# Patient Record
Sex: Male | Born: 1950
Health system: Southern US, Community
[De-identification: ages and names within clinical notes are randomized; demographics above are authoritative.]

## PROBLEM LIST (undated history)

## (undated) DIAGNOSIS — E785 Hyperlipidemia, unspecified: Secondary | ICD-10-CM

## (undated) DIAGNOSIS — I213 ST elevation (STEMI) myocardial infarction of unspecified site: Secondary | ICD-10-CM

## (undated) DIAGNOSIS — R7989 Other specified abnormal findings of blood chemistry: Secondary | ICD-10-CM

## (undated) DIAGNOSIS — F4322 Adjustment disorder with anxiety: Secondary | ICD-10-CM

## (undated) DIAGNOSIS — F1020 Alcohol dependence, uncomplicated: Secondary | ICD-10-CM

## (undated) HISTORY — DX: ST elevation (STEMI) myocardial infarction of unspecified site: I21.3

## (undated) HISTORY — DX: Other specified abnormal findings of blood chemistry: R79.89

## (undated) HISTORY — DX: Adjustment disorder with anxiety: F43.22

## (undated) HISTORY — DX: Hyperlipidemia, unspecified: E78.5

## (undated) HISTORY — DX: Alcohol dependence, uncomplicated: F10.20

---

## 1998-12-04 ENCOUNTER — Ambulatory Visit (HOSPITAL_COMMUNITY): Admission: RE | Admit: 1998-12-04 | Discharge: 1998-12-04 | Payer: Self-pay | Admitting: Gastroenterology

## 2003-12-18 LAB — HM COLONOSCOPY

## 2005-02-26 ENCOUNTER — Emergency Department (HOSPITAL_COMMUNITY): Admission: EM | Admit: 2005-02-26 | Discharge: 2005-02-26 | Payer: Self-pay | Admitting: Family Medicine

## 2005-12-11 ENCOUNTER — Ambulatory Visit: Payer: Self-pay | Admitting: Internal Medicine

## 2006-04-02 ENCOUNTER — Ambulatory Visit: Payer: Self-pay | Admitting: Internal Medicine

## 2006-06-09 ENCOUNTER — Ambulatory Visit: Payer: Self-pay | Admitting: Internal Medicine

## 2006-07-17 ENCOUNTER — Ambulatory Visit: Payer: Self-pay | Admitting: Internal Medicine

## 2006-07-17 LAB — CONVERTED CEMR LAB
Chol/HDL Ratio, serum: 6.9
Cholesterol: 197 mg/dL (ref 0–200)
Triglyceride fasting, serum: 347 mg/dL (ref 0–149)

## 2007-06-16 ENCOUNTER — Ambulatory Visit: Payer: Self-pay | Admitting: Internal Medicine

## 2007-06-16 LAB — CONVERTED CEMR LAB
Blood in Urine, dipstick: NEGATIVE
Ketones, urine, test strip: NEGATIVE
Nitrite: NEGATIVE
Urobilinogen, UA: 0.2

## 2007-06-17 LAB — CONVERTED CEMR LAB
ALT: 36 units/L (ref 0–53)
AST: 29 units/L (ref 0–37)
Alkaline Phosphatase: 45 units/L (ref 39–117)
BUN: 13 mg/dL (ref 6–23)
Basophils Relative: 0.6 % (ref 0.0–1.0)
Bilirubin, Direct: 0.1 mg/dL (ref 0.0–0.3)
Calcium: 9.1 mg/dL (ref 8.4–10.5)
Chloride: 108 meq/L (ref 96–112)
Eosinophils Absolute: 0.1 10*3/uL (ref 0.0–0.6)
Eosinophils Relative: 1.5 % (ref 0.0–5.0)
GFR calc non Af Amer: 93 mL/min
Glucose, Bld: 90 mg/dL (ref 70–99)
HDL: 26.4 mg/dL — ABNORMAL LOW (ref >39.0)
MCV: 91.5 fL (ref 78.0–100.0)
Platelets: 184 10*3/uL (ref 150–400)
RBC: 4.7 M/uL (ref 4.22–5.81)
Triglycerides: 283 mg/dL (ref 0–149)
VLDL: 57 mg/dL — ABNORMAL HIGH (ref 0–40)
WBC: 3.9 10*3/uL — ABNORMAL LOW (ref 4.5–10.5)

## 2007-06-23 ENCOUNTER — Ambulatory Visit: Payer: Self-pay | Admitting: Internal Medicine

## 2007-06-23 DIAGNOSIS — E785 Hyperlipidemia, unspecified: Secondary | ICD-10-CM

## 2007-06-23 DIAGNOSIS — E786 Lipoprotein deficiency: Secondary | ICD-10-CM | POA: Insufficient documentation

## 2007-06-23 DIAGNOSIS — E781 Pure hyperglyceridemia: Secondary | ICD-10-CM

## 2007-09-03 HISTORY — PX: CATARACT EXTRACTION: SUR2

## 2007-09-17 ENCOUNTER — Ambulatory Visit: Payer: Self-pay | Admitting: Internal Medicine

## 2007-09-17 LAB — CONVERTED CEMR LAB
Cholesterol: 208 mg/dL (ref 0–200)
HDL: 26.5 mg/dL — ABNORMAL LOW (ref >39.0)
Total CHOL/HDL Ratio: 7.8
Triglycerides: 226 mg/dL (ref 0–149)
VLDL: 45 mg/dL — ABNORMAL HIGH (ref 0–40)

## 2007-09-25 ENCOUNTER — Ambulatory Visit: Payer: Self-pay | Admitting: Internal Medicine

## 2008-04-19 ENCOUNTER — Ambulatory Visit: Payer: Self-pay | Admitting: Family Medicine

## 2008-06-22 ENCOUNTER — Ambulatory Visit: Payer: Self-pay | Admitting: Internal Medicine

## 2008-06-22 LAB — CONVERTED CEMR LAB
ALT: 34 units/L (ref 0–53)
AST: 30 units/L (ref 0–37)
Basophils Absolute: 0 10*3/uL (ref 0.0–0.1)
Basophils Relative: 0.2 % (ref 0.0–3.0)
Bilirubin, Direct: 0.1 mg/dL (ref 0.0–0.3)
CO2: 32 meq/L (ref 19–32)
CRP, High Sensitivity: <1 — ABNORMAL LOW (ref 0.00–5.00)
Calcium: 9.4 mg/dL (ref 8.4–10.5)
Chloride: 104 meq/L (ref 96–112)
Creatinine, Ser: 0.8 mg/dL (ref 0.4–1.5)
Glucose, Bld: 86 mg/dL (ref 70–99)
Hemoglobin: 15 g/dL (ref 13.0–17.0)
LDL Cholesterol: 132 mg/dL — ABNORMAL HIGH (ref 0–99)
Lymphocytes Relative: 46.4 % — ABNORMAL HIGH (ref 12.0–46.0)
Monocytes Relative: 18.5 % — ABNORMAL HIGH (ref 3.0–12.0)
Neutro Abs: 1.3 10*3/uL — ABNORMAL LOW (ref 1.4–7.7)
Neutrophils Relative %: 34.2 % — ABNORMAL LOW (ref 43.0–77.0)
Nitrite: NEGATIVE
Protein, U semiquant: NEGATIVE
RBC: 4.66 M/uL (ref 4.22–5.81)
Sodium: 142 meq/L (ref 135–145)
Total Bilirubin: 0.7 mg/dL (ref 0.3–1.2)
Total CHOL/HDL Ratio: 6.4
Total Protein: 7.5 g/dL (ref 6.0–8.3)
Urobilinogen, UA: 0.2
VLDL: 31 mg/dL (ref 0–40)
WBC Urine, dipstick: NEGATIVE

## 2008-06-29 ENCOUNTER — Ambulatory Visit: Payer: Self-pay | Admitting: Internal Medicine

## 2008-06-29 DIAGNOSIS — F528 Other sexual dysfunction not due to a substance or known physiological condition: Secondary | ICD-10-CM | POA: Insufficient documentation

## 2008-08-08 ENCOUNTER — Ambulatory Visit: Payer: Self-pay | Admitting: Internal Medicine

## 2008-08-11 LAB — CONVERTED CEMR LAB
Basophils Absolute: 0 10*3/uL (ref 0.0–0.1)
Basophils Relative: 0.1 % (ref 0.0–3.0)
Eosinophils Absolute: 0 10*3/uL (ref 0.0–0.7)
Hemoglobin: 14.9 g/dL (ref 13.0–17.0)
MCHC: 35.2 g/dL (ref 30.0–36.0)
MCV: 92.9 fL (ref 78.0–100.0)
Monocytes Absolute: 0.9 10*3/uL (ref 0.1–1.0)
Neutro Abs: 1.7 10*3/uL (ref 1.4–7.7)
RBC: 4.55 M/uL (ref 4.22–5.81)
RDW: 12.5 % (ref 11.5–14.6)

## 2008-12-05 ENCOUNTER — Ambulatory Visit: Payer: Self-pay | Admitting: Internal Medicine

## 2009-01-04 ENCOUNTER — Ambulatory Visit: Payer: Self-pay | Admitting: Internal Medicine

## 2009-01-04 LAB — CONVERTED CEMR LAB
Albumin: 4 g/dL (ref 3.5–5.2)
Alkaline Phosphatase: 45 units/L (ref 39–117)
BUN: 16 mg/dL (ref 6–23)
Bilirubin Urine: NEGATIVE
CO2: 31 meq/L (ref 19–32)
Chloride: 107 meq/L (ref 96–112)
Cholesterol: 202 mg/dL — ABNORMAL HIGH (ref 0–200)
Creatinine, Ser: 0.9 mg/dL (ref 0.4–1.5)
Glucose, Bld: 84 mg/dL (ref 70–99)
HCT: 45.5 % (ref 39.0–52.0)
Hemoglobin, Urine: NEGATIVE
MCHC: 33.5 g/dL (ref 30.0–36.0)
MCV: 91.1 fL (ref 78.0–100.0)
Nitrite: NEGATIVE
Platelets: 122 10*3/uL — ABNORMAL LOW (ref 150.0–400.0)
Potassium: 4.1 meq/L (ref 3.5–5.1)
TSH: 4.19 microintl units/mL (ref 0.35–5.50)
Total Protein, Urine: NEGATIVE mg/dL
Total Protein: 7.8 g/dL (ref 6.0–8.3)
Urine Glucose: NEGATIVE mg/dL
Urobilinogen, UA: 0.2 (ref 0.0–1.0)
VLDL: 28.4 mg/dL (ref 0.0–40.0)
WBC: 4.1 10*3/uL — ABNORMAL LOW (ref 4.5–10.5)

## 2009-01-09 ENCOUNTER — Ambulatory Visit: Payer: Self-pay | Admitting: Internal Medicine

## 2009-01-12 ENCOUNTER — Encounter: Payer: Self-pay | Admitting: Internal Medicine

## 2009-01-12 LAB — HM COLONOSCOPY: HM Colonoscopy: NORMAL

## 2009-04-18 ENCOUNTER — Ambulatory Visit: Payer: Self-pay | Admitting: Internal Medicine

## 2009-04-26 ENCOUNTER — Inpatient Hospital Stay (HOSPITAL_COMMUNITY): Admission: EM | Admit: 2009-04-26 | Discharge: 2009-04-30 | Payer: Self-pay | Admitting: Emergency Medicine

## 2009-04-26 DIAGNOSIS — I213 ST elevation (STEMI) myocardial infarction of unspecified site: Secondary | ICD-10-CM

## 2009-04-26 HISTORY — DX: ST elevation (STEMI) myocardial infarction of unspecified site: I21.3

## 2009-04-26 HISTORY — PX: CORONARY ANGIOPLASTY WITH STENT PLACEMENT: SHX49

## 2009-05-02 ENCOUNTER — Inpatient Hospital Stay (HOSPITAL_COMMUNITY): Admission: EM | Admit: 2009-05-02 | Discharge: 2009-05-03 | Payer: Self-pay | Admitting: Emergency Medicine

## 2009-05-02 HISTORY — PX: CARDIAC CATHETERIZATION: SHX172

## 2009-05-03 HISTORY — PX: TRANSTHORACIC ECHOCARDIOGRAM: SHX275

## 2009-05-10 ENCOUNTER — Telehealth: Payer: Self-pay | Admitting: *Deleted

## 2009-05-10 LAB — CONVERTED CEMR LAB
HCT: 43.1 % (ref 39.0–52.0)
Hemoglobin: 14.7 g/dL (ref 13.0–17.0)
MCHC: 34 g/dL (ref 30.0–36.0)
Platelets: 126 10*3/uL — ABNORMAL LOW (ref 150.0–400.0)
RDW: 12.6 % (ref 11.5–14.6)

## 2009-05-18 ENCOUNTER — Encounter (HOSPITAL_COMMUNITY): Admission: RE | Admit: 2009-05-18 | Discharge: 2009-08-16 | Payer: Self-pay | Admitting: Cardiovascular Disease

## 2009-06-01 ENCOUNTER — Encounter: Payer: Self-pay | Admitting: Internal Medicine

## 2009-06-13 ENCOUNTER — Ambulatory Visit: Payer: Self-pay | Admitting: Internal Medicine

## 2009-06-13 DIAGNOSIS — F4322 Adjustment disorder with anxiety: Secondary | ICD-10-CM

## 2009-06-13 DIAGNOSIS — Z9861 Coronary angioplasty status: Secondary | ICD-10-CM

## 2009-06-13 DIAGNOSIS — I2510 Atherosclerotic heart disease of native coronary artery without angina pectoris: Secondary | ICD-10-CM

## 2009-06-13 HISTORY — DX: Adjustment disorder with anxiety: F43.22

## 2009-06-26 ENCOUNTER — Telehealth: Payer: Self-pay | Admitting: *Deleted

## 2009-06-27 ENCOUNTER — Telehealth: Payer: Self-pay | Admitting: *Deleted

## 2009-06-28 ENCOUNTER — Telehealth: Payer: Self-pay | Admitting: *Deleted

## 2009-07-13 ENCOUNTER — Encounter (INDEPENDENT_AMBULATORY_CARE_PROVIDER_SITE_OTHER): Payer: Self-pay | Admitting: *Deleted

## 2009-07-20 ENCOUNTER — Ambulatory Visit: Payer: Self-pay | Admitting: Internal Medicine

## 2009-08-14 ENCOUNTER — Encounter: Payer: Self-pay | Admitting: Internal Medicine

## 2009-08-15 ENCOUNTER — Ambulatory Visit: Payer: Self-pay | Admitting: Internal Medicine

## 2009-10-11 ENCOUNTER — Encounter (HOSPITAL_COMMUNITY): Admission: RE | Admit: 2009-10-11 | Discharge: 2010-01-09 | Payer: Self-pay | Admitting: Cardiovascular Disease

## 2009-10-18 ENCOUNTER — Ambulatory Visit: Payer: Self-pay | Admitting: Internal Medicine

## 2009-10-26 ENCOUNTER — Ambulatory Visit: Payer: Self-pay | Admitting: Psychology

## 2009-10-27 ENCOUNTER — Encounter: Payer: Self-pay | Admitting: Internal Medicine

## 2009-11-14 ENCOUNTER — Ambulatory Visit: Payer: Self-pay | Admitting: Psychology

## 2009-11-24 ENCOUNTER — Ambulatory Visit: Payer: Self-pay | Admitting: Internal Medicine

## 2009-11-24 LAB — CONVERTED CEMR LAB
AST: 34 units/L (ref 0–37)
Basophils Absolute: 0 10*3/uL (ref 0.0–0.1)
CO2: 30 meq/L (ref 19–32)
Chloride: 105 meq/L (ref 96–112)
HDL: 47.8 mg/dL (ref >39.00)
LDL Cholesterol: 51 mg/dL (ref 0–99)
Leukocytes, UA: NEGATIVE
Lymphocytes Relative: 38.6 % (ref 12.0–46.0)
Lymphs Abs: 1.3 10*3/uL (ref 0.7–4.0)
Monocytes Relative: 24.6 % — ABNORMAL HIGH (ref 3.0–12.0)
Neutrophils Relative %: 36.2 % — ABNORMAL LOW (ref 43.0–77.0)
Platelets: 100 10*3/uL — ABNORMAL LOW (ref 150.0–400.0)
RDW: 12.6 % (ref 11.5–14.6)
Sodium: 143 meq/L (ref 135–145)
Specific Gravity, Urine: <=1.005 (ref 1.000–1.030)
TSH: 1.85 microintl units/mL (ref 0.35–5.50)
Total Bilirubin: 1 mg/dL (ref 0.3–1.2)
Total CHOL/HDL Ratio: 2
Triglycerides: 57 mg/dL (ref 0.0–149.0)
Urine Glucose: NEGATIVE mg/dL
Urobilinogen, UA: 0.2 (ref 0.0–1.0)
VLDL: 11.4 mg/dL (ref 0.0–40.0)

## 2009-12-05 ENCOUNTER — Ambulatory Visit: Payer: Self-pay | Admitting: Psychology

## 2009-12-13 ENCOUNTER — Ambulatory Visit: Payer: Self-pay | Admitting: Internal Medicine

## 2009-12-13 DIAGNOSIS — R5381 Other malaise: Secondary | ICD-10-CM

## 2009-12-13 DIAGNOSIS — R5383 Other fatigue: Secondary | ICD-10-CM

## 2010-01-03 ENCOUNTER — Ambulatory Visit: Payer: Self-pay | Admitting: Psychology

## 2010-01-16 ENCOUNTER — Ambulatory Visit: Payer: Self-pay | Admitting: Internal Medicine

## 2010-01-16 LAB — CONVERTED CEMR LAB: Testosterone: 276.66 ng/dL — ABNORMAL LOW (ref 350.00–890.00)

## 2010-01-30 LAB — CONVERTED CEMR LAB
HCT: 45.5 % (ref 39.0–52.0)
Lymphs Abs: 1.4 10*3/uL (ref 0.7–4.0)
MCHC: 33.4 g/dL (ref 30.0–36.0)
MCV: 90.5 fL (ref 78.0–100.0)
Monocytes Relative: 25 % — ABNORMAL HIGH (ref 3–12)
Neutro Abs: 1.1 10*3/uL — ABNORMAL LOW (ref 1.7–7.7)
Neutrophils Relative %: 33 % — ABNORMAL LOW (ref 43–77)
RDW: 13.7 % (ref 11.5–15.5)

## 2010-01-31 ENCOUNTER — Telehealth: Payer: Self-pay | Admitting: *Deleted

## 2010-01-31 ENCOUNTER — Ambulatory Visit: Payer: Self-pay | Admitting: Internal Medicine

## 2010-01-31 ENCOUNTER — Encounter (HOSPITAL_COMMUNITY): Admission: RE | Admit: 2010-01-31 | Discharge: 2010-05-01 | Payer: Self-pay | Admitting: Cardiovascular Disease

## 2010-01-31 LAB — CONVERTED CEMR LAB
LH: 1.86 milliintl units/mL (ref 1.50–9.30)
Sex Hormone Binding: 27 nmol/L (ref 13–71)
Testosterone Free: 55.6 pg/mL (ref 47.0–244.0)
Testosterone: 256.4 ng/dL — ABNORMAL LOW (ref 350–890)

## 2010-02-01 ENCOUNTER — Ambulatory Visit: Payer: Self-pay | Admitting: Psychology

## 2010-02-05 ENCOUNTER — Ambulatory Visit: Payer: Self-pay | Admitting: Internal Medicine

## 2010-02-05 DIAGNOSIS — E291 Testicular hypofunction: Secondary | ICD-10-CM

## 2010-02-06 ENCOUNTER — Encounter: Payer: Self-pay | Admitting: Internal Medicine

## 2010-03-08 ENCOUNTER — Ambulatory Visit: Payer: Self-pay | Admitting: Psychology

## 2010-04-10 ENCOUNTER — Telehealth: Payer: Self-pay | Admitting: *Deleted

## 2010-04-12 ENCOUNTER — Encounter: Payer: Self-pay | Admitting: Internal Medicine

## 2010-04-13 ENCOUNTER — Ambulatory Visit: Payer: Self-pay | Admitting: Psychology

## 2010-04-13 ENCOUNTER — Ambulatory Visit: Payer: Self-pay | Admitting: Internal Medicine

## 2010-04-23 LAB — CONVERTED CEMR LAB
Basophils Absolute: 0 10*3/uL (ref 0.0–0.1)
Eosinophils Relative: 0.4 % (ref 0.0–5.0)
HCT: 43 % (ref 39.0–52.0)
Lymphs Abs: 1.6 10*3/uL (ref 0.7–4.0)
MCHC: 34 g/dL (ref 30.0–36.0)
MCV: 92 fL (ref 78.0–100.0)
Monocytes Absolute: 1 10*3/uL (ref 0.1–1.0)
Platelets: 93 10*3/uL — ABNORMAL LOW (ref 150.0–400.0)
RDW: 13.8 % (ref 11.5–14.6)
Vitamin B-12: 896 pg/mL (ref 211–911)

## 2010-04-24 ENCOUNTER — Ambulatory Visit: Payer: Self-pay | Admitting: Internal Medicine

## 2010-04-25 ENCOUNTER — Ambulatory Visit: Payer: Self-pay | Admitting: Internal Medicine

## 2010-05-03 ENCOUNTER — Encounter (HOSPITAL_COMMUNITY)
Admission: RE | Admit: 2010-05-03 | Discharge: 2010-08-01 | Payer: Self-pay | Source: Home / Self Care | Admitting: Cardiovascular Disease

## 2010-05-09 ENCOUNTER — Encounter: Payer: Self-pay | Admitting: Internal Medicine

## 2010-05-10 ENCOUNTER — Encounter: Payer: Self-pay | Admitting: Internal Medicine

## 2010-05-10 LAB — CBC WITH DIFFERENTIAL/PLATELET
BASO%: 0 % (ref 0.0–2.0)
Basophils Absolute: 0 10e3/uL (ref 0.0–0.1)
EOS%: 0.2 % (ref 0.0–7.0)
Eosinophils Absolute: 0 10e3/uL (ref 0.0–0.5)
HCT: 47.3 % (ref 38.4–49.9)
HGB: 16.1 g/dL (ref 13.0–17.1)
LYMPH%: 33.6 % (ref 14.0–49.0)
MCH: 30.6 pg (ref 27.2–33.4)
MCHC: 34 g/dL (ref 32.0–36.0)
MCV: 89.8 fL (ref 79.3–98.0)
MONO#: 0.9 10e3/uL (ref 0.1–0.9)
MONO%: 20.2 % — ABNORMAL HIGH (ref 0.0–14.0)
NEUT#: 2 10e3/uL (ref 1.5–6.5)
NEUT%: 46 % (ref 39.0–75.0)
Platelets: 95 10e3/uL — ABNORMAL LOW (ref 140–400)
RBC: 5.27 10e6/uL (ref 4.20–5.82)
RDW: 13.2 % (ref 11.0–14.6)
WBC: 4.3 10e3/uL (ref 4.0–10.3)
lymph#: 1.4 10e3/uL (ref 0.9–3.3)
nRBC: 0 % (ref 0–0)

## 2010-05-10 LAB — COMPREHENSIVE METABOLIC PANEL
CO2: 29 mEq/L (ref 19–32)
Calcium: 9.4 mg/dL (ref 8.4–10.5)
Chloride: 104 mEq/L (ref 96–112)
Creatinine, Ser: 0.93 mg/dL (ref 0.40–1.50)
Glucose, Bld: 114 mg/dL — ABNORMAL HIGH (ref 70–99)
Sodium: 141 mEq/L (ref 135–145)
Total Bilirubin: 0.6 mg/dL (ref 0.3–1.2)
Total Protein: 7.4 g/dL (ref 6.0–8.3)

## 2010-05-10 LAB — CHCC SMEAR

## 2010-05-16 ENCOUNTER — Ambulatory Visit: Payer: Self-pay | Admitting: Internal Medicine

## 2010-05-22 ENCOUNTER — Ambulatory Visit (HOSPITAL_COMMUNITY): Admission: RE | Admit: 2010-05-22 | Discharge: 2010-05-22 | Payer: Self-pay | Admitting: Oncology

## 2010-05-24 ENCOUNTER — Ambulatory Visit: Payer: Self-pay | Admitting: Psychology

## 2010-06-21 ENCOUNTER — Ambulatory Visit: Payer: Self-pay | Admitting: Psychology

## 2010-07-10 ENCOUNTER — Ambulatory Visit: Payer: Self-pay | Admitting: Internal Medicine

## 2010-07-12 ENCOUNTER — Encounter: Payer: Self-pay | Admitting: Internal Medicine

## 2010-07-12 LAB — CBC WITH DIFFERENTIAL/PLATELET
BASO%: 0 % (ref 0.0–2.0)
Basophils Absolute: 0 10*3/uL (ref 0.0–0.1)
EOS%: 0.5 % (ref 0.0–7.0)
HCT: 45.6 % (ref 38.4–49.9)
HGB: 15.4 g/dL (ref 13.0–17.1)
LYMPH%: 38.9 % (ref 14.0–49.0)
MCH: 30.4 pg (ref 27.2–33.4)
MCHC: 33.8 g/dL (ref 32.0–36.0)
MONO#: 0.9 10*3/uL (ref 0.1–0.9)
NEUT%: 36.7 % — ABNORMAL LOW (ref 39.0–75.0)
Platelets: 89 10*3/uL — ABNORMAL LOW (ref 140–400)

## 2010-08-02 ENCOUNTER — Encounter (HOSPITAL_COMMUNITY)
Admission: RE | Admit: 2010-08-02 | Discharge: 2010-10-02 | Payer: Self-pay | Source: Home / Self Care | Attending: Cardiovascular Disease | Admitting: Cardiovascular Disease

## 2010-08-15 ENCOUNTER — Encounter: Payer: Self-pay | Admitting: Internal Medicine

## 2010-08-18 DIAGNOSIS — E785 Hyperlipidemia, unspecified: Secondary | ICD-10-CM | POA: Insufficient documentation

## 2010-09-19 ENCOUNTER — Encounter: Payer: Self-pay | Admitting: Internal Medicine

## 2010-10-02 NOTE — Progress Notes (Signed)
Summary: Still having same sx. would like to move appts up if possible  Phone Note Call from Patient Call back at [phone]   Caller: Patient Summary of Call: Pt stating that he is still not feeling good. Still having the fatigue and same symptoms as back in June. Pt does have lab appointment at the end of Aug and follow up in Sept. Pt is going to have a stress test on 8.11. He is wanting to labs and rov moved up if possible. Pt is unsure if it could be stress or anxiety. But thinks that the androgel should have been working by now and he doesn't feel like it is. Initial call taken by: Romualdo Bolk, CMA Duncan Dull),  April 10, 2010 3:42 PM  Follow-up for Phone Call        ok to move up lab appt     Follow-up by: Madelin Headings MD,  April 11, 2010 11:13 AM  Additional Follow-up for Phone Call Additional follow up Details #1::        Left message on machine that Dr. Fabian Sharp said to move up appt.  Additional Follow-up by: Romualdo Bolk, CMA (AAMA),  April 11, 2010 2:02 PM

## 2010-10-02 NOTE — Assessment & Plan Note (Signed)
Summary: 5 month rov/njr   Vital Signs:  Patient profile:   60 year old male Weight:      190 pounds Pulse rate:   60 / minute BP sitting:   110 / 60  (left arm) Cuff size:   regular  Vitals Entered By: Romualdo Bolk, CMA (AAMA) (May 16, 2010 2:54 PM) CC: follow-up visit-Pt wants to discuss going on viibryol    History of Present Illness: Jon Carter comes in today    for follow up of problems  .  thrombocytopenia leukopenia:   now off  is off effient.    off a week  now.    had a normal stress test.    and to see if  laboratory abnormalities  continues.   [name] is pending  to check spleen.   Felt  blood changes could be from medications.   To follow .     Still tired     continues to exercise...   seeing Dr.  Dellia Cloud.    Sluggish   yesterday.   feels better today. A friend suggested he try a new antidepression medicine called velazodone.  he had difficulty with Lexapro and Niaspan with Flushing and nausea. His anxiety continues is using Clonopin with some help.  he gets twinges of pain in his chest that are probably musculoskeletal but it still bothers him and make shims anxious.   Preventive Screening-Counseling & Management  Alcohol-Tobacco     Alcohol drinks/day: <1     Alcohol type: beer, wine     Smoking Status: never  Caffeine-Diet-Exercise     Caffeine use/day: 2     Does Patient Exercise: yes     Type of exercise: wts, jogging, treadmill     Times/week: 7  Current Medications (verified): 1)  Clonazepam 1 Mg Tabs (Clonazepam) .... 1/2  By Mouth Two Times A Day As Needed 2)  Famotidine 20 Mg Tabs (Famotidine) .Marland Kitchen.. 1 By Mouth Qam 3)  Doxycycline Hyclate 100 Mg Tabs (Doxycycline Hyclate) .Marland Kitchen.. 1 By Mouth Once Daily- Every Other Day 4)  Metrogel 1 % Gel (Metronidazole) 5)  Aspirin 81 Mg Tbec (Aspirin) 6)  Niaspan 1000 Mg Cr-Tabs (Niacin (Antihyperlipidemic)) .Marland Kitchen.. 1 By Mouth At Bedtime 7)  Crestor 10 Mg Tabs (Rosuvastatin Calcium) .Marland Kitchen.. 1 By Mouth Once  Daily 8)  Nitrostat 0.4 Mg Subl (Nitroglycerin) 9)  Vitamin D3 1000 Unit Caps (Cholecalciferol) 10)  Co Q-10 150 Mg Caps (Coenzyme Q10) 11)  Androgel Pump 1 % Gel (Testosterone) .... 2 Pumps Per Day  Allergies (verified): 1)  Lexapro (Escitalopram Oxalate)  Past History:  Past medical, surgical, family and social histories (including risk factors) reviewed, and no changes noted (except as noted below).  Past Medical History: Reviewed history from 12/13/2009 and no changes required. Hyperlipidemia hx of low HDL  zero CAC in 2000 at [facility] see paper chart STEMI MI left circumflex DE stent   August 2010 nl stress test  Anxiety Rosacea        LAST Td: 10/09 Colonscopy: 5 years ago- due in April 2010 EKG: 06/08/06  CONSULTANTS  [facility]  Past Surgical History: Reviewed history from 06/29/2008 and no changes required. cataract left eye  [place]   2009  Past History:  Care Management: Dermatology: Dr. Emily Filbert Gastroenterology: [facility] Cardiology: [facility]  Dellia Cloud Hematology : [facility]  Family History: Reviewed history from 12/13/2009 and no changes required. see paper chart Family History of Alcoholism/Addiction Mom 70.s sudden death Brother  overweight    HBP   alcohol Father tongue cancer   tobacco     Social History: Reviewed history from 04/24/2010 and no changes required. 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    Physical Exam  General:  Well-developed,well-nourished,in no acute distress; alert,appropriate and cooperative throughout examination Head:  normocephalic and atraumatic.   Eyes:  vision grossly  intact and pupils equal.   Neck:  No deformities, masses, or tenderness noted. Lungs:  normal respiratory effort, no intercostal retractions, no accessory muscle use, and normal breath sounds.   Heart:  normal rate, regular rhythm, and no murmur.   Skin:  turgor normal, color normal, and no ecchymoses.   Cervical Nodes:  No lymphadenopathy noted Psych:  Oriented X3, memory intact for recent and remote, normally interactive, good eye contact, not depressed appearing, and slightly anxious.  looks a bit tired cognition appears to be normal as well as speech   Impression & Recommendations:  Problem # 1:  MALAISE AND FATIGUE (ICD-780.79) disc about velazodone a new med  still with serontonin  qualitites .    will check into drug interactions  the lexapro seemed to cause nausea and flushes wheil  on niaspan.     poss lonipen causing drowsiness .  consider change med in the future  to short active again as is doing better   Problem # 2:  LEUKOPENIA, MILD WITH BORDERLINE PLATELETS (ICD-288.50) posss med effect  as previously discussed. Hematology is following .  Problem # 3:  ADJUSTMENT DISORDER WITH ANXIETY (ICD-309.24) some possible depression secondary could be causing decrease in his energy he had a sec [place] side effect of the serotonin medication and the newer medication certainly have serotonin qualities however this may be an drug interaction will will have him discuss with [name] and will check on drug interactions for him although he is not on anywhere near as many medications as he was before.   Discuss  unknown risk of taking new medications on the market.  handout was given today  Problem # 4:  HYPOGONADISM (ICD-257.2) on 3 pumps per day .would be due for repeat laboratory test in November.  Complete Medication List: 1)  Clonazepam 1 Mg Tabs (Clonazepam) .... 1/2  by mouth two times a day as needed 2)  Famotidine 20 Mg Tabs (Famotidine) .Marland Kitchen.. 1 by mouth qam 3)  Doxycycline  Hyclate 100 Mg Tabs (Doxycycline hyclate) .Marland Kitchen.. 1 by mouth once daily- every other day 4)  Metrogel 1 % Gel (Metronidazole) 5)  Aspirin 81 Mg Tbec (Aspirin) 6)  Niaspan 1000 Mg Cr-tabs (Niacin (antihyperlipidemic)) .Marland Kitchen.. 1 by mouth at bedtime 7)  Crestor 10 Mg Tabs (Rosuvastatin calcium) .Marland Kitchen.. 1 by mouth once daily 8)  Nitrostat 0.4 Mg Subl (Nitroglycerin) 9)  Vitamin D3 1000 Unit Caps (Cholecalciferol) 10)  Co Q-10 150 Mg Caps (Coenzyme q10) 11)  Androgel Pump 1 % Gel (Testosterone) .... 2 pumps per day  Other Orders: Admin 1st Vaccine (16109) Flu Vaccine 14yrs + (60454)  Patient Instructions: 1)  meet with Dr Dellia Cloud about advisability about antidepressant.  2)   call and then can   call in med or  add med.  and plan follow up   Flu Vaccine Consent Questions     Do you have a history of severe allergic reactions to this vaccine? no    Any prior history of allergic reactions to egg and/or gelatin? no    Do you have a sensitivity to the preservative Thimersol? no    Do you have a past history of Guillan-Barre Syndrome? no    Do you currently have an acute febrile illness? no    Have you ever had a severe reaction to latex? no    Vaccine information given and explained to patient? yes    Are you currently pregnant? no    Lot Number:[id number]   Exp Date:03/02/2011   Site Given  Left Deltoid IMlu Romualdo Bolk, CMA (AAMA)  May 16, 2010 2:57 PM

## 2010-10-02 NOTE — Medication Information (Signed)
Summary: Coverage Approval for Androgel  Coverage Approval for Androgel   Imported By: Maryln Gottron 02/12/2010 10:48:23  _____________________________________________________________________  External Attachment:    Type:   Image     Comment:   External Document

## 2010-10-02 NOTE — Assessment & Plan Note (Signed)
Summary: ?ear inf/pain/cjr   Vital Signs:  Patient profile:   60 year old male Height:      71.25 inches Weight:      192 pounds BMI:     26.69 Temp:     98.3 degrees F oral Pulse rate:   66 / minute BP sitting:   120 / 80  (right arm) Cuff size:   regular  Vitals Entered By: Romualdo Bolk, CMA (AAMA) (October 18, 2009 11:19 AM) CC: Left ear soreness x 1.5 -2 weeks on the outside.   History of Present Illness: Jon Carter     comes  in today for   above  problem  .    about 2 weeks ago  he had insidious onset of a soreness of the left ear  external to touch and  then radiated down jaw line .   NO trauma fver or uri.Did have dental check as has implants on that side . tends to grind teeth also  . NO rash . Ocass uses q tips  no discharge or bleeding.  No cv pulm signs   Anxiety ;   began seeing Dr Dellia Cloud.   to help CAD : now off a lot of meds and continug to exercise.  Lipids : no change was worried as father  s tongue cancer presented as initial ear jaw pain.   Preventive Screening-Counseling & Management  Alcohol-Tobacco     Alcohol drinks/day: <1     Alcohol type: beer, wine     Smoking Status: never  Caffeine-Diet-Exercise     Caffeine use/day: 2     Does Patient Exercise: yes     Type of exercise: wts, jogging, treadmill     Times/week: 7  Current Medications (verified): 1)  Lovaza 1 Gm  Caps (Omega-3-Acid Ethyl Esters) .... 2 By Mouth Two Times A Day or As Directed 2)  Clonazepam 1 Mg Tabs (Clonazepam) .Marland Kitchen.. 1 By Mouth Two Times A Day 3)  Famotidine 20 Mg Tabs (Famotidine) .Marland Kitchen.. 1 By Mouth Qam 4)  Effient 10 Mg Tabs (Prasugrel Hcl) .Marland Kitchen.. 1 By Mouth Qam 5)  Doxycycline Hyclate 100 Mg Tabs (Doxycycline Hyclate) .Marland Kitchen.. 1 By Mouth Once Daily 6)  Metrogel 1 % Gel (Metronidazole) 7)  Aspirin 81 Mg Tbec (Aspirin) 8)  Niaspan 500 Mg Cr-Tabs (Niacin (Antihyperlipidemic)) .... 2  By Mouth At Bedtime 9)  Crestor 10 Mg Tabs (Rosuvastatin Calcium) .Marland Kitchen.. 1 By Mouth Once  Daily 10)  Nitrostat 0.4 Mg Subl (Nitroglycerin) 11)  Multivitamins   Tabs (Multiple Vitamin) 12)  Vitamin D3 1000 Unit Caps (Cholecalciferol)  Allergies (verified): 1)  Lexapro (Escitalopram Oxalate)  Past History:  Past medical, surgical, family and social histories (including risk factors) reviewed, and no changes noted (except as noted below).  Past Medical History: Reviewed history from 06/13/2009 and no changes required. Hyperlipidemia see paper chart STEMI MI left circumflex DE stent  2010 Anxiety       LAST Td: 10/09 Colonscopy: 5 years ago- due in April 2010 EKG: 06/08/06  [facility]ULT[facility]  [facility]  Past Surgical History: Reviewed history from 06/29/2008 and no changes required. cataract left eye  [place]   2009  Past History:  Care Management: Dermatology: Dr. Emily Filbert Gastroenterology: [facility] Cardiology: [facility]  Dellia Cloud  Family History: Reviewed history from 06/29/2008 and no changes required. see paper chart Family History of Alcoholism/Addiction Mom 70.s sudden death Brother   overweight    HBP   alcohol Father tongue cancer  tobacco    Social History: Reviewed history from 12/05/2008 and no changes required. 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    Physical Exam  General:  Well-developed,well-nourished,in no acute distress; alert,appropriate and cooperative throughout examination Head:  normocephalic and atraumatic.   Eyes:  vision grossly intact, pupils equal, and pupils round.   Ears:  tms clear   left eac minimal redness no rassh or fb  slight tenderness at  tragus  pull and also at tmj but no click .  pinna slightly red but no edema or rash  poss from  manipulation Nose:  no external deformity, no external erythema, and no nasal discharge.   Mouth:  pharynx pink and moist.   Neck:  No deformities, masses, or tenderness noted. no bruits   Lungs:  normal respiratory effort and no intercostal retractions.   Heart:  normal rate, regular rhythm, and no murmur.   Neurologic:  alert & oriented X3, cranial nerves II-XII intact, strength normal in all extremities, and gait normal.   Cervical Nodes:  no anterior cervical adenopathy and no posterior cervical adenopathy.   Psych:  Oriented X3, good eye contact, not depressed appearing, and slightly anxious.   nl cognition     Impression & Recommendations:  Problem # 1:  OTALGIA (ICD-388.70)  suspect referred  poss tmj or could have had a mild esternla canal irritation  with q tips .   Do not think this is vascular .     and no other findings.    suggest   relative jaw rest and cautious aleve short term .     if persistent and progressive     .  can see dentisit   and then  [name] as needed.   His updated medication list for this problem includes:    Doxycycline Hyclate 100 Mg Tabs (Doxycycline hyclate) .Marland Kitchen... 1 by mouth once daily  Problem # 2:  ADJUSTMENT DISORDER WITH ANXIETY (ICD-309.24) seeing Dr Dellia Cloud and encouraged to continue counseled   Problem # 3:  CAD (ICD-414.00) doing well  in exercise and rehab.  The following medications were removed from the medication list:    Bystolic 5 Mg Tabs (Nebivolol hcl) .Marland Kitchen... 1 by mouth two times a day His updated medication list for this problem includes:    Effient 10 Mg Tabs (Prasugrel hcl) .Marland Kitchen... 1 by mouth qam    Aspirin 81 Mg Tbec (Aspirin)    Nitrostat 0.4 Mg Subl (Nitroglycerin)  Problem # 4:  HYPERLIPIDEMIA (ICD-272.4)  His updated medication list for this problem includes:    Lovaza 1 Gm Caps (Omega-3-acid ethyl esters) .Marland Kitchen... 2 by mouth two times a day or as directed    Niaspan 500 Mg Cr-tabs  (Niacin (antihyperlipidemic)) .Marland Kitchen... 2  by mouth at bedtime    Crestor 10 Mg Tabs (Rosuvastatin calcium) .Marland Kitchen... 1 by mouth once daily  Labs Reviewed: SGOT: 34 (01/04/2009)   SGPT: 41 (01/04/2009)   HDL:31.40 (01/04/2009), 30.2 (06/22/2008)  LDL:132 (06/22/2008), DEL (09/17/2007)  Chol:202 (01/04/2009), 193 (06/22/2008)  Trig:142.0 (01/04/2009), 155 (06/22/2008)  Complete Medication List: 1)  Lovaza 1 Gm Caps (Omega-3-acid ethyl esters) .... 2 by mouth two times a day or as directed 2)  Clonazepam 1 Mg Tabs (Clonazepam) .Marland Kitchen.. 1 by mouth two times a day 3)  Famotidine 20 Mg Tabs (Famotidine) .Marland Kitchen.. 1 by mouth qam 4)  Effient 10 Mg Tabs (Prasugrel hcl) .Marland Kitchen.. 1 by mouth qam 5)  Doxycycline Hyclate 100 Mg Tabs (Doxycycline hyclate) .Marland Kitchen.. 1 by mouth once daily 6)  Metrogel 1 % Gel (Metronidazole) 7)  Aspirin 81 Mg Tbec (Aspirin) 8)  Niaspan 500 Mg Cr-tabs (Niacin (antihyperlipidemic)) .... 2  by mouth at bedtime 9)  Crestor 10 Mg Tabs (Rosuvastatin calcium) .Marland Kitchen.. 1 by mouth once daily 10)  Nitrostat 0.4 Mg Subl (Nitroglycerin) 11)  Multivitamins Tabs (Multiple vitamin) 12)  Vitamin D3 1000 Unit Caps (Cholecalciferol)  Patient Instructions: 1)  monitor signs and advil aleve  jaw rest  2)  consider follow up with dentist or then [name] or if rash  or persistent and progressive  signs.  greater than 50% of visit spent in counseling   25 minutes

## 2010-10-02 NOTE — Assessment & Plan Note (Signed)
Summary: cpx/njr rsc bmp/njr   Vital Signs:  Patient profile:   60 year old male Height:      71.25 inches Weight:      186 pounds Pulse rate:   60 / minute BP sitting:   110 / 70  (left arm) Cuff size:   regular  Vitals Entered By: Romualdo Bolk, CMA Duncan Dull) (December 13, 2009 2:21 PM) CC: CPX   History of Present Illness: Jon Carter comes in today for   preventive visit . Since last visit  here  there have been no major changes in health status  has been doing ok.  in counseling and less anxious and more confident .   continues  exercise and is  Down to clonazepan to 1/2 two times a day doing better.  considering other wean.   CAD: no signs to see Dr Tresa Endo in August . no signs  Few bruisis on meds but no active bleeding  fever sweats .  Eye rosacea doing ok.  Began co q  10 for energy and hear t health rec by neighbor.   Preventive Care Screening  Colonoscopy:    Date:  01/12/2009    Results:  normal   Prior Values:    PSA:  0.53 (11/24/2009)    Last Tetanus Booster:  Tdap (04/19/2008)    Last Flu Shot:  Fluvax Non-MCR (06/23/2007)   Preventive Screening-Counseling & Management  Alcohol-Tobacco     Alcohol drinks/day: <1     Alcohol type: beer, wine     Smoking Status: never  Caffeine-Diet-Exercise     Caffeine use/day: 2     Does Patient Exercise: yes     Type of exercise: wts, jogging, treadmill     Times/week: 7  Hep-HIV-STD-Contraception     Dental Visit-last 6 months yes     Sun Exposure-Excessive: no  Safety-Violence-Falls     Seat Belt Use: 100     Smoke Detectors: yes  Current Medications (verified): 1)  Clonazepam 1 Mg Tabs (Clonazepam) .... 1/2  By Mouth Two Times A Day 2)  Famotidine 20 Mg Tabs (Famotidine) .Marland Kitchen.. 1 By Mouth Qam 3)  Effient 10 Mg Tabs (Prasugrel Hcl) .Marland Kitchen.. 1 By Mouth Qam 4)  Doxycycline Hyclate 100 Mg Tabs (Doxycycline Hyclate) .Marland Kitchen.. 1 By Mouth Once Daily 5)  Metrogel 1 % Gel (Metronidazole) 6)  Aspirin 81 Mg Tbec  (Aspirin) 7)  Niaspan 500 Mg Cr-Tabs (Niacin (Antihyperlipidemic)) .... 2  By Mouth At Bedtime 8)  Crestor 10 Mg Tabs (Rosuvastatin Calcium) .Marland Kitchen.. 1 By Mouth Once Daily 9)  Nitrostat 0.4 Mg Subl (Nitroglycerin) 10)  Multivitamins   Tabs (Multiple Vitamin) 11)  Vitamin D3 1000 Unit Caps (Cholecalciferol) 12)  Co Q-10 150 Mg Caps (Coenzyme Q10)  Allergies (verified): 1)  Lexapro (Escitalopram Oxalate)  Past History:  Past medical, surgical, family and social histories (including risk factors) reviewed, and no changes noted (except as noted below).  Past Medical History: Hyperlipidemia hx of low HDL  [facility] in 2000 [facility] see paper chart STEMI MI left circumflex DE stent   August 2010 nl stress test  Anxiety Rosacea        LAST Td: 10/09 Colonscopy: 5 years ago- due in April 2010 EKG: 06/08/06  CONSULTANTS  Elmon Else derm [facility]off  Past Surgical History: Reviewed history from 06/29/2008 and no changes required. cataract left eye  [place]   2009  Past History:  Care Management: Dermatology: Dr. Emily Filbert Gastroenterology: [facility] Cardiology: Tresa Endo Psychology  [place]  Family History: Reviewed history from 10/18/2009 and no changes required. see paper chart Family History of Alcoholism/Addiction Mom 70.s sudden death Brother   overweight    HBP   alcohol Father tongue cancer   tobacco     Social History: Reviewed history from 10/18/2009 and no changes required. 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         ocass left jaw pain pss from muscle and teeht grinding   doing better  no obs prostate symptom some  fatigue   .  ? cause  not worse   Physical Exam General Appearance: well developed, well nourished, no acute distress Eyes: conjunctiva and lids normal, PERRLA, EOMI, fundi WNL Ears, Nose, Mouth, Throat: TM clear, nares clear, oral exam WNL Neck: supple, no lymphadenopathy, no thyromegaly, no JVD Respiratory: clear to auscultation and percussion, respiratory effort normal Cardiovascular: regular rate and rhythm, S1-S2, no murmur, rub or gallop, no bruits, peripheral pulses normal and symmetric, no cyanosis, clubbing, edema or varicosities Chest: no scars, masses, tenderness; no asymmetry, skin changes, nipple discharge, no gynecomastia   Gastrointestinal: soft, non-tender; no hepatosplenomegaly, masses; active bowel sounds all quadrants, guaiac negative stool; no masses, tenderness, hemorrhoids  Genitourinary: 1 + no nodule   prostate enlargement Lymphatic: no cervical, axillary or inguinal adenopathy Musculoskeletal: gait normal, muscle tone and strength WNL, no joint swelling, effusions, discoloration, crepitus  Skin: clear, good turgor, color WNL, no rashes, lesions, or ulcerations  one bruise left arm no petechia  Neurologic: normal mental status, normal reflexes, normal strength, sensation, and motion Psychiatric: alert; oriented to person, place and time Other Exam: see labs  EKG done by cards     Impression & Recommendations:  Problem # 1:  PREVENTIVE HEALTH CARE (ICD-V70.0) continue healthy lifestyle intervention     doing well  pneumovax  today   Problem # 2:  ADJUSTMENT DISORDER WITH ANXIETY (ICD-309.24) Assessment: Improved in counseling and adapting.   like ptsd and coping  Problem # 3:  CAD  (ICD-414.00) Assessment: Unchanged  His updated medication list for this problem includes:    Effient 10 Mg Tabs (Prasugrel hcl) .Marland Kitchen... 1 by mouth qam    Aspirin 81 Mg Tbec (Aspirin)    Nitrostat 0.4 Mg Subl (Nitroglycerin)  Problem # 4:  POSTSURG PERCUT TRANSLUMINAL COR ANGPLSTY STS (ICD-V45.82)  His updated medication list for this problem includes:    Effient 10 Mg Tabs (Prasugrel hcl) .Marland Kitchen... 1 by mouth qam    Aspirin 81 Mg Tbec (Aspirin)    Nitrostat 0.4 Mg Subl (Nitroglycerin)  Problem # 5:  LEUKOPENIA, MILD WITH BORDERLINE PLATELETS (ICD-288.50) poss from the medication  no symptom at present. renal function good and ANC 1160 and adequate.   Problem # 6:  MALAISE AND FATIGUE (ICD-780.79) better  and has many causes but asks about getting a testosterone level    . can do this with next blood test.   Problem # 7:  HYPERLIPIDEMIA (  ICD-272.4)  The following medications were removed from the medication list:    Lovaza 1 Gm Caps (Omega-3-acid ethyl esters) .Marland Kitchen... 2 by mouth two times a day or as directed His updated medication list for this problem includes:    Niaspan 500 Mg Cr-tabs (Niacin (antihyperlipidemic)) .Marland Kitchen... 2  by mouth at bedtime    Crestor 10 Mg Tabs (Rosuvastatin calcium) .Marland Kitchen... 1 by mouth once daily  Complete Medication List: 1)  Clonazepam 1 Mg Tabs (Clonazepam) .... 1/2  by mouth two times a day 2)  Famotidine 20 Mg Tabs (Famotidine) .Marland Kitchen.. 1 by mouth qam 3)  Effient 10 Mg Tabs (Prasugrel hcl) .Marland Kitchen.. 1 by mouth qam 4)  Doxycycline Hyclate 100 Mg Tabs (Doxycycline hyclate) .Marland Kitchen.. 1 by mouth once daily 5)  Metrogel 1 % Gel (Metronidazole) 6)  Aspirin 81 Mg Tbec (Aspirin) 7)  Niaspan 500 Mg Cr-tabs (Niacin (antihyperlipidemic)) .... 2  by mouth at bedtime 8)  Crestor 10 Mg Tabs (Rosuvastatin calcium) .Marland Kitchen.. 1 by mouth once daily 9)  Nitrostat 0.4 Mg Subl (Nitroglycerin) 10)  Multivitamins Tabs (Multiple vitamin) 11)  Vitamin D3 1000 Unit Caps (Cholecalciferol) 12)  Co Q-10  150 Mg Caps (Coenzyme q10)  Other Orders: Pneumococcal Vaccine (24401) Admin 1st Vaccine (02725)  Patient Instructions: 1)  Continue healthy lifestyle .  2)  recheck cbc  diff platelets , AM   testosterone in 1-2 months   follow up as needed . 3)  otherwise .  dx  . 288. , fatigue  4)  ROV  after you see Dr Tresa Endo in August or as needed.    Immunizations Administered:  Pneumonia Vaccine:    Vaccine Type: Pneumovax    Site: right deltoid    Mfr: Merck    Dose: 0.5 ml    Route: IM    Given by: Romualdo Bolk, CMA (AAMA)    Exp. Date: 04/07/2011    Lot #: [id number]

## 2010-10-02 NOTE — Letter (Signed)
Summary: [facility]  [facility]   Imported By: Maryln Gottron 05/30/2010 13:09:18  _____________________________________________________________________  External Attachment:    Type:   Image     Comment:   External Document

## 2010-10-02 NOTE — Assessment & Plan Note (Signed)
Summary: follow up/ssc   Vital Signs:  Patient profile:   60 year old male Weight:      187 pounds Pulse rate:   60 / minute BP sitting:   130 / 80  (right arm) Cuff size:   regular  Vitals Entered By: Romualdo Bolk, CMA (AAMA) (February 05, 2010 11:01 AM) CC: Follow-up visit on labs    History of Present Illness: Jon Carter comes in today  for follow up of labs done as a results of his multiple medical problems and fatigue and sleepiness   n the day.  He is unclear if all his fatigue if from stress and rcovering from heart attack or other. He is now seeing Dr Dellia Cloud in counseling with help.   Cardiac rehab  as expected. No new signs .    Preventive Screening-Counseling & Management  Alcohol-Tobacco     Alcohol drinks/day: <1     Alcohol type: beer, wine     Smoking Status: never  Caffeine-Diet-Exercise     Caffeine use/day: 2     Does Patient Exercise: yes     Type of exercise: wts, jogging, treadmill     Times/week: 7  Current Medications (verified): 1)  Clonazepam 1 Mg Tabs (Clonazepam) .... 1/2  By Mouth Two Times A Day As Needed 2)  Famotidine 20 Mg Tabs (Famotidine) .Marland Kitchen.. 1 By Mouth Qam 3)  Effient 10 Mg Tabs (Prasugrel Hcl) .Marland Kitchen.. 1 By Mouth Qam 4)  Doxycycline Hyclate 100 Mg Tabs (Doxycycline Hyclate) .Marland Kitchen.. 1 By Mouth Once Daily- Every Other Day 5)  Metrogel 1 % Gel (Metronidazole) 6)  Aspirin 81 Mg Tbec (Aspirin) 7)  Niaspan 1000 Mg Cr-Tabs (Niacin (Antihyperlipidemic)) .Marland Kitchen.. 1 By Mouth At Bedtime 8)  Crestor 10 Mg Tabs (Rosuvastatin Calcium) .Marland Kitchen.. 1 By Mouth Once Daily 9)  Nitrostat 0.4 Mg Subl (Nitroglycerin) 10)  Boost  Liqd (Nutritional Supplements) 11)  Vitamin D3 1000 Unit Caps (Cholecalciferol) 12)  Co Q-10 150 Mg Caps (Coenzyme Q10)  Allergies (verified): 1)  Lexapro (Escitalopram Oxalate)  Past History:  Past medical, surgical, family and social histories (including risk factors) reviewed, and no changes noted (except as noted below).  Past  Medical History: Reviewed history from 12/13/2009 and no changes required. Hyperlipidemia hx of low HDL  zero [facility] in 2000 at [facility] see paper chart STEMI MI left circumflex DE stent   August 2010 nl stress test  Anxiety Rosacea        LAST Td: 10/09 Colonscopy: 5 years ago- due in April 2010 EKG: 06/08/06  CONSULTANTS  El[facility] Else derm [facility]  Past Surgical History: Reviewed history from 06/29/2008 and no changes required. cataract left eye  [place]   2009  Past History:  Care Management: Dermatology: Dr. Emily Filbert Gastroenterology: Kinnie Scales Cardiology: Tresa Endo Psychology  Dellia Cloud  Family History: Reviewed history from 12/13/2009 and no changes required. see paper chart Family History of Alcoholism/Addiction Mom 70.s sudden death Brother   overweight    HBP   alcohol Father tongue cancer   tobacco     Social History: Reviewed history from 12/13/2009 and no changes required. 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    Physical Exam  General:  Well-developed,well-nourished,in no acute distress; alert,appropriate and cooperative throughout examination Head:  normocephalic and atraumatic.   Psych:  Oriented X3, good eye contact, and not depressed appearing.  mmildly anxious more relaxed    Impression & Recommendations:  Problem # 1:  HYPOGONADISM (ICD-257.2) Assessment New with signs of fatigue .  nl weight and no opiates or drugs related . no neuro findings .   optin to rx and will begin with androgel topicals  lower dose and follow up ( he is going out of town soon.   Problem #  2:  MALAISE AND FATIGUE (ICD-780.79) Assessment: Comment Only  Problem # 3:  MYOCARDIAL INFARCTION, HX OFSTEMI (ICD-412)  His updated medication list for this problem includes:    Effient 10 Mg Tabs (Prasugrel hcl) .Marland Kitchen... 1 by mouth qam    Aspirin 81 Mg Tbec (Aspirin)    Nitrostat 0.4 Mg Subl (Nitroglycerin)  Problem # 4:  ADJUSTMENT DISORDER WITH ANXIETY (ICD-309.24) Assessment: Comment Only  Problem # 5:  ERECTILE DYSFUNCTION, MILD (ICD-302.72) Assessment: Comment Only  Complete Medication List: 1)  Clonazepam 1 Mg Tabs (Clonazepam) .... 1/2  by mouth two times a day as needed 2)  Famotidine 20 Mg Tabs (Famotidine) .Marland Kitchen.. 1 by mouth qam 3)  Effient 10 Mg Tabs (Prasugrel hcl) .Marland Kitchen.. 1 by mouth qam 4)  Doxycycline Hyclate 100 Mg Tabs (Doxycycline hyclate) .Marland Kitchen.. 1 by mouth once daily- every other day 5)  Metrogel 1 % Gel (Metronidazole) 6)  Aspirin 81 Mg Tbec (Aspirin) 7)  Niaspan 1000 Mg Cr-tabs (Niacin (antihyperlipidemic)) .Marland Kitchen.. 1 by mouth at bedtime 8)  Crestor 10 Mg Tabs (Rosuvastatin calcium) .Marland Kitchen.. 1 by mouth once daily 9)  Nitrostat 0.4 Mg Subl (Nitroglycerin) 10)  Boost Liqd (Nutritional supplements) 11)  Vitamin D3 1000 Unit Caps (Cholecalciferol) 12)  Co Q-10 150 Mg Caps (Coenzyme q10) 13)  Androgel Pump 1 % Gel (Testosterone) .... 2 pumps per day  Patient Instructions: 1)  begin androgel  daily   2)  Testosterone level, PSA,  CBC diff , B12 Folic acid level 3)  in 3 months and then ROV.  Prescriptions: ANDROGEL PUMP 1 % GEL (TESTOSTERONE) 2 pumps per day  #1 month x 3   Entered and Authorized by:   Madelin Headings MD   Signed by:   Madelin Headings MD on 02/05/2010   Method used:   Print then Give to Patient   RxID:   [id number]  greater than 50% of visit spent in counseling  25 minutes

## 2010-10-02 NOTE — Letter (Signed)
Summary: [facility]  [facility]   Imported By: Maryln Gottron 06/13/2010 10:27:18  _____________________________________________________________________  External Attachment:    Type:   Image     Comment:   External Document

## 2010-10-02 NOTE — Miscellaneous (Signed)
Summary: [facility]  [facility]   Imported By: Maryln Gottron 09/22/2009 15:21:00  _____________________________________________________________________  External Attachment:    Type:   Image     Comment:   External Document

## 2010-10-02 NOTE — Progress Notes (Signed)
Summary: lab results  Phone Note Call from Patient Call back at Home Phone [phone] Call back at [phone]   Caller: Patient Summary of Call: Pt is going to be going out of town in 2 weeks. He would like to have a follow up lab appt next week if he needs one. Can we go ahead and schedule this or do you want to wait? Initial call taken by: Romualdo Bolk, CMA (AAMA),  January 31, 2010 5:12 PM  Follow-up for Phone Call        results not all back but go ahead and put him on the schedule for next week. Follow-up by: Madelin Headings MD,  February 01, 2010 7:43 AM  Additional Follow-up for Phone Call Additional follow up Details #1::        Left message to call back to confirm appt on 6/6 at 11am. Additional Follow-up by: Romualdo Bolk, CMA Duncan Dull),  February 01, 2010 9:42 AM    Additional Follow-up for Phone Call Additional follow up Details #2::    Pt called back and confirmed appt for monday. Follow-up by: Romualdo Bolk, CMA (AAMA),  February 02, 2010 9:52 AM

## 2010-10-02 NOTE — Letter (Signed)
Summary: [facility]  [facility]   Imported By: Maryln Gottron 07/20/2010 14:26:05  _____________________________________________________________________  External Attachment:    Type:   Image     Comment:   External Document

## 2010-10-02 NOTE — Assessment & Plan Note (Signed)
Summary: to discuss low platelet/njr   Vital Signs:  Patient profile:   60 year old male Weight:      193 pounds Pulse rate:   60 / minute BP sitting:   100 / 70  (left arm) Cuff size:   regular  Vitals Entered By: Romualdo Bolk, CMA Duncan Dull) (April 24, 2010 1:38 PM) CC: Follow-up visit on labs   History of Present Illness: Jon Carter comes in today   walked in  emergently   for panic over  blood count readings and  recc about getting hematology consult .     Anxiety has been better  but  having a hard time with this news.   exercising no fever sweats unintended weight loss.  no unusual bruising .  stil on effient    and tends to bruise easier on extremities with sig contacts but non on trunk  etc.   May be going off   the effieint  as its close to a  year of rx .   Had stress test recently and apparently ok . to see Cards soon.  Does have some fatigue and ? if any  change with the androgel.    Preventive Screening-Counseling & Management  Alcohol-Tobacco     Alcohol drinks/day: <1     Alcohol type: beer, wine     Smoking Status: never  Caffeine-Diet-Exercise     Caffeine use/day: 2     Does Patient Exercise: yes     Type of exercise: wts, jogging, treadmill     Times/week: 7  Current Medications (verified): 1)  Clonazepam 1 Mg Tabs (Clonazepam) .... 1/2  By Mouth Two Times A Day As Needed 2)  Famotidine 20 Mg Tabs (Famotidine) .Marland Kitchen.. 1 By Mouth Qam 3)  Effient 10 Mg Tabs (Prasugrel Hcl) .Marland Kitchen.. 1 By Mouth Qam 4)  Doxycycline Hyclate 100 Mg Tabs (Doxycycline Hyclate) .Marland Kitchen.. 1 By Mouth Once Daily- Every Other Day 5)  Metrogel 1 % Gel (Metronidazole) 6)  Aspirin 81 Mg Tbec (Aspirin) 7)  Niaspan 1000 Mg Cr-Tabs (Niacin (Antihyperlipidemic)) .Marland Kitchen.. 1 By Mouth At Bedtime 8)  Crestor 10 Mg Tabs (Rosuvastatin Calcium) .Marland Kitchen.. 1 By Mouth Once Daily 9)  Nitrostat 0.4 Mg Subl (Nitroglycerin) 10)  Boost  Liqd (Nutritional Supplements) 11)  Vitamin D3 1000 Unit Caps  (Cholecalciferol) 12)  Co Q-10 150 Mg Caps (Coenzyme Q10) 13)  Androgel Pump 1 % Gel (Testosterone) .... 2 Pumps Per Day  Allergies (verified): 1)  Lexapro (Escitalopram Oxalate)  Past History:  Past medical, surgical, family and social histories (including risk factors) reviewed, and no changes noted (except as noted below).  Past Medical History: Reviewed history from 12/13/2009 and no changes required. Hyperlipidemia hx of low HDL  zero [facility] in 2000 at [facility] see paper chart STEMI MI left circumflex DE stent   August 2010 nl stress test  Anxiety Rosacea        LAST Td: 10/09 Colonscopy: 5 years ago- due in April 2010 EKG: 06/08/06  CONSULTANTS  Elmon Else derm [facility]  Past Surgical History: Reviewed history from 06/29/2008 and no changes required. cataract left eye  [place]   2009  Past History:  Care Management: Dermatology: Dr. Emily Filbert Gastroenterology: Kinnie Scales Cardiology: Tresa Endo Psychology  Dellia Cloud  Family History: Reviewed history from 12/13/2009 and no changes required. see paper chart Family History of Alcoholism/Addiction Mom 70.s sudden death Brother   overweight    HBP   alcohol Father tongue cancer   tobacco  Social History: Reviewed history from 12/13/2009 and no changes required. 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    Physical Exam  General:  Well-developed,well-nourished,in no acute distress; alert,appropriate and cooperative throughout examination  midly anxious   looks well  Head:  normocephalic and atraumatic.    Eyes:  vision grossly intact.   Ears:  no external deformities.   Mouth:  pharynx pink and moist.   Neck:  No deformities, masses, or tenderness noted. Lungs:  Normal respiratory effort, chest expands symmetrically. Lungs are clear to auscultation, no crackles or wheezes. Heart:  Normal rate and regular rhythm. S1 and S2 normal without gallop, murmur, click, rub or other extra sounds. Abdomen:  Bowel sounds positive,abdomen soft and non-tender without masses, organomegaly or   noted. Extremities:  no clubbing cyanosis or edema  Neurologic:  non focal  Skin:  turgor normal, color normal, no petechiae, and no purpura.  few faded supserficial bruise on lower extremity ( green color)  no hematomas and no petechia  Cervical Nodes:  No lymphadenopathy noted Axillary Nodes:  No palpable lymphadenopathy Inguinal Nodes:  No significant adenopathy Psych:  Oriented X3, good eye contact, not depressed appearing, and moderately anxious.   nnl cognition and speech   Impression & Recommendations:  Problem # 1:  LEUKOPENIA, MILD WITH BORDERLINE PLATELETS (ICD-288.50) Assessment Deteriorated lower count now no symptoms spenomegaly or adenopathy ...    reviewed old readings he had and had lower wbc pre effient .      hemconsult already pending.    Problem # 2:  ADJUSTMENT DISORDER WITH ANXIETY (ICD-309.24) ongoing   was improving but this urgent appt shows his angst is still at work .    counseled  and continue  . is functiong well however and continuing counseling   Problem # 3:  HYPOGONADISM (ICD-257.2) increase adrogel to 3 pumps    level low nl  362   Problem # 4:  CAD (ICD-414.00) Assessment: Unchanged  stable . His updated medication list for this problem includes:    Effient 10 Mg Tabs (Prasugrel hcl) .Marland Kitchen... 1 by mouth qam    Aspirin 81 Mg Tbec (Aspirin)    Nitrostat 0.4 Mg Subl (Nitroglycerin)  Labs Reviewed: Chol: 110 (11/24/2009)   HDL: 47.80 (11/24/2009)   LDL: 51 (11/24/2009)   TG:  57.0 (11/24/2009)  Complete Medication List: 1)  Clonazepam 1 Mg Tabs (Clonazepam) .... 1/2  by mouth two times a day as needed 2)  Famotidine 20 Mg Tabs (Famotidine) .Marland Kitchen.. 1 by mouth qam 3)  Effient 10 Mg Tabs (Prasugrel hcl) .Marland Kitchen.. 1 by mouth qam 4)  Doxycycline Hyclate 100 Mg Tabs (Doxycycline hyclate) .Marland Kitchen.. 1 by mouth once daily- every other day 5)  Metrogel 1 % Gel (Metronidazole) 6)  Aspirin 81 Mg Tbec (Aspirin) 7)  Niaspan 1000 Mg Cr-tabs (Niacin (antihyperlipidemic)) .Marland Kitchen.. 1 by mouth at bedtime 8)  Crestor 10 Mg Tabs (Rosuvastatin calcium) .Marland Kitchen.. 1 by mouth once daily 9)  Nitrostat 0.4 Mg Subl (Nitroglycerin) 10)  Boost Liqd (Nutritional supplements) 11)  Vitamin D3 1000 Unit Caps (Cholecalciferol) 12)  Co Q-10 150  Mg Caps (Coenzyme q10) 13)  Androgel Pump 1 % Gel (Testosterone) .... 2 pumps per day  Patient Instructions: 1)  increase androgel   to 3 pumps per day.  2)  Keep appt in  September   3)  Waiting on hematology appt.

## 2010-10-03 ENCOUNTER — Ambulatory Visit (HOSPITAL_COMMUNITY)
Admission: RE | Admit: 2010-10-03 | Discharge: 2010-10-03 | Payer: BC Managed Care – PPO | Source: Ambulatory Visit | Attending: Cardiovascular Disease | Admitting: Cardiovascular Disease

## 2010-10-04 NOTE — Medication Information (Signed)
Summary: Care Consideration for Beta Blocker/  [employer] [facility] [employer]  Care Consideration for Beta Blocker/ [facility] [employer]   Imported By: Lennie Odor 08/22/2010 12:17:31  _____________________________________________________________________  External Attachment:    Type:   Image     Comment:   External Document

## 2010-10-05 ENCOUNTER — Other Ambulatory Visit: Payer: Self-pay | Admitting: Internal Medicine

## 2010-10-05 ENCOUNTER — Encounter (HOSPITAL_COMMUNITY)
Admission: RE | Admit: 2010-10-05 | Discharge: 2010-10-05 | Disposition: A | Discharge status: Home / Self Care | Payer: Self-pay | Source: Ambulatory Visit | Attending: Cardiovascular Disease | Admitting: Cardiovascular Disease

## 2010-10-05 ENCOUNTER — Other Ambulatory Visit: Payer: Self-pay

## 2010-10-05 ENCOUNTER — Encounter (INDEPENDENT_AMBULATORY_CARE_PROVIDER_SITE_OTHER): Payer: Self-pay | Admitting: *Deleted

## 2010-10-05 ENCOUNTER — Ambulatory Visit: Admit: 2010-10-05 | Payer: Self-pay | Admitting: Internal Medicine

## 2010-10-05 DIAGNOSIS — I4589 Other specified conduction disorders: Secondary | ICD-10-CM | POA: Insufficient documentation

## 2010-10-05 DIAGNOSIS — Z5189 Encounter for other specified aftercare: Secondary | ICD-10-CM | POA: Insufficient documentation

## 2010-10-05 DIAGNOSIS — I252 Old myocardial infarction: Secondary | ICD-10-CM | POA: Insufficient documentation

## 2010-10-05 DIAGNOSIS — I2510 Atherosclerotic heart disease of native coronary artery without angina pectoris: Secondary | ICD-10-CM | POA: Insufficient documentation

## 2010-10-05 DIAGNOSIS — E785 Hyperlipidemia, unspecified: Secondary | ICD-10-CM | POA: Insufficient documentation

## 2010-10-05 DIAGNOSIS — Z7982 Long term (current) use of aspirin: Secondary | ICD-10-CM | POA: Insufficient documentation

## 2010-10-05 DIAGNOSIS — I200 Unstable angina: Secondary | ICD-10-CM | POA: Insufficient documentation

## 2010-10-05 DIAGNOSIS — Z9861 Coronary angioplasty status: Secondary | ICD-10-CM | POA: Insufficient documentation

## 2010-10-05 DIAGNOSIS — F528 Other sexual dysfunction not due to a substance or known physiological condition: Secondary | ICD-10-CM

## 2010-10-05 DIAGNOSIS — Z79899 Other long term (current) drug therapy: Secondary | ICD-10-CM | POA: Insufficient documentation

## 2010-10-05 DIAGNOSIS — Z8249 Family history of ischemic heart disease and other diseases of the circulatory system: Secondary | ICD-10-CM | POA: Insufficient documentation

## 2010-10-08 ENCOUNTER — Encounter (HOSPITAL_COMMUNITY): Payer: Self-pay

## 2010-10-10 ENCOUNTER — Encounter (HOSPITAL_COMMUNITY): Payer: Self-pay

## 2010-10-10 NOTE — Letter (Signed)
Summary: [facility]  [facility]   Imported By: Maryln Gottron 10/04/2010 14:08:23  _____________________________________________________________________  External Attachment:    Type:   Image     Comment:   External Document

## 2010-10-12 ENCOUNTER — Encounter: Payer: Self-pay | Admitting: Internal Medicine

## 2010-10-12 ENCOUNTER — Ambulatory Visit (INDEPENDENT_AMBULATORY_CARE_PROVIDER_SITE_OTHER): Payer: BC Managed Care – PPO | Admitting: Internal Medicine

## 2010-10-12 ENCOUNTER — Encounter (HOSPITAL_COMMUNITY): Payer: Self-pay

## 2010-10-12 DIAGNOSIS — E781 Pure hyperglyceridemia: Secondary | ICD-10-CM

## 2010-10-12 DIAGNOSIS — I2510 Atherosclerotic heart disease of native coronary artery without angina pectoris: Secondary | ICD-10-CM

## 2010-10-12 DIAGNOSIS — E291 Testicular hypofunction: Secondary | ICD-10-CM

## 2010-10-12 DIAGNOSIS — F528 Other sexual dysfunction not due to a substance or known physiological condition: Secondary | ICD-10-CM

## 2010-10-12 DIAGNOSIS — F4322 Adjustment disorder with anxiety: Secondary | ICD-10-CM

## 2010-10-12 DIAGNOSIS — D696 Thrombocytopenia, unspecified: Secondary | ICD-10-CM | POA: Insufficient documentation

## 2010-10-12 DIAGNOSIS — R5381 Other malaise: Secondary | ICD-10-CM

## 2010-10-12 DIAGNOSIS — E785 Hyperlipidemia, unspecified: Secondary | ICD-10-CM

## 2010-10-12 DIAGNOSIS — Z0000 Encounter for general adult medical examination without abnormal findings: Secondary | ICD-10-CM

## 2010-10-12 MED ORDER — TESTOSTERONE 20.25 MG/ACT (1.62%) TD GEL: 2.0000 | Freq: Once | TRANSDERMAL | Status: DC

## 2010-10-12 NOTE — Assessment & Plan Note (Signed)
Currently adjusting testosterone treatment.

## 2010-10-12 NOTE — Assessment & Plan Note (Signed)
Much improved trying to wean himself from the Klonopin. No other intervention at this time.

## 2010-10-12 NOTE — Assessment & Plan Note (Signed)
On 3 pumps the day of the 1% AndroGel levels are still low we'll change the 1.63 and give 2-3 pumps a day discussed use consider other preparations if not improving. Plan rechecking level in 3 months.

## 2010-10-12 NOTE — Assessment & Plan Note (Signed)
Stable no symptoms in cardiac rehabilitation

## 2010-10-12 NOTE — Assessment & Plan Note (Signed)
Was supposed to have an order sent to [name] and it gone but it wasn't apologized for  the medical system.   He will be due for a full set of labs within the next 30 days so we'll order this CBC and differential and platelets for next week and get the results to Dr. Glynis Smiles had

## 2010-10-12 NOTE — Assessment & Plan Note (Signed)
On 3 pumps the day of the 1% AndroGel levels are still low we'll change the 1.63 and give 2-3 pumps a day discussed use consider other preparations if not improving. Plan rechecking level in 3 months.  

## 2010-10-12 NOTE — Assessment & Plan Note (Signed)
Everything else seems stable but his testosterone level is still low we'll work on elevating this before looking for other causes at this point would avoid over-the-counter supplements that prominence energy to avoid potential medication side effects with his current regimen.

## 2010-10-12 NOTE — Patient Instructions (Signed)
Send results to his hematologist and to him personally then plan followup.

## 2010-10-12 NOTE — Progress Notes (Signed)
  Subjective:    Patient ID: Jon Carter, male    DOB: 10-01-1950, 60 y.o.   MRN: [id number]  HPI  patient comes in today for follow up of her multiple medical problems particularly his low testosterone level.   Been on  AndroGel 1%   3 pumps recently per day. He still feels quite fatigued.   No side effects of the medication.   He also thought he was supposed to get a platelet count within order faxed to the room lab by his hematologist who evaluated him for a low platelet count. However this lab test was not done.  He is in cardiac rehabilitation has no recurring chest pain and has been seen by cardiology recently he is now off the at the Effient  And just on the asa . No bruising bleeding weight loss fevers swollen glands.   Review of Systems  negative fever chest pain shortness of breath anxiety is improved taking about 1/2  Clonopin  As needed and trying to wean   No longer in counseling.    Objective:   Physical Exam  well-developed well-nourished healthy appearing in no acute distress less anxious than usual. Neck supple without masses thyromegaly or bruits.  Chest CTA bs = cardiac S1-S2 no gallops or murmurs abdomen soft without organomegaly.  testosterone level reviewed with patient. It is still low       Assessment & Plan:   hypogonadism  Fatigue  Coronary artery disease  Anxiety

## 2010-10-15 ENCOUNTER — Other Ambulatory Visit: Payer: BC Managed Care – PPO

## 2010-10-15 ENCOUNTER — Encounter (HOSPITAL_COMMUNITY): Payer: Self-pay

## 2010-10-17 ENCOUNTER — Encounter (HOSPITAL_COMMUNITY): Payer: Self-pay

## 2010-10-19 ENCOUNTER — Other Ambulatory Visit (INDEPENDENT_AMBULATORY_CARE_PROVIDER_SITE_OTHER): Payer: BC Managed Care – PPO | Admitting: Internal Medicine

## 2010-10-19 ENCOUNTER — Encounter (HOSPITAL_COMMUNITY): Payer: Self-pay

## 2010-10-19 DIAGNOSIS — I10 Essential (primary) hypertension: Secondary | ICD-10-CM

## 2010-10-19 DIAGNOSIS — E039 Hypothyroidism, unspecified: Secondary | ICD-10-CM

## 2010-10-19 DIAGNOSIS — I2510 Atherosclerotic heart disease of native coronary artery without angina pectoris: Secondary | ICD-10-CM

## 2010-10-19 DIAGNOSIS — R5381 Other malaise: Secondary | ICD-10-CM

## 2010-10-19 DIAGNOSIS — D649 Anemia, unspecified: Secondary | ICD-10-CM

## 2010-10-19 DIAGNOSIS — E785 Hyperlipidemia, unspecified: Secondary | ICD-10-CM

## 2010-10-19 DIAGNOSIS — F528 Other sexual dysfunction not due to a substance or known physiological condition: Secondary | ICD-10-CM

## 2010-10-19 DIAGNOSIS — E781 Pure hyperglyceridemia: Secondary | ICD-10-CM

## 2010-10-19 DIAGNOSIS — Z0000 Encounter for general adult medical examination without abnormal findings: Secondary | ICD-10-CM

## 2010-10-19 DIAGNOSIS — T887XXA Unspecified adverse effect of drug or medicament, initial encounter: Secondary | ICD-10-CM

## 2010-10-19 DIAGNOSIS — G839 Paralytic syndrome, unspecified: Secondary | ICD-10-CM

## 2010-10-19 DIAGNOSIS — R5383 Other fatigue: Secondary | ICD-10-CM

## 2010-10-19 LAB — BASIC METABOLIC PANEL
BUN: 14 mg/dL (ref 6–23)
Calcium: 9.2 mg/dL (ref 8.4–10.5)
Creatinine, Ser: 0.8 mg/dL (ref 0.4–1.5)
GFR: 100.46 mL/min (ref >60.00)
Glucose, Bld: 92 mg/dL (ref 70–99)
Sodium: 142 mEq/L (ref 135–145)

## 2010-10-19 LAB — CBC WITH DIFFERENTIAL/PLATELET
HCT: 44.6 % (ref 39.0–52.0)
Hemoglobin: 14.9 g/dL (ref 13.0–17.0)
RBC: 4.76 Mil/uL (ref 4.22–5.81)
WBC: 3.8 10*3/uL — ABNORMAL LOW (ref 4.5–10.5)

## 2010-10-19 LAB — LIPID PANEL
Cholesterol: 78 mg/dL (ref 0–200)
HDL: 30.8 mg/dL — ABNORMAL LOW (ref >39.00)
LDL Cholesterol: 40 mg/dL (ref 0–99)
Triglycerides: 37 mg/dL (ref 0.0–149.0)
VLDL: 7.4 mg/dL (ref 0.0–40.0)

## 2010-10-19 LAB — HEPATIC FUNCTION PANEL
Albumin: 3.9 g/dL (ref 3.5–5.2)
Alkaline Phosphatase: 40 U/L (ref 39–117)

## 2010-10-19 NOTE — Progress Notes (Signed)
Addended by: Rossie Muskrat on: 10/19/2010 01:10 PM   Modules accepted: Orders

## 2010-10-22 ENCOUNTER — Encounter (HOSPITAL_COMMUNITY): Payer: Self-pay

## 2010-10-24 ENCOUNTER — Telehealth: Payer: Self-pay | Admitting: *Deleted

## 2010-10-24 ENCOUNTER — Encounter (HOSPITAL_COMMUNITY): Payer: Self-pay

## 2010-10-24 ENCOUNTER — Other Ambulatory Visit: Payer: BC Managed Care – PPO

## 2010-10-24 NOTE — Telephone Encounter (Signed)
Message copied by Tor Netters on Wed Oct 24, 2010  9:29 AM ------      Message from: [facility], [place] [facility]      Created: Tue Oct 23, 2010  5:16 PM       Call  patient  results  Lipids good   Platelets in the 80K range    Liver still with a minimal prob insignificant  Abnormality.      Please make sure to  send labs to his hematologist and his cardiologist .      Make sure he has appt with me in 6 months but  May need repeat labs from his hematologist in the meantime if they send [name] orders .

## 2010-10-24 NOTE — Telephone Encounter (Signed)
Left message on machine about results. Results faxed to [facility].

## 2010-10-26 ENCOUNTER — Encounter (HOSPITAL_COMMUNITY): Payer: Self-pay

## 2010-10-29 ENCOUNTER — Encounter (HOSPITAL_COMMUNITY): Payer: Self-pay

## 2010-10-31 ENCOUNTER — Encounter (HOSPITAL_COMMUNITY): Payer: Self-pay

## 2010-11-02 ENCOUNTER — Encounter (HOSPITAL_COMMUNITY): Payer: Self-pay | Attending: Cardiovascular Disease

## 2010-11-02 DIAGNOSIS — I200 Unstable angina: Secondary | ICD-10-CM | POA: Insufficient documentation

## 2010-11-02 DIAGNOSIS — Z9861 Coronary angioplasty status: Secondary | ICD-10-CM | POA: Insufficient documentation

## 2010-11-02 DIAGNOSIS — Z7982 Long term (current) use of aspirin: Secondary | ICD-10-CM | POA: Insufficient documentation

## 2010-11-02 DIAGNOSIS — I2510 Atherosclerotic heart disease of native coronary artery without angina pectoris: Secondary | ICD-10-CM | POA: Insufficient documentation

## 2010-11-02 DIAGNOSIS — Z5189 Encounter for other specified aftercare: Secondary | ICD-10-CM | POA: Insufficient documentation

## 2010-11-02 DIAGNOSIS — Z79899 Other long term (current) drug therapy: Secondary | ICD-10-CM | POA: Insufficient documentation

## 2010-11-02 DIAGNOSIS — I252 Old myocardial infarction: Secondary | ICD-10-CM | POA: Insufficient documentation

## 2010-11-02 DIAGNOSIS — Z8249 Family history of ischemic heart disease and other diseases of the circulatory system: Secondary | ICD-10-CM | POA: Insufficient documentation

## 2010-11-02 DIAGNOSIS — E785 Hyperlipidemia, unspecified: Secondary | ICD-10-CM | POA: Insufficient documentation

## 2010-11-02 DIAGNOSIS — I4589 Other specified conduction disorders: Secondary | ICD-10-CM | POA: Insufficient documentation

## 2010-11-05 ENCOUNTER — Encounter (HOSPITAL_COMMUNITY): Payer: Self-pay

## 2010-11-07 ENCOUNTER — Encounter (HOSPITAL_COMMUNITY): Payer: Self-pay

## 2010-11-09 ENCOUNTER — Encounter (HOSPITAL_COMMUNITY): Payer: Self-pay

## 2010-11-12 ENCOUNTER — Encounter (HOSPITAL_COMMUNITY): Payer: Self-pay

## 2010-11-14 ENCOUNTER — Encounter (HOSPITAL_COMMUNITY): Payer: Self-pay

## 2010-11-16 ENCOUNTER — Encounter (HOSPITAL_COMMUNITY): Payer: Self-pay

## 2010-11-19 ENCOUNTER — Encounter (HOSPITAL_COMMUNITY): Payer: Self-pay

## 2010-11-21 ENCOUNTER — Encounter (HOSPITAL_COMMUNITY): Payer: Self-pay

## 2010-11-23 ENCOUNTER — Encounter (HOSPITAL_COMMUNITY): Payer: Self-pay

## 2010-11-26 ENCOUNTER — Encounter (HOSPITAL_COMMUNITY): Payer: Self-pay

## 2010-11-28 ENCOUNTER — Encounter (HOSPITAL_COMMUNITY): Payer: Self-pay

## 2010-11-30 ENCOUNTER — Encounter (HOSPITAL_COMMUNITY): Payer: Self-pay

## 2010-12-03 ENCOUNTER — Encounter (HOSPITAL_COMMUNITY): Payer: Self-pay | Attending: Cardiovascular Disease

## 2010-12-03 DIAGNOSIS — I4589 Other specified conduction disorders: Secondary | ICD-10-CM | POA: Insufficient documentation

## 2010-12-03 DIAGNOSIS — I200 Unstable angina: Secondary | ICD-10-CM | POA: Insufficient documentation

## 2010-12-03 DIAGNOSIS — Z5189 Encounter for other specified aftercare: Secondary | ICD-10-CM | POA: Insufficient documentation

## 2010-12-03 DIAGNOSIS — I252 Old myocardial infarction: Secondary | ICD-10-CM | POA: Insufficient documentation

## 2010-12-03 DIAGNOSIS — Z8249 Family history of ischemic heart disease and other diseases of the circulatory system: Secondary | ICD-10-CM | POA: Insufficient documentation

## 2010-12-03 DIAGNOSIS — Z7982 Long term (current) use of aspirin: Secondary | ICD-10-CM | POA: Insufficient documentation

## 2010-12-03 DIAGNOSIS — Z9861 Coronary angioplasty status: Secondary | ICD-10-CM | POA: Insufficient documentation

## 2010-12-03 DIAGNOSIS — Z79899 Other long term (current) drug therapy: Secondary | ICD-10-CM | POA: Insufficient documentation

## 2010-12-03 DIAGNOSIS — E785 Hyperlipidemia, unspecified: Secondary | ICD-10-CM | POA: Insufficient documentation

## 2010-12-03 DIAGNOSIS — I2510 Atherosclerotic heart disease of native coronary artery without angina pectoris: Secondary | ICD-10-CM | POA: Insufficient documentation

## 2010-12-05 ENCOUNTER — Encounter (HOSPITAL_COMMUNITY): Payer: Self-pay

## 2010-12-07 ENCOUNTER — Encounter (HOSPITAL_COMMUNITY): Payer: Self-pay

## 2010-12-07 LAB — CBC
MCV: 92.5 fL (ref 78.0–100.0)
Platelets: 121 10*3/uL — ABNORMAL LOW (ref 150–400)
RDW: 13.4 % (ref 11.5–15.5)
WBC: 7.9 10*3/uL (ref 4.0–10.5)

## 2010-12-07 LAB — BASIC METABOLIC PANEL
BUN: 12 mg/dL (ref 6–23)
Chloride: 107 mEq/L (ref 96–112)
Creatinine, Ser: 1.05 mg/dL (ref 0.4–1.5)
Glucose, Bld: 83 mg/dL (ref 70–99)

## 2010-12-08 LAB — BRAIN NATRIURETIC PEPTIDE
Pro B Natriuretic peptide (BNP): 112 pg/mL — ABNORMAL HIGH (ref 0.0–100.0)
Pro B Natriuretic peptide (BNP): 99 pg/mL (ref 0.0–100.0)

## 2010-12-08 LAB — CBC
HCT: 40.6 % (ref 39.0–52.0)
HCT: 41.8 % (ref 39.0–52.0)
HCT: 42.5 % (ref 39.0–52.0)
HCT: 42.9 % (ref 39.0–52.0)
Hemoglobin: 13.9 g/dL (ref 13.0–17.0)
Hemoglobin: 14.4 g/dL (ref 13.0–17.0)
Hemoglobin: 14.6 g/dL (ref 13.0–17.0)
Hemoglobin: 15.2 g/dL (ref 13.0–17.0)
MCHC: 34.1 g/dL (ref 30.0–36.0)
MCHC: 34.2 g/dL (ref 30.0–36.0)
MCHC: 34.3 g/dL (ref 30.0–36.0)
MCHC: 34.3 g/dL (ref 30.0–36.0)
MCHC: 34.3 g/dL (ref 30.0–36.0)
MCV: 91.8 fL (ref 78.0–100.0)
MCV: 92 fL (ref 78.0–100.0)
MCV: 92.2 fL (ref 78.0–100.0)
Platelets: 111 10*3/uL — ABNORMAL LOW (ref 150–400)
Platelets: 120 10*3/uL — ABNORMAL LOW (ref 150–400)
Platelets: 138 10*3/uL — ABNORMAL LOW (ref 150–400)
RBC: 4.44 MIL/uL (ref 4.22–5.81)
RBC: 4.55 MIL/uL (ref 4.22–5.81)
RBC: 4.61 MIL/uL (ref 4.22–5.81)
RDW: 13.2 % (ref 11.5–15.5)
RDW: 13.4 % (ref 11.5–15.5)
WBC: 4 10*3/uL (ref 4.0–10.5)
WBC: 6.4 10*3/uL (ref 4.0–10.5)
WBC: 7.2 10*3/uL (ref 4.0–10.5)

## 2010-12-08 LAB — BASIC METABOLIC PANEL
BUN: 12 mg/dL (ref 6–23)
BUN: 13 mg/dL (ref 6–23)
BUN: 13 mg/dL (ref 6–23)
CO2: 19 mEq/L (ref 19–32)
CO2: 25 mEq/L (ref 19–32)
CO2: 25 mEq/L (ref 19–32)
CO2: 26 mEq/L (ref 19–32)
CO2: 27 mEq/L (ref 19–32)
CO2: 29 mEq/L (ref 19–32)
Calcium: 8.6 mg/dL (ref 8.4–10.5)
Calcium: 8.7 mg/dL (ref 8.4–10.5)
Calcium: 8.7 mg/dL (ref 8.4–10.5)
Calcium: 8.8 mg/dL (ref 8.4–10.5)
Calcium: 9.4 mg/dL (ref 8.4–10.5)
Calcium: 9.7 mg/dL (ref 8.4–10.5)
Chloride: 106 mEq/L (ref 96–112)
Chloride: 107 mEq/L (ref 96–112)
Creatinine, Ser: 0.84 mg/dL (ref 0.4–1.5)
Creatinine, Ser: 0.96 mg/dL (ref 0.4–1.5)
Creatinine, Ser: 1.04 mg/dL (ref 0.4–1.5)
Creatinine, Ser: 1.06 mg/dL (ref 0.4–1.5)
GFR calc Af Amer: >60 mL/min (ref >60)
GFR calc Af Amer: >60 mL/min (ref >60)
GFR calc Af Amer: >60 mL/min (ref >60)
GFR calc Af Amer: >60 mL/min (ref >60)
GFR calc non Af Amer: >60 mL/min (ref >60)
GFR calc non Af Amer: >60 mL/min (ref >60)
GFR calc non Af Amer: >60 mL/min (ref >60)
Glucose, Bld: 127 mg/dL — ABNORMAL HIGH (ref 70–99)
Glucose, Bld: 89 mg/dL (ref 70–99)
Glucose, Bld: 94 mg/dL (ref 70–99)
Potassium: 3.6 mEq/L (ref 3.5–5.1)
Sodium: 138 mEq/L (ref 135–145)
Sodium: 138 mEq/L (ref 135–145)
Sodium: 139 mEq/L (ref 135–145)
Sodium: 139 mEq/L (ref 135–145)

## 2010-12-08 LAB — DIFFERENTIAL
Basophils Absolute: 0 10*3/uL (ref 0.0–0.1)
Basophils Absolute: 0 10*3/uL (ref 0.0–0.1)
Basophils Relative: 1 % (ref 0–1)
Eosinophils Relative: 0 % (ref 0–5)
Eosinophils Relative: 1 % (ref 0–5)
Lymphocytes Relative: 16 % (ref 12–46)
Lymphocytes Relative: 47 % — ABNORMAL HIGH (ref 12–46)
Lymphs Abs: 1.9 10*3/uL (ref 0.7–4.0)
Monocytes Absolute: 1.2 10*3/uL — ABNORMAL HIGH (ref 0.1–1.0)
Monocytes Relative: 22 % — ABNORMAL HIGH (ref 3–12)
Neutro Abs: 5.1 10*3/uL (ref 1.7–7.7)
Neutrophils Relative %: 30 % — ABNORMAL LOW (ref 43–77)

## 2010-12-08 LAB — LIPID PANEL
Cholesterol: 164 mg/dL (ref 0–200)
HDL: 28 mg/dL — ABNORMAL LOW (ref >39)
HDL: 29 mg/dL — ABNORMAL LOW (ref >39)
LDL Cholesterol: 114 mg/dL — ABNORMAL HIGH (ref 0–99)
LDL Cholesterol: 80 mg/dL (ref 0–99)
Total CHOL/HDL Ratio: 4.7 RATIO
Triglycerides: 111 mg/dL (ref <150)
VLDL: 12 mg/dL (ref 0–40)
VLDL: 19 mg/dL (ref 0–40)

## 2010-12-08 LAB — POCT CARDIAC MARKERS
CKMB, poc: <1 ng/mL — ABNORMAL LOW (ref 1.0–8.0)
Myoglobin, poc: 57.2 ng/mL (ref 12–200)
Troponin i, poc: 0.08 ng/mL (ref 0.00–0.09)

## 2010-12-08 LAB — CK TOTAL AND CKMB (NOT AT ARMC)
CK, MB: 1.1 ng/mL (ref 0.3–4.0)
CK, MB: 2 ng/mL (ref 0.3–4.0)
Relative Index: 1.3 (ref 0.0–2.5)
Relative Index: INVALID (ref 0.0–2.5)
Total CK: 157 U/L (ref 7–232)
Total CK: 81 U/L (ref 7–232)

## 2010-12-08 LAB — CARDIAC PANEL(CRET KIN+CKTOT+MB+TROPI)
CK, MB: 102.7 ng/mL — ABNORMAL HIGH (ref 0.3–4.0)
Relative Index: 12.3 — ABNORMAL HIGH (ref 0.0–2.5)
Total CK: 546 U/L — ABNORMAL HIGH (ref 7–232)
Total CK: 69 U/L (ref 7–232)
Total CK: 74 U/L (ref 7–232)
Total CK: 835 U/L — ABNORMAL HIGH (ref 7–232)
Troponin I: 0.29 ng/mL — ABNORMAL HIGH (ref 0.00–0.06)
Troponin I: 11.91 ng/mL (ref 0.00–0.06)
Troponin I: 18.36 ng/mL (ref 0.00–0.06)

## 2010-12-08 LAB — HEPATIC FUNCTION PANEL
ALT: 37 U/L (ref 0–53)
AST: 30 U/L (ref 0–37)
Bilirubin, Direct: 0.2 mg/dL (ref 0.0–0.3)
Indirect Bilirubin: 0.8 mg/dL (ref 0.3–0.9)
Total Protein: 7.6 g/dL (ref 6.0–8.3)

## 2010-12-08 LAB — APTT
aPTT: 25 seconds (ref 24–37)
aPTT: 26 seconds (ref 24–37)

## 2010-12-08 LAB — PROTIME-INR
INR: 1 (ref 0.00–1.49)
INR: 1.1 (ref 0.00–1.49)
Prothrombin Time: 13.9 seconds (ref 11.6–15.2)

## 2010-12-10 ENCOUNTER — Encounter (HOSPITAL_COMMUNITY): Payer: Self-pay

## 2010-12-12 ENCOUNTER — Encounter (HOSPITAL_COMMUNITY): Payer: Self-pay

## 2010-12-14 ENCOUNTER — Encounter (HOSPITAL_COMMUNITY): Payer: Self-pay

## 2010-12-17 ENCOUNTER — Encounter (HOSPITAL_COMMUNITY): Payer: Self-pay

## 2010-12-18 ENCOUNTER — Other Ambulatory Visit: Payer: Self-pay | Admitting: Internal Medicine

## 2010-12-19 ENCOUNTER — Encounter (HOSPITAL_COMMUNITY): Payer: Self-pay

## 2010-12-19 NOTE — Telephone Encounter (Signed)
Per Dr. Fabian Sharp- Ok x 1. Rx called into pharmacy

## 2010-12-21 ENCOUNTER — Encounter (HOSPITAL_COMMUNITY): Payer: Self-pay

## 2010-12-24 ENCOUNTER — Encounter (HOSPITAL_COMMUNITY): Payer: Self-pay

## 2010-12-26 ENCOUNTER — Encounter (HOSPITAL_COMMUNITY): Payer: Self-pay

## 2010-12-28 ENCOUNTER — Encounter (HOSPITAL_COMMUNITY): Payer: Self-pay

## 2010-12-31 ENCOUNTER — Encounter (HOSPITAL_COMMUNITY): Payer: Self-pay

## 2011-01-02 ENCOUNTER — Encounter (HOSPITAL_COMMUNITY): Payer: Self-pay | Attending: Cardiovascular Disease

## 2011-01-02 DIAGNOSIS — Z8249 Family history of ischemic heart disease and other diseases of the circulatory system: Secondary | ICD-10-CM | POA: Insufficient documentation

## 2011-01-02 DIAGNOSIS — Z9861 Coronary angioplasty status: Secondary | ICD-10-CM | POA: Insufficient documentation

## 2011-01-02 DIAGNOSIS — I252 Old myocardial infarction: Secondary | ICD-10-CM | POA: Insufficient documentation

## 2011-01-02 DIAGNOSIS — E785 Hyperlipidemia, unspecified: Secondary | ICD-10-CM | POA: Insufficient documentation

## 2011-01-02 DIAGNOSIS — Z5189 Encounter for other specified aftercare: Secondary | ICD-10-CM | POA: Insufficient documentation

## 2011-01-02 DIAGNOSIS — I4589 Other specified conduction disorders: Secondary | ICD-10-CM | POA: Insufficient documentation

## 2011-01-02 DIAGNOSIS — I200 Unstable angina: Secondary | ICD-10-CM | POA: Insufficient documentation

## 2011-01-02 DIAGNOSIS — I2510 Atherosclerotic heart disease of native coronary artery without angina pectoris: Secondary | ICD-10-CM | POA: Insufficient documentation

## 2011-01-02 DIAGNOSIS — Z79899 Other long term (current) drug therapy: Secondary | ICD-10-CM | POA: Insufficient documentation

## 2011-01-02 DIAGNOSIS — Z7982 Long term (current) use of aspirin: Secondary | ICD-10-CM | POA: Insufficient documentation

## 2011-01-04 ENCOUNTER — Encounter (HOSPITAL_COMMUNITY): Payer: Self-pay

## 2011-01-07 ENCOUNTER — Encounter (HOSPITAL_COMMUNITY): Payer: Self-pay

## 2011-01-09 ENCOUNTER — Encounter (HOSPITAL_COMMUNITY): Payer: Self-pay

## 2011-01-11 ENCOUNTER — Encounter (HOSPITAL_COMMUNITY): Payer: Self-pay

## 2011-01-14 ENCOUNTER — Encounter (HOSPITAL_COMMUNITY): Payer: Self-pay

## 2011-01-15 NOTE — Discharge Summary (Signed)
NAMELEONCE, BALE NO.:  [id number]   MEDICAL RECORD NO.:  [id number]          PATIENT TYPE:  INP   LOCATION:  3710                         FACILITY:  [facility]   PHYSICIAN:  Nicki Guadalajara, M.D.     DATE OF BIRTH:  [birth year]   DATE OF ADMISSION:  05/02/2009  DATE OF DISCHARGE:  05/03/2009                               DISCHARGE SUMMARY   DISCHARGE DIAGNOSES:  1. Unstable angina, catheterization this admission revealing a patent      circumflex stent with 50-60% right coronary artery and 50-60% left      anterior descending.  2. Preserved left ventricular function.  3. History of coronary disease with previous total circumflex ST      elevation myocardial infarction on April 26, 2009, treated with      urgent intervention and stenting.  4. Dyslipidemia.   HOSPITAL COURSE:  The patient is a 60 year old male who was recently  admitted with an ST elevation MI on April 26, 2009, and underwent  circumflex stenting.  He presented to [facility] ER on May 01, 2009, with  recurrent chest pain worrisome for unstable angina.  His troponins were  still elevated at 0.17.  He was taken to the cath lab on the May 02, 2009, Dr. Allyson Sabal.  Catheterization revealed a patent circumflex stent, 50-  60% LAD, and 50-60% RCA with normal LV function.  Dr. Tresa Endo feels the  patient can be discharged on May 03, 2009.  He will be set up for  an outpatient Myoview.   LABORATORY DATA:  White count 7.9, hemoglobin 13.7, hematocrit 40.4,  platelets 121.  Sodium 140, potassium 4.0, BUN 12, creatinine 1.05.  Troponins peaked at 0.42.  Cholesterol is 117, HDL 25, LDL 80.  EKG  shows sinus rhythm with inferior T-wave inversion.   DISPOSITION:  The patient is discharged in stable condition and will  follow up with Dr. Tresa Endo as an outpatient after [facility].  Dr. Tresa Endo  indicated he will adjust his Niaspan as an outpatient.  Please see the  medicine reconciliation form for discharge  medications.      Abelino Derrick, P.A.    ______________________________  Nicki Guadalajara, M.D.    Lenard Lance  D:  05/03/2009  T:  05/04/2009  Job:  [id number]

## 2011-01-15 NOTE — H&P (Signed)
NAME:  Jon Carter, Jon Carter NO.:  [id number]   MEDICAL RECORD NO.:  [id number]          PATIENT TYPE:  EMS   LOCATION:  MAJO                         FACILITY:  [facility]   PHYSICIAN:  Nicki Guadalajara, M.D.     DATE OF BIRTH:  1951/03/22   DATE OF ADMISSION:  04/26/2009  DATE OF DISCHARGE:                              HISTORY & PHYSICAL   CHIEF COMPLAINT:  Chest pain.   HPI:  Jon Carter is a 60 year old male with no prior history of coronary  disease who presents to the emergency room this morning with substernal  chest pain.  He said he had fairly severe pain that started about 7:15  a.m.  He describes a midsternal chest pressure and gives a positive  Levine's sign.  He had some mild diaphoresis with this, but no radiation  to his jaw or arms.  He came to the emergency room where his EKG showed  ST elevation in inferolateral leads.  He is now being taken to the cath  lab as an acute.   PAST MEDICAL HISTORY:  Remarkable for rosacea.  He has no history of  hypertension or diabetes, and his lipids are unknown.  He has had no  major surgeries.   CURRENT MEDICATIONS:  Anti-inflammatories on a p.r.n. basis.   He has no known drug allergies.   SOCIAL HISTORY:  He is married.  He is a nonsmoker.  He exercises 5 days  a week.  He alternates weightlifting and treadmill.  He has not recently  had any problems at the gym.   FAMILY HISTORY:  Remarkable for coronary disease.  He has had 1 sibling  have an MI in his 31s.   REVIEW OF SYSTEMS:  Essentially unremarkable except for noted above.  He  denies any GI bleeding, melena, fever or chills.   PHYSICAL EXAMINATION:  Blood pressure 121/70, pulse 61, temp 97.4,  respirations 12.  GENERAL:  He is a well-developed, well-nourished, slightly pale male in  the emergency room in no acute distress.  HEENT:  Normocephalic, extraocular movements are intact. Sclerae are  nonicteric.  Lids and conjunctivae are within normal limits.  NECK:   Without bruit or JVD.  CHEST:  Clear to auscultation and percussion.  CARDIAC:  Reveals regular rate and rhythm without murmur. rub or gallop.  Normal S1, S2.  ABDOMEN:  Nontender.  No hepatosplenomegaly.  Bowel sounds are present.  EXTREMITIES:  Without edema.  Distal pulses are 3+/4 bilaterally.  NEUROLOGICAL:  Grossly intact.  He is awake, alert, oriented, and  cooperative.  Moves all extremities without obvious deficit.  SKIN:  Cool and dry.   LABORATORIES:  Labs are pending.   IMPRESSION:  1. Acute myocardial infarction.  2. Family history of coronary artery disease.   PLAN:  The patient is taken urgently to the cath lab by Dr. Tresa Endo.      Jon Carter, P.A.    ______________________________  Nicki Guadalajara, M.D.    Jon Carter  D:  04/26/2009  T:  04/26/2009  Job:  [id number]   cc:   [facility]

## 2011-01-15 NOTE — Cardiovascular Report (Signed)
NAME:  Jon Carter, Jon Carter NO.:  [id number]   MEDICAL RECORD NO.:  [id number]          PATIENT TYPE:  INP   LOCATION:  3710                         FACILITY:  [facility]   PHYSICIAN:  Nanetta Batty, M.D.   DATE OF BIRTH:  1950-12-12   DATE OF PROCEDURE:  DATE OF DISCHARGE:                            CARDIAC CATHETERIZATION   HISTORY OF PRESENT ILLNESS:  [name] is a 60 year old thin-appearing  white male who had a ST-segment elevation myocardial infarction  involving his circumflex coronary artery week ago.  This was stented by  Dr. Nicki Guadalajara with excellent results.  He did have moderate LAD and  RCA disease.  He was sent home this past Sunday and was admitted today  with some nausea and atypical right-sided chest pain.  His troponins  were mildly elevated and he had nonspecific T-wave changes.  He presents  now for diagnostic coronary arteriography to define his anatomy and rule  out ischemic etiology.   DESCRIPTION OF PROCEDURE:  The patient was brought to the Second Floor  [facility] Cardiac Cath Lab in a postabsorptive state.  He was  premedicated with p.o. Valium.  His right groin was prepped and shaved  in the usual sterile fashion.  Xylocaine 1% was used for local  anesthesia.  A 5-French sheath was inserted into the right femoral  artery using standard Seldinger technique.  A 5-French right and left  Judkins diagnostic catheters were used for selective coronary  angiography respectively.  Visipaque dye was used for the entirety of  the case.  Retrograde aortic pressures were monitored during the case.  An LV gram was not performed since it was just performed within the last  week and was roughly normal.   HEMODYNAMICS:  The aortic systolic pressure 120, diastolic pressure was  70.   SELECTIVE CORONARY ANGIOGRAPHY:  1. Left main normal.  2. LAD; LAD had a 50-60% smooth proximal stenosis at the first septal      perforator.  3. Ramus branch.  This was a  large vessel that bifurcated into the      midportion and had a 25-30% segmental proximal stenosis.  4. Circumflex; widely patent stent.  5. Right coronary artery; dominant with a 50-60% segmental stenosis in      the midportion unchanged from the prior angiogram.   IMPRESSION:  [name] has a widely patent circumflex stent with  otherwise unchanged anatomy.  He does have moderate residual disease in  his LAD and RCA.  He will need aggressive medical therapy, cardiac risk  factor modification and an outpatient Myoview to determine physiologic  significance of his residual CAD.   The sheath was removed and pressure was held in the groin to achieve  hemostasis.  The patient left the lab in stable condition.      Nanetta Batty, M.D.  Electronically Signed     JB/MEDQ  D:  05/02/2009  T:  05/03/2009  Job:  [id number]   cc:   [facility]  [facility]  Nicki Guadalajara, M.D.  Neta Mends. Panosh,  MD

## 2011-01-15 NOTE — Discharge Summary (Signed)
NAMEJAMALL, STROHMEIER NO.:  [id number]   MEDICAL RECORD NO.:  [id number]          PATIENT TYPE:  INP   LOCATION:  2040                         FACILITY:  [facility]   PHYSICIAN:  Nicki Guadalajara, M.D.     DATE OF BIRTH:  11-05-1950   DATE OF ADMISSION:  04/26/2009  DATE OF DISCHARGE:  04/30/2009                               DISCHARGE SUMMARY   DISCHARGE DIAGNOSES:  1. Acute ST-elevation myocardial infarction, inferolateral.  2. Coronary artery disease with total mid circumflex occlusion,      undergoing percutaneous transluminal coronary angioplasty and stent      deployment with a drug-eluting Xience stent.      a.     Residual coronary disease, nonobstructive, 40% mid left       anterior descending, 50% diagonal, 30-40% proximal ramus, and       proximal to mid 50% right coronary artery stenosis.      b.     Ejection fraction 55%.  3. Transient idioventricular reperfusion arrhythmia, resolved after IV      beta-blocker.  4. Dyslipidemia.  5. Rosacea, treated with doxycycline.  6. Family history of coronary artery disease.  7. Thrombocytopenia.   DISCHARGE CONDITION:  Improved.   PROCEDURES:  1. Emergent cardiac catheterization, April 26, 2009 by Dr. Tresa Endo.  2. April 26, 2009, rescue percutaneous transluminal coronary      angioplasty stent deployment to the circumflex vessel that was 100%      occluded.   DISCHARGE MEDICATIONS:  See med rec sheet.   DISCHARGE INSTRUCTIONS:  1. Increase activity slowly.  May shower.  No lifting for 1-2 weeks.      No driving for 1 week.  No sexual activity for 1 week.  Return to      work, May 15, 2009.  2. Wash cath site with soap and water.  Call if any bleeding,      swelling, or drainage.  3. Follow with Dr. Tresa Endo in the week of May 09, 2009.  His office      will call and give me the date and time.  4. Take your Niaspan at night.   HOSPITAL COURSE:  The patient was admitted emergently to [facility] on April 26, 2009 presenting to the emergency room with  substernal chest pain and he had had fairly severe pain that started  7:15 on the morning of April 26, 2009.  He described as midsternal  chest pressure, and gave a positive for Levine signs.  He had mild  diaphoresis with his pain.  No radiation to his jaw.  EKG in the ER  showed ST elevation in the inferior and lateral leads and he was taken  emergently to the cath lab for cardiac catheterization.   PAST MEDICAL HISTORY:  Remarkable for rosacea and dyslipidemia as he was  on fish oil as an outpatient, localized as specifically.   ALLERGIES:  No known drug allergies.   FAMILY HISTORY/SOCIAL HISTORY/REVIEW OF SYSTEMS:  See H and P.   PHYSICAL EXAMINATION ON DISCHARGE:  VITAL SIGNS:  Blood pressure 109/65,  pulse somewhat bradycardic 46-56 up to 70 at times with beta-blocker,  respirations 18, temperature 97.2, and oxygen saturation on room air  99%.  HEART:  Regular rate and rhythm.  LUNGS:  Clear.  ABDOMEN:  Positive bowel sounds.  EXTREMITIES:  Without edema.  The patient felt great at time of  discharge.   IMAGING:  EKG at discharge:  Sinus bradycardia, rate 50, otherwise  normal EKG.  His EKG changes that were present on admission have  resolved.   LABORATORY VALUES:  Hemoglobin remained normal during hospitalization,  at discharge 14.4, hematocrit 41.8, WBC 7.2, and platelets 99,000.  Please note on admission, platelets were 120,000.  Also on admission,  neutrophils 30, lymphocytes 47, and monocytes 22.  Chemistry at  discharge:  Sodium 140, potassium 4.0, chloride 107, CO2 26, glucose 89,  BUN 12, creatinine 1.06, and calcium 8.8.  Please note, magnesium on  admission was 2.2.   Protime 13.2, INR of 1, and PTT 25 on admission.   Cardiac enzymes:  Initial CK was 1072 with MB of 129 and troponin of 20.  Second CK 835 with an MB of 102 and troponin 18.36 and third CK 546 with  an MB of 48 and the troponin  of 11.91.   Cholesterol on admission:  Total cholesterol 164, triglycerides 111, HDL  28, and LDL 114.  TSH was 1.971.   RADIOLOGY:  Chest x-ray with no active disease.  No significant changes.   The patient did well during hospitalization.  No complications after the  initial cardiac cath with stent placement.  We will monitor his  platelets as an outpatient on the Effient.      Darcella Gasman. Annie Paras, N.P.    ______________________________  Nicki Guadalajara, M.D.    LRI/MEDQ  D:  04/30/2009  T:  04/30/2009  Job:  [id number]   cc:   Neta Mends. Fabian Sharp, MD

## 2011-01-15 NOTE — Cardiovascular Report (Signed)
NAME:  Jon Carter NO.:  [id number]   MEDICAL RECORD NO.:  [id number]          PATIENT TYPE:  INP   LOCATION:  2903                         FACILITY:  [facility]   PHYSICIAN:  Nicki Guadalajara, M.D.     DATE OF BIRTH:  1950-10-12   DATE OF PROCEDURE:  DATE OF DISCHARGE:                            CARDIAC CATHETERIZATION   PROCEDURES:  Emergent cardiac catheterization:  Cine coronary  angiography; biplane selective left ventriculography; distal  aortography; percutaneous coronary intervention with percutaneous  transluminal coronary angioplasty/stenting of the left circumflex  coronary artery.   PATIENT PROFILE:  [name] is a 60 year old gentleman without  prior cardiac history.  Over the past several weeks, he has noticed a  sensation of perhaps being slightly more sluggish.  This morning at  approximately 7:15, he developed a chest pressure.  He ultimately  presented to [facility] Emergency Room where ECG showed the beginnings  of an early ST-segment elevation, inferior wall myocardial infarction.  Code STEMI was called.  The patient was taken emergently to the Cardiac  Catheterization Laboratory.  In the emergency room, he received heparin  4000 units, 4 baby aspirin, started on low-dose IV nitroglycerin, and  received 30 mg of morphine.   PROCEDURE:  Upon arrival to the Catheterization Laboratory, the patient  still had mild residual chest pain.  Right femoral artery was punctured  anteriorly and a 6-French sheath was inserted.  Diagnostic  catheterization was done utilizing 6-French Judkins for left and right  coronary catheters.  With the demonstration of total mid circumflex  occlusion, percutaneous coronary intervention was performed.  Since the  patient had received heparin in the emergency room, he was now given a  double bolus Integrilin and ACT was obtained.  He also received 60 mg of  oral prasugrel, antiplatelet therapy.  IC nitroglycerin was  administered  down the circumflex vessel, and he received several doses of  intracoronary nitroglycerin during the procedure.  The interventional  procedure was done utilizing a XP4 guide.  An Asahi medium wire was  advanced down the circumflex and was able to open the total occlusion.  Blood flow was established in less than 30 minutes from when the patient  was seen by me in the emergency room.  A 2.5 x 15 mm Apex balloon was  inserted over the Asahi medium wire and several inflations were made.  Once the vessel was opened, the circumflex was a moderate-sized vessel.  There was an area of ectasia proximal to the total occlusion followed by  50% narrowing prior to the total occlusion, which occurred after a  marginal branch.  There was also narrowing beyond this segment.  Consequently, a 2.75 x 18 mm Xience V DES stent was then inserted and  successfully deployed x2 at 11 and 13 atmospheres.  Post-stent  dilatation was done utilizing a 3.0 x 15 mm Noncompliant Voyager balloon  with stent taper from a 3.0 mm proximally to 2.92 mm distally.  Total  balloon time was excellent and was less than 40 minutes from ER  presentation.  Scout angiography confirmed an excellent  angiographic  result.  The catheter and wires were then removed and a 6-French pigtail  catheter was used and advanced and inserted into the left ventricle.  Biplane cine selective left ventriculography was performed.  Catheter  was pulled back into the AO.  Distal aortography was also performed to  make certain there was no significant aortoiliac disease.  At the  completion of the procedure, the patient was pain free and his ECG had  significantly improved.  Of note, once the vessel was initially opened,  the patient did develop an idioventricular rhythm as a mark of his  reperfusion and he was treated with 2.5 mg IV Lopressor with  stabilization.   HEMODYNAMIC DATA:  1. Central aortic pressure was 110/70.  2. Left  ventricular pressure 110/14.   ANGIOGRAPHIC DATA:  1. Left main coronary artery was a long vessel, which trifurcated into      an LAD, moderate-size ramus intermediate vessel, and left      circumflex system.  2. The LAD had segmental 40% stenosis proximal to and beyond the first      septal perforating artery and there was 50% narrowing in the first      diagonal vessel.  The LAD extended to the apex.  3. The ramus intermediate vessel was a moderate-sized vessel that had      diffuse 30-40% proximal narrowing.  4. The circumflex vessel after giving rise to a left atrial circumflex      branch had an area of ectasia followed by 50% narrowing and then      the circumflex was totally occluded with TIMI 0 flow just beyond      the marginal vessel.  5. The right coronary artery was a large-caliber dominant vessel that      had diffuse 50% proximal to mid stenosis.   Following percutaneous coronary intervention to the totally occluded  circumflex once the vessel was opened, there was another area of 70%  narrowing in the AV groove circumflex.  Following stenting with a 2.75 x  18 mm Xience V DES stent with post-stent dilatation up to 3.0 proximally  to 2.92 distally, the entire region was reduced to 0%.  The vessel gave  rise to 3 additional branches distally and had brisk TIMI 3 flow.   Biplane selective cine left ventriculography revealed preserved global  contractility with an EF of 55%.  On the RAO projection, there was very  subtle mild mid inferior hypocontractility in the mid to basal segment.  On the LAO projection, there was a very small focal area of mid  posterolateral hypocontractility.   Distal aortography revealed widely patent renal arteries without  significant stenosis.   IMPRESSION:  1. Acute ST-segment elevation myocardial infarction secondary to total      mid circumflex occlusion.  2. Multivessel coronary obstructive disease with a mild 40% proximal      left  anterior descending segmental stenosis was 50% diagonal      stenosis; 30-40% proximal ramus intermediate stenosis, and diffuse      proximal to mid 50% right coronary artery stenosis.  3. Successful percutaneous coronary intervention of the 100% occluded      circumflex vessel with percutaneous transluminal coronary      angioplasty/stenting with a 2.75 x 18 mm Xience V drug-eluting      stent post-dilated to a 3.0 proximally 2.92 distally with a 100%      occlusion being reduced to 0% and with TIMI 0 flow being  improved      to TIMI 3 flow.  4. Transient idioventricular reperfusion arrhythmia treated with IV      beta-blocker therapy.  5. Preserved acute global contractility with only very mild mid      inferior and mid posterolateral hypocontractility.  6. Double bolus Integrilin/heparin/prasugrel, aspirin intracoronary      and intravenous nitroglycerin therapy.  7. Excellent total balloon time of less than 40 minutes.           ______________________________  Nicki Guadalajara, M.D.     TK/MEDQ  D:  04/26/2009  T:  04/27/2009  Job:  [id number]   cc:   Neta Mends. Fabian Sharp, MD

## 2011-01-16 ENCOUNTER — Encounter (HOSPITAL_COMMUNITY): Payer: Self-pay

## 2011-01-18 ENCOUNTER — Encounter (HOSPITAL_COMMUNITY): Payer: Self-pay

## 2011-01-21 ENCOUNTER — Encounter (HOSPITAL_COMMUNITY): Payer: Self-pay

## 2011-01-23 ENCOUNTER — Encounter (HOSPITAL_COMMUNITY): Payer: Self-pay

## 2011-01-25 ENCOUNTER — Encounter (HOSPITAL_COMMUNITY): Payer: Self-pay

## 2011-01-28 ENCOUNTER — Encounter (HOSPITAL_COMMUNITY): Payer: Self-pay

## 2011-01-30 ENCOUNTER — Encounter (HOSPITAL_COMMUNITY): Payer: Self-pay

## 2011-02-01 ENCOUNTER — Encounter (HOSPITAL_COMMUNITY): Payer: Self-pay | Attending: Cardiovascular Disease

## 2011-02-01 DIAGNOSIS — I4589 Other specified conduction disorders: Secondary | ICD-10-CM | POA: Insufficient documentation

## 2011-02-01 DIAGNOSIS — I252 Old myocardial infarction: Secondary | ICD-10-CM | POA: Insufficient documentation

## 2011-02-01 DIAGNOSIS — Z5189 Encounter for other specified aftercare: Secondary | ICD-10-CM | POA: Insufficient documentation

## 2011-02-01 DIAGNOSIS — I200 Unstable angina: Secondary | ICD-10-CM | POA: Insufficient documentation

## 2011-02-01 DIAGNOSIS — Z8249 Family history of ischemic heart disease and other diseases of the circulatory system: Secondary | ICD-10-CM | POA: Insufficient documentation

## 2011-02-01 DIAGNOSIS — Z79899 Other long term (current) drug therapy: Secondary | ICD-10-CM | POA: Insufficient documentation

## 2011-02-01 DIAGNOSIS — I2510 Atherosclerotic heart disease of native coronary artery without angina pectoris: Secondary | ICD-10-CM | POA: Insufficient documentation

## 2011-02-01 DIAGNOSIS — E785 Hyperlipidemia, unspecified: Secondary | ICD-10-CM | POA: Insufficient documentation

## 2011-02-01 DIAGNOSIS — Z7982 Long term (current) use of aspirin: Secondary | ICD-10-CM | POA: Insufficient documentation

## 2011-02-01 DIAGNOSIS — Z9861 Coronary angioplasty status: Secondary | ICD-10-CM | POA: Insufficient documentation

## 2011-02-04 ENCOUNTER — Encounter (HOSPITAL_COMMUNITY): Payer: Self-pay

## 2011-02-06 ENCOUNTER — Encounter (HOSPITAL_COMMUNITY): Payer: Self-pay

## 2011-02-08 ENCOUNTER — Encounter (HOSPITAL_COMMUNITY): Payer: Self-pay

## 2011-02-11 ENCOUNTER — Encounter (HOSPITAL_COMMUNITY): Payer: Self-pay

## 2011-02-13 ENCOUNTER — Encounter (HOSPITAL_COMMUNITY): Payer: Self-pay

## 2011-02-15 ENCOUNTER — Encounter (HOSPITAL_COMMUNITY): Payer: Self-pay

## 2011-02-18 ENCOUNTER — Encounter (HOSPITAL_COMMUNITY): Payer: Self-pay

## 2011-02-20 ENCOUNTER — Encounter (HOSPITAL_COMMUNITY): Payer: Self-pay

## 2011-02-22 ENCOUNTER — Encounter (HOSPITAL_COMMUNITY): Payer: Self-pay

## 2011-02-25 ENCOUNTER — Encounter (HOSPITAL_COMMUNITY): Payer: Self-pay

## 2011-02-27 ENCOUNTER — Encounter (HOSPITAL_COMMUNITY): Payer: Self-pay

## 2011-03-01 ENCOUNTER — Encounter (HOSPITAL_COMMUNITY): Payer: Self-pay

## 2011-03-04 ENCOUNTER — Encounter (HOSPITAL_COMMUNITY): Payer: Self-pay | Attending: Cardiovascular Disease

## 2011-03-04 DIAGNOSIS — Z8249 Family history of ischemic heart disease and other diseases of the circulatory system: Secondary | ICD-10-CM | POA: Insufficient documentation

## 2011-03-04 DIAGNOSIS — I252 Old myocardial infarction: Secondary | ICD-10-CM | POA: Insufficient documentation

## 2011-03-04 DIAGNOSIS — Z79899 Other long term (current) drug therapy: Secondary | ICD-10-CM | POA: Insufficient documentation

## 2011-03-04 DIAGNOSIS — Z7982 Long term (current) use of aspirin: Secondary | ICD-10-CM | POA: Insufficient documentation

## 2011-03-04 DIAGNOSIS — I200 Unstable angina: Secondary | ICD-10-CM | POA: Insufficient documentation

## 2011-03-04 DIAGNOSIS — Z5189 Encounter for other specified aftercare: Secondary | ICD-10-CM | POA: Insufficient documentation

## 2011-03-04 DIAGNOSIS — E785 Hyperlipidemia, unspecified: Secondary | ICD-10-CM | POA: Insufficient documentation

## 2011-03-04 DIAGNOSIS — Z9861 Coronary angioplasty status: Secondary | ICD-10-CM | POA: Insufficient documentation

## 2011-03-04 DIAGNOSIS — I2510 Atherosclerotic heart disease of native coronary artery without angina pectoris: Secondary | ICD-10-CM | POA: Insufficient documentation

## 2011-03-04 DIAGNOSIS — I4589 Other specified conduction disorders: Secondary | ICD-10-CM | POA: Insufficient documentation

## 2011-03-06 ENCOUNTER — Encounter (HOSPITAL_COMMUNITY): Payer: Self-pay

## 2011-03-08 ENCOUNTER — Encounter (HOSPITAL_COMMUNITY): Payer: Self-pay

## 2011-03-11 ENCOUNTER — Encounter (HOSPITAL_COMMUNITY): Payer: Self-pay

## 2011-03-13 ENCOUNTER — Encounter (HOSPITAL_COMMUNITY): Payer: Self-pay

## 2011-03-15 ENCOUNTER — Encounter (HOSPITAL_COMMUNITY): Payer: Self-pay

## 2011-03-18 ENCOUNTER — Encounter (HOSPITAL_COMMUNITY): Payer: Self-pay

## 2011-03-20 ENCOUNTER — Encounter (HOSPITAL_COMMUNITY): Payer: Self-pay

## 2011-03-22 ENCOUNTER — Encounter (HOSPITAL_COMMUNITY): Payer: Self-pay

## 2011-03-25 ENCOUNTER — Encounter (HOSPITAL_COMMUNITY): Payer: Self-pay

## 2011-03-27 ENCOUNTER — Encounter (HOSPITAL_COMMUNITY): Payer: Self-pay

## 2011-03-29 ENCOUNTER — Encounter (HOSPITAL_COMMUNITY): Payer: Self-pay

## 2011-04-01 ENCOUNTER — Encounter (HOSPITAL_COMMUNITY): Payer: Self-pay

## 2011-04-03 ENCOUNTER — Encounter (HOSPITAL_COMMUNITY): Payer: Self-pay | Attending: Cardiovascular Disease

## 2011-04-03 DIAGNOSIS — I2510 Atherosclerotic heart disease of native coronary artery without angina pectoris: Secondary | ICD-10-CM | POA: Insufficient documentation

## 2011-04-03 DIAGNOSIS — I252 Old myocardial infarction: Secondary | ICD-10-CM | POA: Insufficient documentation

## 2011-04-03 DIAGNOSIS — E785 Hyperlipidemia, unspecified: Secondary | ICD-10-CM | POA: Insufficient documentation

## 2011-04-03 DIAGNOSIS — Z79899 Other long term (current) drug therapy: Secondary | ICD-10-CM | POA: Insufficient documentation

## 2011-04-03 DIAGNOSIS — I4589 Other specified conduction disorders: Secondary | ICD-10-CM | POA: Insufficient documentation

## 2011-04-03 DIAGNOSIS — I200 Unstable angina: Secondary | ICD-10-CM | POA: Insufficient documentation

## 2011-04-03 DIAGNOSIS — Z8249 Family history of ischemic heart disease and other diseases of the circulatory system: Secondary | ICD-10-CM | POA: Insufficient documentation

## 2011-04-03 DIAGNOSIS — Z7982 Long term (current) use of aspirin: Secondary | ICD-10-CM | POA: Insufficient documentation

## 2011-04-03 DIAGNOSIS — Z9861 Coronary angioplasty status: Secondary | ICD-10-CM | POA: Insufficient documentation

## 2011-04-03 DIAGNOSIS — Z5189 Encounter for other specified aftercare: Secondary | ICD-10-CM | POA: Insufficient documentation

## 2011-04-05 ENCOUNTER — Encounter (HOSPITAL_COMMUNITY): Payer: Self-pay

## 2011-04-08 ENCOUNTER — Encounter (HOSPITAL_COMMUNITY): Payer: Self-pay

## 2011-04-10 ENCOUNTER — Encounter (HOSPITAL_COMMUNITY): Payer: Self-pay

## 2011-04-12 ENCOUNTER — Encounter (HOSPITAL_COMMUNITY): Payer: Self-pay

## 2011-04-15 ENCOUNTER — Encounter (HOSPITAL_COMMUNITY): Payer: Self-pay

## 2011-04-17 ENCOUNTER — Encounter (HOSPITAL_COMMUNITY): Payer: Self-pay

## 2011-04-19 ENCOUNTER — Encounter (HOSPITAL_COMMUNITY): Payer: Self-pay

## 2011-04-22 ENCOUNTER — Encounter (HOSPITAL_COMMUNITY): Payer: Self-pay

## 2011-04-24 ENCOUNTER — Encounter (HOSPITAL_COMMUNITY): Payer: Self-pay

## 2011-04-26 ENCOUNTER — Encounter (HOSPITAL_COMMUNITY): Payer: Self-pay

## 2011-04-29 ENCOUNTER — Encounter (HOSPITAL_COMMUNITY): Payer: Self-pay

## 2011-05-01 ENCOUNTER — Encounter (HOSPITAL_COMMUNITY): Payer: Self-pay

## 2011-05-02 ENCOUNTER — Other Ambulatory Visit: Payer: Self-pay | Admitting: Internal Medicine

## 2011-05-02 DIAGNOSIS — E785 Hyperlipidemia, unspecified: Secondary | ICD-10-CM

## 2011-05-02 DIAGNOSIS — E786 Lipoprotein deficiency: Secondary | ICD-10-CM

## 2011-05-02 DIAGNOSIS — Z0000 Encounter for general adult medical examination without abnormal findings: Secondary | ICD-10-CM

## 2011-05-02 DIAGNOSIS — E291 Testicular hypofunction: Secondary | ICD-10-CM

## 2011-05-02 DIAGNOSIS — E781 Pure hyperglyceridemia: Secondary | ICD-10-CM

## 2011-05-02 DIAGNOSIS — I10 Essential (primary) hypertension: Secondary | ICD-10-CM

## 2011-05-02 DIAGNOSIS — F528 Other sexual dysfunction not due to a substance or known physiological condition: Secondary | ICD-10-CM

## 2011-05-03 ENCOUNTER — Encounter (HOSPITAL_COMMUNITY): Payer: Self-pay

## 2011-05-03 NOTE — Telephone Encounter (Signed)
Per Dr. Fabian Sharp- ok x 1. Rx called in. Pt needs to have a psa and testosterone level done before his appt.

## 2011-05-06 ENCOUNTER — Encounter (HOSPITAL_COMMUNITY): Payer: Self-pay | Attending: Cardiovascular Disease

## 2011-05-06 DIAGNOSIS — I2510 Atherosclerotic heart disease of native coronary artery without angina pectoris: Secondary | ICD-10-CM | POA: Insufficient documentation

## 2011-05-06 DIAGNOSIS — Z5189 Encounter for other specified aftercare: Secondary | ICD-10-CM | POA: Insufficient documentation

## 2011-05-06 DIAGNOSIS — I200 Unstable angina: Secondary | ICD-10-CM | POA: Insufficient documentation

## 2011-05-06 DIAGNOSIS — I252 Old myocardial infarction: Secondary | ICD-10-CM | POA: Insufficient documentation

## 2011-05-06 DIAGNOSIS — I4589 Other specified conduction disorders: Secondary | ICD-10-CM | POA: Insufficient documentation

## 2011-05-06 DIAGNOSIS — Z9861 Coronary angioplasty status: Secondary | ICD-10-CM | POA: Insufficient documentation

## 2011-05-06 DIAGNOSIS — Z8249 Family history of ischemic heart disease and other diseases of the circulatory system: Secondary | ICD-10-CM | POA: Insufficient documentation

## 2011-05-06 DIAGNOSIS — Z7982 Long term (current) use of aspirin: Secondary | ICD-10-CM | POA: Insufficient documentation

## 2011-05-06 DIAGNOSIS — E785 Hyperlipidemia, unspecified: Secondary | ICD-10-CM | POA: Insufficient documentation

## 2011-05-06 DIAGNOSIS — Z79899 Other long term (current) drug therapy: Secondary | ICD-10-CM | POA: Insufficient documentation

## 2011-05-07 ENCOUNTER — Other Ambulatory Visit (INDEPENDENT_AMBULATORY_CARE_PROVIDER_SITE_OTHER): Payer: BC Managed Care – PPO

## 2011-05-07 DIAGNOSIS — F528 Other sexual dysfunction not due to a substance or known physiological condition: Secondary | ICD-10-CM

## 2011-05-07 DIAGNOSIS — E781 Pure hyperglyceridemia: Secondary | ICD-10-CM

## 2011-05-07 DIAGNOSIS — E785 Hyperlipidemia, unspecified: Secondary | ICD-10-CM

## 2011-05-07 DIAGNOSIS — Z0000 Encounter for general adult medical examination without abnormal findings: Secondary | ICD-10-CM

## 2011-05-07 DIAGNOSIS — E786 Lipoprotein deficiency: Secondary | ICD-10-CM

## 2011-05-07 DIAGNOSIS — I10 Essential (primary) hypertension: Secondary | ICD-10-CM

## 2011-05-07 DIAGNOSIS — E291 Testicular hypofunction: Secondary | ICD-10-CM

## 2011-05-07 LAB — TSH: TSH: 3.25 u[IU]/mL (ref 0.35–5.50)

## 2011-05-07 LAB — CBC WITH DIFFERENTIAL/PLATELET
Basophils Relative: 0 % (ref 0.0–3.0)
Eosinophils Absolute: 0 10*3/uL (ref 0.0–0.7)
Hemoglobin: 15.4 g/dL (ref 13.0–17.0)
MCHC: 33.5 g/dL (ref 30.0–36.0)
MCV: 91.8 fl (ref 78.0–100.0)
Monocytes Absolute: 1 10*3/uL (ref 0.1–1.0)
Neutro Abs: 1.3 10*3/uL — ABNORMAL LOW (ref 1.4–7.7)
RBC: 5.02 Mil/uL (ref 4.22–5.81)

## 2011-05-07 LAB — LIPID PANEL
Cholesterol: 115 mg/dL (ref 0–200)
VLDL: 11.4 mg/dL (ref 0.0–40.0)

## 2011-05-07 LAB — BASIC METABOLIC PANEL
BUN: 16 mg/dL (ref 6–23)
Calcium: 9 mg/dL (ref 8.4–10.5)
Chloride: 105 mEq/L (ref 96–112)
Creatinine, Ser: 1.1 mg/dL (ref 0.4–1.5)

## 2011-05-07 LAB — HEPATIC FUNCTION PANEL
ALT: 39 U/L (ref 0–53)
Total Bilirubin: 0.7 mg/dL (ref 0.3–1.2)
Total Protein: 7.1 g/dL (ref 6.0–8.3)

## 2011-05-08 ENCOUNTER — Encounter (HOSPITAL_COMMUNITY): Payer: Self-pay

## 2011-05-10 ENCOUNTER — Encounter (HOSPITAL_COMMUNITY): Payer: Self-pay

## 2011-05-12 ENCOUNTER — Other Ambulatory Visit: Payer: Self-pay | Admitting: Internal Medicine

## 2011-05-13 ENCOUNTER — Encounter (HOSPITAL_COMMUNITY): Payer: Self-pay

## 2011-05-13 NOTE — Telephone Encounter (Signed)
Ok x 1

## 2011-05-13 NOTE — Telephone Encounter (Signed)
LOV 10/12/10 NOV none Please advise

## 2011-05-14 ENCOUNTER — Encounter: Payer: Self-pay | Admitting: Internal Medicine

## 2011-05-14 ENCOUNTER — Ambulatory Visit (INDEPENDENT_AMBULATORY_CARE_PROVIDER_SITE_OTHER): Payer: BC Managed Care – PPO | Admitting: Internal Medicine

## 2011-05-14 VITALS — BP 140/80 | HR 66 | Ht 71.25 in | Wt 197.0 lb

## 2011-05-14 DIAGNOSIS — Z0000 Encounter for general adult medical examination without abnormal findings: Secondary | ICD-10-CM

## 2011-05-14 DIAGNOSIS — E291 Testicular hypofunction: Secondary | ICD-10-CM

## 2011-05-14 DIAGNOSIS — L719 Rosacea, unspecified: Secondary | ICD-10-CM | POA: Insufficient documentation

## 2011-05-14 DIAGNOSIS — Z23 Encounter for immunization: Secondary | ICD-10-CM

## 2011-05-14 DIAGNOSIS — F4322 Adjustment disorder with anxiety: Secondary | ICD-10-CM

## 2011-05-14 DIAGNOSIS — E781 Pure hyperglyceridemia: Secondary | ICD-10-CM

## 2011-05-14 DIAGNOSIS — D696 Thrombocytopenia, unspecified: Secondary | ICD-10-CM

## 2011-05-14 DIAGNOSIS — D72819 Decreased white blood cell count, unspecified: Secondary | ICD-10-CM | POA: Insufficient documentation

## 2011-05-14 DIAGNOSIS — I2510 Atherosclerotic heart disease of native coronary artery without angina pectoris: Secondary | ICD-10-CM

## 2011-05-14 DIAGNOSIS — E785 Hyperlipidemia, unspecified: Secondary | ICD-10-CM

## 2011-05-14 MED ORDER — TESTOSTERONE 20.25 MG/ACT (1.62%) TD GEL: 3.0000 | Freq: Every day | TRANSDERMAL | Status: DC

## 2011-05-14 NOTE — Assessment & Plan Note (Signed)
Weaning  clonipen 1/4 tab prn  Doing better 2 years out.

## 2011-05-14 NOTE — Patient Instructions (Addendum)
Increase androgel to 3 pumps per day  Check labs in about 4 months and follow up.  Continue lifestyle intervention healthy eating and exercise . Will send copy of labs to Dr Tresa Endo.

## 2011-05-14 NOTE — Telephone Encounter (Signed)
Rx called in 

## 2011-05-14 NOTE — Assessment & Plan Note (Signed)
No recurrence at present.

## 2011-05-14 NOTE — Progress Notes (Signed)
Subjective:    Patient ID: Jon Carter, male    DOB: 08-17-1951, 60 y.o.   MRN: [id number]  HPI Patient comes in today for preventive visit and follow-up of medical issues. Update of her history since her last visit. Since last visit he has done better  .still slightly anxious ut weaning the clonipen to 1/4 tab   and exercising very well.  Went on river cruise to [place] and felt great. Taking 2 pumps androgel   No se LIPIDS: no se of meds Low platelets  No bleeding or change in meds no unusual infections. BP has been ok per patient     Review of Systems ROS:  GEN/ HEENTNo fever, significant weight changes sweats headaches vision problems hearing changes, CV/ PULM; No chest pain shortness of breath cough, syncope,edema  change in exercise tolerance. GI /GU: No adominal pain, vomiting, change in bowel habits. No blood in the stool. No significant GU symptoms. SKIN/HEME: ,no acute skin rashes suspicious lesions or bleeding. No lymphadenopathy, nodules, masses.   Seen by Dr Hazle Quant for rosacea NEURO/ PSYCH:  No neurologic signs such as weakness numbness No depression anxiety. IMM/ Allergy: No unusual infections.  Allergy .   REST of 12 system review negative Past Medical History  Diagnosis Date  . Hyperlipidemia      Hx of low HDL zero CAC in 2000 at [facility]  . STEMI (ST elevation myocardial infarction)     left circumflex DE stent  August 2010 ni stress test  . Alcohol addiction    Past Surgical History  Procedure Date  . Cataract extraction 2009    left eye  [place]    reports that he has never smoked. He does not have any smokeless tobacco history on file. He reports that he drinks about one ounce of alcohol per week. He reports that he does not use illicit drugs. family history includes Cirrhosis in his father; Hypertension in his brother; Throat cancer in his father; and Tongue cancer in his father. Allergies  Allergen Reactions  . Escitalopram Oxalate     REACTION:  flushing of the skin and sick all over ?  only took 1 pill        Objective:   Physical Exam Physical Exam: Vital signs reviewed AOZ:HYQM is a well-developed well-nourished alert cooperative  White male  who appears   stated age in no acute distress.  HEENT: normocephalic  traumatic , Eyes: PERRL EOM's full, conjunctiva clear, Nares: patent no deformity discharge or tenderness., Ears: no deformity EAC's clear TMs with normal landmarks. Mouth: clear OP, no lesions, edema.  Moist mucous membranes. Dentition in adequate repair. NECK: supple without masses, thyromegaly or bruits. CHEST/PULM:  Clear to auscultation and percussion breath sounds equal no wheeze , rales or rhonchi. No chest wall deformities or tenderness. CV: PMI is nondisplaced, S1 S2 no gallops, murmurs, rubs. Peripheral pulses are full without delay.No JVD .  ABDOMEN: Bowel sounds normal nontender  No guard or rebound, no hepato splenomegal no CVA tenderness.  No hernia. Extremtities:  No clubbing cyanosis or edema, no acute joint swelling or redness no focal atrophy NEURO:  Oriented x3, cranial nerves 3-12 appear to be intact, no obvious focal weakness,gait within normal limits no abnormal reflexes or asymmetrical SKIN: No acute rashes normal turgor, color, no bruising or petechiae. PSYCH: Oriented, good eye contact, no obvious depression anxiety, cognition and judgment appear normal. LN:  No cervical axillary or inguinal adenopathy Prostate 1 + no nodules normal  Lab Results  Component Value Date   WBC 4.1* 05/07/2011   HGB 15.4 05/07/2011   HCT 46.0 05/07/2011   PLT 85.0 Repeated and verified X2.* 05/07/2011   CHOL 115 05/07/2011   TRIG 57.0 05/07/2011   HDL 39.90 05/07/2011   LDLDIRECT 128.7 01/04/2009   ALT 39 05/07/2011   AST 41* 05/07/2011   NA 138 05/07/2011   K 4.3 05/07/2011   CL 105 05/07/2011   CREATININE 1.1 05/07/2011   BUN 16 05/07/2011   CO2 28 05/07/2011   TSH 3.25 05/07/2011   PSA 0.47 05/07/2011   INR 1.1 05/01/2009            Assessment & Plan:  Preventive Health Care Counseled regarding healthy nutrition, exercise, sleep, injury prevention, calcium vit d and healthy weight . Flu shot today check into zostavax.  CAD stable Reactive anxiety  And doing well weaning clonipen Low platelets with slightly leukopenia   with hx of same and prev hem eval to follow ;normal exam and no bleeding. Hypogonadism  Increase androgel to 3 per day Minor liver abnormality LIPIDS   Ok  Rosacea   Per eye doc  rx   quiescent now

## 2011-05-14 NOTE — Assessment & Plan Note (Signed)
Increase to 3 pumps per day

## 2011-05-15 ENCOUNTER — Encounter (HOSPITAL_COMMUNITY): Payer: Self-pay

## 2011-05-17 ENCOUNTER — Encounter (HOSPITAL_COMMUNITY): Payer: Self-pay

## 2011-05-20 ENCOUNTER — Encounter (HOSPITAL_COMMUNITY): Payer: Self-pay

## 2011-05-21 ENCOUNTER — Telehealth: Payer: Self-pay | Admitting: *Deleted

## 2011-05-21 MED ORDER — ZOSTER VACCINE LIVE 19400 UNT/0.65ML ~~LOC~~ SOLR: 0.6500 mL | Freq: Once | SUBCUTANEOUS | Status: DC

## 2011-05-21 NOTE — Telephone Encounter (Signed)
Rx for zostavax. rx called in for pt.

## 2011-05-22 ENCOUNTER — Encounter (HOSPITAL_COMMUNITY): Payer: Self-pay

## 2011-05-24 ENCOUNTER — Encounter (HOSPITAL_COMMUNITY): Payer: Self-pay

## 2011-05-27 ENCOUNTER — Encounter (HOSPITAL_COMMUNITY): Payer: Self-pay

## 2011-05-29 ENCOUNTER — Encounter (HOSPITAL_COMMUNITY): Payer: Self-pay

## 2011-05-31 ENCOUNTER — Encounter (HOSPITAL_COMMUNITY): Payer: Self-pay

## 2011-06-03 ENCOUNTER — Encounter (HOSPITAL_COMMUNITY): Payer: Self-pay | Attending: Cardiovascular Disease

## 2011-06-03 DIAGNOSIS — I200 Unstable angina: Secondary | ICD-10-CM | POA: Insufficient documentation

## 2011-06-03 DIAGNOSIS — Z8249 Family history of ischemic heart disease and other diseases of the circulatory system: Secondary | ICD-10-CM | POA: Insufficient documentation

## 2011-06-03 DIAGNOSIS — I2510 Atherosclerotic heart disease of native coronary artery without angina pectoris: Secondary | ICD-10-CM | POA: Insufficient documentation

## 2011-06-03 DIAGNOSIS — E785 Hyperlipidemia, unspecified: Secondary | ICD-10-CM | POA: Insufficient documentation

## 2011-06-03 DIAGNOSIS — Z5189 Encounter for other specified aftercare: Secondary | ICD-10-CM | POA: Insufficient documentation

## 2011-06-03 DIAGNOSIS — Z9861 Coronary angioplasty status: Secondary | ICD-10-CM | POA: Insufficient documentation

## 2011-06-03 DIAGNOSIS — Z7982 Long term (current) use of aspirin: Secondary | ICD-10-CM | POA: Insufficient documentation

## 2011-06-03 DIAGNOSIS — Z79899 Other long term (current) drug therapy: Secondary | ICD-10-CM | POA: Insufficient documentation

## 2011-06-03 DIAGNOSIS — I252 Old myocardial infarction: Secondary | ICD-10-CM | POA: Insufficient documentation

## 2011-06-03 DIAGNOSIS — I4589 Other specified conduction disorders: Secondary | ICD-10-CM | POA: Insufficient documentation

## 2011-06-05 ENCOUNTER — Encounter (HOSPITAL_COMMUNITY): Payer: Self-pay

## 2011-06-07 ENCOUNTER — Encounter (HOSPITAL_COMMUNITY): Payer: Self-pay

## 2011-06-10 ENCOUNTER — Encounter (HOSPITAL_COMMUNITY): Payer: Self-pay

## 2011-06-12 ENCOUNTER — Encounter (HOSPITAL_COMMUNITY): Payer: Self-pay

## 2011-06-14 ENCOUNTER — Encounter (HOSPITAL_COMMUNITY): Payer: Self-pay

## 2011-06-17 ENCOUNTER — Encounter (HOSPITAL_COMMUNITY): Payer: Self-pay

## 2011-06-19 ENCOUNTER — Encounter (HOSPITAL_COMMUNITY): Payer: Self-pay

## 2011-06-21 ENCOUNTER — Encounter (HOSPITAL_COMMUNITY): Payer: Self-pay

## 2011-06-23 ENCOUNTER — Telehealth: Payer: Self-pay | Admitting: Internal Medicine

## 2011-06-24 ENCOUNTER — Encounter (HOSPITAL_COMMUNITY): Payer: Self-pay

## 2011-06-25 ENCOUNTER — Other Ambulatory Visit: Payer: Self-pay

## 2011-06-25 NOTE — Telephone Encounter (Signed)
Ok to refill x 4 

## 2011-06-25 NOTE — Telephone Encounter (Signed)
rx request for androgel 1.62%. Pls advise.

## 2011-06-25 NOTE — Telephone Encounter (Signed)
Pls advise.  

## 2011-06-26 ENCOUNTER — Encounter (HOSPITAL_COMMUNITY): Payer: Self-pay

## 2011-06-26 NOTE — Telephone Encounter (Signed)
rx called into pharmacy

## 2011-06-28 ENCOUNTER — Encounter (HOSPITAL_COMMUNITY): Payer: Self-pay

## 2011-07-01 ENCOUNTER — Encounter (HOSPITAL_COMMUNITY): Payer: Self-pay

## 2011-07-03 ENCOUNTER — Encounter (HOSPITAL_COMMUNITY): Payer: Self-pay

## 2011-07-05 ENCOUNTER — Encounter (HOSPITAL_COMMUNITY): Payer: Self-pay

## 2011-07-05 DIAGNOSIS — I252 Old myocardial infarction: Secondary | ICD-10-CM | POA: Insufficient documentation

## 2011-07-05 DIAGNOSIS — E785 Hyperlipidemia, unspecified: Secondary | ICD-10-CM | POA: Insufficient documentation

## 2011-07-05 DIAGNOSIS — Z9861 Coronary angioplasty status: Secondary | ICD-10-CM | POA: Insufficient documentation

## 2011-07-05 DIAGNOSIS — I2510 Atherosclerotic heart disease of native coronary artery without angina pectoris: Secondary | ICD-10-CM | POA: Insufficient documentation

## 2011-07-05 DIAGNOSIS — Z7982 Long term (current) use of aspirin: Secondary | ICD-10-CM | POA: Insufficient documentation

## 2011-07-05 DIAGNOSIS — I200 Unstable angina: Secondary | ICD-10-CM | POA: Insufficient documentation

## 2011-07-05 DIAGNOSIS — Z79899 Other long term (current) drug therapy: Secondary | ICD-10-CM | POA: Insufficient documentation

## 2011-07-05 DIAGNOSIS — I4589 Other specified conduction disorders: Secondary | ICD-10-CM | POA: Insufficient documentation

## 2011-07-05 DIAGNOSIS — Z5189 Encounter for other specified aftercare: Secondary | ICD-10-CM | POA: Insufficient documentation

## 2011-07-05 DIAGNOSIS — Z8249 Family history of ischemic heart disease and other diseases of the circulatory system: Secondary | ICD-10-CM | POA: Insufficient documentation

## 2011-07-08 ENCOUNTER — Encounter (HOSPITAL_COMMUNITY): Payer: Self-pay

## 2011-07-10 ENCOUNTER — Encounter (HOSPITAL_COMMUNITY): Payer: Self-pay

## 2011-07-12 ENCOUNTER — Encounter (HOSPITAL_COMMUNITY): Payer: Self-pay

## 2011-07-15 ENCOUNTER — Encounter (HOSPITAL_COMMUNITY): Payer: Self-pay

## 2011-07-17 ENCOUNTER — Encounter (HOSPITAL_COMMUNITY): Payer: Self-pay

## 2011-07-19 ENCOUNTER — Encounter (HOSPITAL_COMMUNITY): Payer: Self-pay

## 2011-07-22 ENCOUNTER — Encounter (HOSPITAL_COMMUNITY): Payer: Self-pay

## 2011-07-24 ENCOUNTER — Encounter (HOSPITAL_COMMUNITY): Payer: Self-pay

## 2011-07-26 ENCOUNTER — Encounter (HOSPITAL_COMMUNITY): Payer: Self-pay

## 2011-07-29 ENCOUNTER — Encounter (HOSPITAL_COMMUNITY)
Admission: RE | Admit: 2011-07-29 | Discharge: 2011-07-29 | Disposition: A | Discharge status: Home / Self Care | Payer: Self-pay | Source: Ambulatory Visit | Attending: Cardiovascular Disease | Admitting: Cardiovascular Disease

## 2011-07-31 ENCOUNTER — Encounter (HOSPITAL_COMMUNITY)
Admission: RE | Admit: 2011-07-31 | Discharge: 2011-07-31 | Disposition: A | Discharge status: Home / Self Care | Payer: Self-pay | Source: Ambulatory Visit | Attending: Cardiovascular Disease | Admitting: Cardiovascular Disease

## 2011-08-02 ENCOUNTER — Encounter (HOSPITAL_COMMUNITY)
Admission: RE | Admit: 2011-08-02 | Discharge: 2011-08-02 | Disposition: A | Discharge status: Home / Self Care | Payer: Self-pay | Source: Ambulatory Visit | Attending: Cardiovascular Disease | Admitting: Cardiovascular Disease

## 2011-08-05 ENCOUNTER — Encounter (HOSPITAL_COMMUNITY)
Admission: RE | Admit: 2011-08-05 | Discharge: 2011-08-05 | Disposition: A | Discharge status: Home / Self Care | Payer: Self-pay | Source: Ambulatory Visit | Attending: Cardiovascular Disease | Admitting: Cardiovascular Disease

## 2011-08-05 DIAGNOSIS — Z79899 Other long term (current) drug therapy: Secondary | ICD-10-CM | POA: Insufficient documentation

## 2011-08-05 DIAGNOSIS — I200 Unstable angina: Secondary | ICD-10-CM | POA: Insufficient documentation

## 2011-08-05 DIAGNOSIS — I252 Old myocardial infarction: Secondary | ICD-10-CM | POA: Insufficient documentation

## 2011-08-05 DIAGNOSIS — Z7982 Long term (current) use of aspirin: Secondary | ICD-10-CM | POA: Insufficient documentation

## 2011-08-05 DIAGNOSIS — Z8249 Family history of ischemic heart disease and other diseases of the circulatory system: Secondary | ICD-10-CM | POA: Insufficient documentation

## 2011-08-05 DIAGNOSIS — Z9861 Coronary angioplasty status: Secondary | ICD-10-CM | POA: Insufficient documentation

## 2011-08-05 DIAGNOSIS — E785 Hyperlipidemia, unspecified: Secondary | ICD-10-CM | POA: Insufficient documentation

## 2011-08-05 DIAGNOSIS — I2510 Atherosclerotic heart disease of native coronary artery without angina pectoris: Secondary | ICD-10-CM | POA: Insufficient documentation

## 2011-08-05 DIAGNOSIS — Z5189 Encounter for other specified aftercare: Secondary | ICD-10-CM | POA: Insufficient documentation

## 2011-08-05 DIAGNOSIS — I4589 Other specified conduction disorders: Secondary | ICD-10-CM | POA: Insufficient documentation

## 2011-08-07 ENCOUNTER — Encounter (HOSPITAL_COMMUNITY): Payer: Self-pay

## 2011-08-09 ENCOUNTER — Encounter (HOSPITAL_COMMUNITY)
Admission: RE | Admit: 2011-08-09 | Discharge: 2011-08-09 | Disposition: A | Discharge status: Home / Self Care | Payer: Self-pay | Source: Ambulatory Visit | Attending: Cardiovascular Disease | Admitting: Cardiovascular Disease

## 2011-08-12 ENCOUNTER — Encounter (HOSPITAL_COMMUNITY)
Admission: RE | Admit: 2011-08-12 | Discharge: 2011-08-12 | Disposition: A | Discharge status: Home / Self Care | Payer: Self-pay | Source: Ambulatory Visit | Attending: Cardiovascular Disease | Admitting: Cardiovascular Disease

## 2011-08-14 ENCOUNTER — Encounter (HOSPITAL_COMMUNITY)
Admission: RE | Admit: 2011-08-14 | Discharge: 2011-08-14 | Disposition: A | Discharge status: Home / Self Care | Payer: Self-pay | Source: Ambulatory Visit | Attending: Cardiovascular Disease | Admitting: Cardiovascular Disease

## 2011-08-16 ENCOUNTER — Encounter (HOSPITAL_COMMUNITY): Payer: Self-pay

## 2011-08-19 ENCOUNTER — Encounter (HOSPITAL_COMMUNITY)
Admission: RE | Admit: 2011-08-19 | Discharge: 2011-08-19 | Disposition: A | Discharge status: Home / Self Care | Payer: Self-pay | Source: Ambulatory Visit | Attending: Cardiovascular Disease | Admitting: Cardiovascular Disease

## 2011-08-21 ENCOUNTER — Encounter (HOSPITAL_COMMUNITY)
Admission: RE | Admit: 2011-08-21 | Discharge: 2011-08-21 | Disposition: A | Discharge status: Home / Self Care | Payer: Self-pay | Source: Ambulatory Visit | Attending: Cardiovascular Disease | Admitting: Cardiovascular Disease

## 2011-08-22 ENCOUNTER — Other Ambulatory Visit: Payer: Self-pay | Admitting: Internal Medicine

## 2011-08-23 ENCOUNTER — Encounter (HOSPITAL_COMMUNITY): Payer: Self-pay

## 2011-08-26 ENCOUNTER — Encounter (HOSPITAL_COMMUNITY): Payer: Self-pay

## 2011-08-28 ENCOUNTER — Encounter (HOSPITAL_COMMUNITY): Payer: Self-pay

## 2011-08-30 ENCOUNTER — Encounter (HOSPITAL_COMMUNITY): Payer: Self-pay

## 2011-09-01 ENCOUNTER — Other Ambulatory Visit: Payer: Self-pay | Admitting: Internal Medicine

## 2011-09-02 ENCOUNTER — Encounter (HOSPITAL_COMMUNITY): Payer: Self-pay

## 2011-09-04 ENCOUNTER — Encounter (HOSPITAL_COMMUNITY): Payer: Self-pay

## 2011-09-05 ENCOUNTER — Other Ambulatory Visit: Payer: Self-pay | Admitting: Internal Medicine

## 2011-09-06 ENCOUNTER — Encounter (HOSPITAL_COMMUNITY): Payer: Self-pay

## 2011-09-09 ENCOUNTER — Encounter (HOSPITAL_COMMUNITY): Payer: Self-pay

## 2011-09-11 ENCOUNTER — Encounter (HOSPITAL_COMMUNITY): Payer: Self-pay

## 2011-09-13 ENCOUNTER — Encounter (HOSPITAL_COMMUNITY): Payer: Self-pay

## 2011-09-13 ENCOUNTER — Other Ambulatory Visit (INDEPENDENT_AMBULATORY_CARE_PROVIDER_SITE_OTHER): Payer: BC Managed Care – PPO

## 2011-09-13 DIAGNOSIS — D649 Anemia, unspecified: Secondary | ICD-10-CM

## 2011-09-13 DIAGNOSIS — T887XXA Unspecified adverse effect of drug or medicament, initial encounter: Secondary | ICD-10-CM

## 2011-09-13 DIAGNOSIS — F528 Other sexual dysfunction not due to a substance or known physiological condition: Secondary | ICD-10-CM

## 2011-09-13 LAB — CBC WITH DIFFERENTIAL/PLATELET
HCT: 44.3 % (ref 39.0–52.0)
MCV: 92.6 fl (ref 78.0–100.0)
Platelets: 90 10*3/uL — ABNORMAL LOW (ref 150.0–400.0)
RDW: 13.8 % (ref 11.5–14.6)

## 2011-09-13 LAB — TESTOSTERONE: Testosterone: 303.53 ng/dL — ABNORMAL LOW (ref 350.00–890.00)

## 2011-09-13 LAB — HEPATIC FUNCTION PANEL
AST: 35 U/L (ref 0–37)
Albumin: 4.1 g/dL (ref 3.5–5.2)

## 2011-09-16 ENCOUNTER — Encounter (HOSPITAL_COMMUNITY): Payer: Self-pay

## 2011-09-18 ENCOUNTER — Encounter (HOSPITAL_COMMUNITY): Payer: Self-pay

## 2011-09-20 ENCOUNTER — Ambulatory Visit: Payer: BC Managed Care – PPO | Admitting: Internal Medicine

## 2011-09-20 ENCOUNTER — Encounter (HOSPITAL_COMMUNITY): Payer: Self-pay

## 2011-09-23 ENCOUNTER — Encounter (HOSPITAL_COMMUNITY): Payer: Self-pay

## 2011-09-25 ENCOUNTER — Encounter (HOSPITAL_COMMUNITY): Payer: Self-pay

## 2011-09-27 ENCOUNTER — Encounter: Payer: Self-pay | Admitting: Internal Medicine

## 2011-09-27 ENCOUNTER — Encounter (HOSPITAL_COMMUNITY): Payer: Self-pay

## 2011-09-27 ENCOUNTER — Ambulatory Visit (INDEPENDENT_AMBULATORY_CARE_PROVIDER_SITE_OTHER): Payer: BC Managed Care – PPO | Admitting: Internal Medicine

## 2011-09-27 VITALS — BP 122/82 | HR 87 | Temp 98.0°F | Wt 203.0 lb

## 2011-09-27 DIAGNOSIS — I2510 Atherosclerotic heart disease of native coronary artery without angina pectoris: Secondary | ICD-10-CM

## 2011-09-27 DIAGNOSIS — D72819 Decreased white blood cell count, unspecified: Secondary | ICD-10-CM

## 2011-09-27 DIAGNOSIS — L719 Rosacea, unspecified: Secondary | ICD-10-CM

## 2011-09-27 DIAGNOSIS — F4322 Adjustment disorder with anxiety: Secondary | ICD-10-CM

## 2011-09-27 DIAGNOSIS — D696 Thrombocytopenia, unspecified: Secondary | ICD-10-CM

## 2011-09-27 DIAGNOSIS — E291 Testicular hypofunction: Secondary | ICD-10-CM

## 2011-09-27 MED ORDER — TESTOSTERONE 30 MG/ACT TD SOLN: 2.0000 "application " | TRANSDERMAL | Status: DC

## 2011-09-27 NOTE — Patient Instructions (Signed)
Trial of changing to axiron 60 mg per day  ( 30 mg each axilla)   Check testosterone and psa in  2-3 months . Will call results and adjust dose depending on results. Otherwise CPX with labs  And testosterone level in 6 months

## 2011-09-27 NOTE — Progress Notes (Signed)
Subjective:    Patient ID: Jon Carter, male    DOB: 20-Apr-1951, 61 y.o.   MRN: [id number]  HPI Patient comes in today for follow up of  multiple medical problems.   Actually doing quite well CAD: no recurrent sx  Exercising and no limitation MOOD: coping and slowly getting better  Still with some anxiety Low testosterone : taking  3 pumps of androgel no se ? If helping fatigue PLTS: no bruising or bleeding Skin:  [place]ent [place] has red patches on face no edema on doxy for rocacea.  Traveled in [place] and then [place]   Some sun exposure.   Review of Systems Neg cp sob bleeding bruising gi gu changes new injury  Uri fever as per hpi  Past history family history social history reviewed in the electronic medical record. Outpatient Encounter Prescriptions as of 09/27/2011  Medication Sig Dispense Refill  . aspirin 81 MG tablet Take 81 mg by mouth daily.        . Cholecalciferol (VITAMIN D3) 1000 UNIT capsule Take 1,000 Units by mouth daily.        . Coenzyme Q10 150 MG CAPS Take by mouth.        . doxycycline (DORYX) 100 MG DR capsule Take 100 mg by mouth daily. Every other day       . metroNIDAZOLE (METROGEL) 1 % gel Apply topically daily.        . MULTIPLE VITAMIN PO Take by mouth.        . niacin (NIASPAN) 1000 MG CR tablet Take 1,000 mg by mouth at bedtime.        . nitroGLYCERIN (NITROSTAT) 0.4 MG SL tablet Place 0.4 mg under the tongue every 5 (five) minutes as needed.        . rosuvastatin (CRESTOR) 10 MG tablet Take 10 mg by mouth daily.        Marland Kitchen DISCONTD: ANDROGEL PUMP 20.25 MG/ACT (1.62%) GEL apply 3 ACTUATIONS once daily  75 g  2  . DISCONTD: zoster vaccine live, PF, (ZOSTAVAX) 40981 UNT/0.65ML injection Inject 19,400 Units into the skin once.  1 vial  0  . Testosterone (AXIRON) 30 MG/ACT SOLN Place 2 application onto the skin 1 day or 1 dose.  90 mL  3  . DISCONTD: clonazePAM (KLONOPIN) 1 MG tablet take 1 tablet by mouth twice a day  60 tablet  0      Objective:   Physical Exam  Wt Readings from Last 3 Encounters:  09/27/11 203 lb (92.08 kg)  05/14/11 197 lb (89.359 kg)  10/12/10 195 lb (88.451 kg)  wdwn in nad SKIN:  Red patches cheek bones mild rosacea otherwise. No bruising or bleeding Looks well minimally anxious  Labs reviewed with pt        Assessment & Plan:  Hypogonadism still not optimal levels but better  Disc options  No se   Will try different preb and go from there coupon for axiron 60   30 bid for now and then check level in 2 months or so Low plts stable no sx   Wbc borderline low otherwise nor. The same cad stable  Exercising well without sx  Secondary anxiety   Better still has sx when wakes in am but functioning .  Rosacea  Per derm Facial redness poss photoderm rx  But will check with derm about this ( he is also on niaspan) Liver tests still normal  Total visit >  50% spent counseling and coordinating care  labs in 2-3 months disc poss of test injections but will changes topicals for now.   Full check up in the fall 6 months.

## 2011-09-30 ENCOUNTER — Encounter (HOSPITAL_COMMUNITY): Payer: Self-pay

## 2011-10-01 ENCOUNTER — Telehealth: Payer: Self-pay | Admitting: Family Medicine

## 2011-10-01 NOTE — Telephone Encounter (Signed)
Patient's prior auth for Axiron was denied. Per insurance, the preferred and approved Rx is Androgel. Please advise.

## 2011-10-01 NOTE — Telephone Encounter (Signed)
Per Dr. Fabian Sharp- Can either go up to 4 pumps on androgel, call ins to see what else is covered.

## 2011-10-02 ENCOUNTER — Encounter (HOSPITAL_COMMUNITY): Payer: Self-pay

## 2011-10-02 MED ORDER — TESTOSTERONE 20.25 MG/ACT (1.62%) TD GEL: 4.0000 | Freq: Every day | TRANSDERMAL | Status: DC

## 2011-10-02 NOTE — Telephone Encounter (Signed)
Pt aware and rx called in  

## 2011-10-04 ENCOUNTER — Encounter (HOSPITAL_COMMUNITY): Payer: Self-pay

## 2011-11-01 ENCOUNTER — Other Ambulatory Visit: Payer: Self-pay | Admitting: Internal Medicine

## 2011-11-04 NOTE — Telephone Encounter (Signed)
Rx called in. Left message

## 2011-11-20 ENCOUNTER — Telehealth: Payer: Self-pay | Admitting: *Deleted

## 2011-11-20 NOTE — Telephone Encounter (Signed)
Androgel is too expensive and Ins will cover fortesta cheaper.

## 2011-11-21 MED ORDER — TESTOSTERONE 10 MG/ACT (2%) TD GEL: 40.0000 mg | Freq: Once | TRANSDERMAL | Status: DC

## 2011-11-21 NOTE — Telephone Encounter (Signed)
Please call in or have them pick up his new prescription. We will still start him at 4 pumps per day   it might be less potent and we have to increase I would like to repeat a testosterone level after 3 months on this new medication.

## 2011-11-22 NOTE — Telephone Encounter (Signed)
Left a message for pt to return call 

## 2011-12-19 ENCOUNTER — Other Ambulatory Visit (INDEPENDENT_AMBULATORY_CARE_PROVIDER_SITE_OTHER): Payer: BC Managed Care – PPO

## 2011-12-19 DIAGNOSIS — N529 Male erectile dysfunction, unspecified: Secondary | ICD-10-CM

## 2011-12-25 ENCOUNTER — Other Ambulatory Visit: Payer: Self-pay | Admitting: *Deleted

## 2011-12-25 MED ORDER — TESTOSTERONE 10 MG/ACT (2%) TD GEL: 4.0000 | Freq: Every day | TRANSDERMAL | Status: DC

## 2012-01-01 ENCOUNTER — Telehealth: Payer: Self-pay

## 2012-01-01 MED ORDER — TESTOSTERONE 10 MG/ACT (2%) TD GEL: 4.0000 | Freq: Every day | TRANSDERMAL | Status: DC

## 2012-01-01 NOTE — Telephone Encounter (Signed)
Pt called and states he was on testosterone medication  And it was changed to [place] and pt would like this called in to pharmacy.    Spoke with the pharmacy and the pt's rx had not been received.  Rx resent.

## 2012-04-02 HISTORY — PX: NM MYOCAR PERF WALL MOTION: HXRAD629

## 2012-04-07 ENCOUNTER — Telehealth: Payer: Self-pay | Admitting: Family Medicine

## 2012-04-07 NOTE — Telephone Encounter (Signed)
Pt called today and left a message.  The message was unclear.  It is possible he needs refills and does not want it sent to [facility].  I called him back and left a message on his voicemail.  Waiting on his reply.

## 2012-04-08 ENCOUNTER — Other Ambulatory Visit: Payer: Self-pay | Admitting: Internal Medicine

## 2012-04-08 MED ORDER — TESTOSTERONE 10 MG/ACT (2%) TD GEL: 4.0000 | Freq: Every day | TRANSDERMAL | Status: DC

## 2012-04-08 NOTE — Telephone Encounter (Signed)
90 day supply sent to [facility] per the pt.

## 2012-04-09 ENCOUNTER — Other Ambulatory Visit: Payer: Self-pay | Admitting: Family Medicine

## 2012-04-09 MED ORDER — TESTOSTERONE 10 MG/ACT (2%) TD GEL: 4.0000 | Freq: Every day | TRANSDERMAL | Status: DC

## 2012-04-20 ENCOUNTER — Other Ambulatory Visit: Payer: Self-pay | Admitting: Internal Medicine

## 2012-04-22 ENCOUNTER — Other Ambulatory Visit (INDEPENDENT_AMBULATORY_CARE_PROVIDER_SITE_OTHER): Payer: BC Managed Care – PPO

## 2012-04-22 ENCOUNTER — Ambulatory Visit: Payer: BC Managed Care – PPO

## 2012-04-22 DIAGNOSIS — F528 Other sexual dysfunction not due to a substance or known physiological condition: Secondary | ICD-10-CM

## 2012-04-22 DIAGNOSIS — Z0000 Encounter for general adult medical examination without abnormal findings: Secondary | ICD-10-CM

## 2012-04-22 DIAGNOSIS — D72829 Elevated white blood cell count, unspecified: Secondary | ICD-10-CM

## 2012-04-22 LAB — CBC WITH DIFFERENTIAL/PLATELET
Basophils Absolute: 0 10*3/uL (ref 0.0–0.1)
Basophils Relative: 0.1 % (ref 0.0–3.0)
HCT: 48.5 % (ref 39.0–52.0)
Hemoglobin: 16.1 g/dL (ref 13.0–17.0)
Lymphs Abs: 1.1 10*3/uL (ref 0.7–4.0)
MCHC: 33.2 g/dL (ref 30.0–36.0)
Monocytes Relative: 44.4 % — ABNORMAL HIGH (ref 3.0–12.0)
Neutro Abs: 0.9 10*3/uL — ABNORMAL LOW (ref 1.4–7.7)
RBC: 5.22 Mil/uL (ref 4.22–5.81)
RDW: 13.7 % (ref 11.5–14.6)

## 2012-04-22 LAB — BASIC METABOLIC PANEL
BUN: 12 mg/dL (ref 6–23)
Chloride: 104 mEq/L (ref 96–112)
GFR: 89.88 mL/min (ref >60.00)
Potassium: 4.2 mEq/L (ref 3.5–5.1)
Sodium: 138 mEq/L (ref 135–145)

## 2012-04-22 LAB — POCT URINALYSIS DIPSTICK
Bilirubin, UA: NEGATIVE
Glucose, UA: NEGATIVE
Ketones, UA: NEGATIVE
Leukocytes, UA: NEGATIVE
Protein, UA: NEGATIVE
Spec Grav, UA: 1.015

## 2012-04-22 LAB — TESTOSTERONE: Testosterone: 1390.67 ng/dL — ABNORMAL HIGH (ref 350.00–890.00)

## 2012-04-22 LAB — HEPATIC FUNCTION PANEL
AST: 45 U/L — ABNORMAL HIGH (ref 0–37)
Albumin: 4.1 g/dL (ref 3.5–5.2)
Alkaline Phosphatase: 37 U/L — ABNORMAL LOW (ref 39–117)
Total Protein: 7.4 g/dL (ref 6.0–8.3)

## 2012-04-22 LAB — TSH: TSH: 2.68 u[IU]/mL (ref 0.35–5.50)

## 2012-04-22 LAB — LIPID PANEL
Cholesterol: 121 mg/dL (ref 0–200)
VLDL: 15.6 mg/dL (ref 0.0–40.0)

## 2012-04-22 LAB — PSA: PSA: 0.52 ng/mL (ref 0.10–4.00)

## 2012-04-23 LAB — PATHOLOGIST SMEAR REVIEW

## 2012-04-24 ENCOUNTER — Other Ambulatory Visit: Payer: Self-pay | Admitting: Family Medicine

## 2012-04-24 NOTE — Telephone Encounter (Signed)
Called to the pharmacy and left on voicemail. 

## 2012-04-24 NOTE — Telephone Encounter (Signed)
Ok to refill  60 # prev sig. One time only   (Had been  using prn and  The ehr sometimes will have it drop off the med list or other)

## 2012-04-24 NOTE — Telephone Encounter (Signed)
Patient is requesting refills.  Last seen in a fu visit on 09/27/11 and has CPE scheduled on 04/29/12.   Last filled 05/14/11.  Looks like this was d/c in the chart.  Please advise.  Thanks!!!

## 2012-04-29 ENCOUNTER — Ambulatory Visit (INDEPENDENT_AMBULATORY_CARE_PROVIDER_SITE_OTHER): Payer: BC Managed Care – PPO | Admitting: Internal Medicine

## 2012-04-29 ENCOUNTER — Encounter: Payer: Self-pay | Admitting: Internal Medicine

## 2012-04-29 VITALS — BP 118/68 | HR 80 | Temp 97.8°F | Ht 71.5 in | Wt 201.0 lb

## 2012-04-29 DIAGNOSIS — D72819 Decreased white blood cell count, unspecified: Secondary | ICD-10-CM

## 2012-04-29 DIAGNOSIS — I2510 Atherosclerotic heart disease of native coronary artery without angina pectoris: Secondary | ICD-10-CM

## 2012-04-29 DIAGNOSIS — E785 Hyperlipidemia, unspecified: Secondary | ICD-10-CM

## 2012-04-29 DIAGNOSIS — R7989 Other specified abnormal findings of blood chemistry: Secondary | ICD-10-CM

## 2012-04-29 DIAGNOSIS — E291 Testicular hypofunction: Secondary | ICD-10-CM

## 2012-04-29 DIAGNOSIS — R5381 Other malaise: Secondary | ICD-10-CM

## 2012-04-29 DIAGNOSIS — L719 Rosacea, unspecified: Secondary | ICD-10-CM

## 2012-04-29 DIAGNOSIS — R5383 Other fatigue: Secondary | ICD-10-CM

## 2012-04-29 DIAGNOSIS — D696 Thrombocytopenia, unspecified: Secondary | ICD-10-CM

## 2012-04-29 DIAGNOSIS — Z0000 Encounter for general adult medical examination without abnormal findings: Secondary | ICD-10-CM

## 2012-04-29 NOTE — Patient Instructions (Signed)
Decrease the testosterone to 3 pumps a day  Check laboratory studies in about 3-4 months morning testosterone and B12 and CBC to check for the fatigue factors Try taking the Klonopin a little bit earlier in the evening in case this is causing morning fogginess.   At this time I don't think you need a bone density but will reviewed your chart for other factors.   Followup visit in 6-12 months or or as depending on labs.

## 2012-04-29 NOTE — Progress Notes (Signed)
Subjective:    Patient ID: Jon Carter, male    DOB: [birth year], 61 y.o.   MRN: [id number]  HPI Patient comes in today for Preventive Health Care visit  And fu of med issues  No major change in health status since last visit . He did have a recent stress test through Dr. Tresa Endo who said everything was fine he is to see him in 6 months. Is continuing his regular vigorous exercise without cardiovascular or pulmonary problem. Most recently when he adapted exercise he did have pain at both  Ischial  tuberosities hamstrings at the area where she he had a previous injury this was after doing vigorous stairstepping. This is improved since he has adapted his exercise over the past month but just wants to bring it up. He is taking 4 pumps of the for test at this time unsure piece noted any improvement he takes clonazepam a quarter in the evening.  Does complain of some decreased energy in the morning but feels really well on the afternoon. Wonders if he should take B12. He has no unusual bleeding fever or night sweats.  He has rosacea uses medicine for the past gets a bump on his right lower chin when he gets stressed and goes to urgent care and was given some medicine at that time.  Asks about whether he should get a bone density. Has no history of fracture nor family history of hip fracture.  Review of Systems ROS:  GEN/ HEENT: No fever, significant weight changes sweats headaches vision problems hearing changes, CV/ PULM; No chest pain shortness of breath cough, syncope,edema  change in exercise tolerance. GI /GU: No adominal pain, vomiting, change in bowel habits. No blood in the stool. No significant GU symptoms. SKIN/HEME: ,no acute skin rashes suspicious lesions or bleeding. No lymphadenopathy, nodules, masses.  NEURO/ PSYCH:  No neurologic signs such as weakness numbness.  IMM/ Allergy: No unusual infections.  Allergy .   No  REST of 12 system review negative except as per HPI Outpatient  Encounter Prescriptions as of 04/29/2012  Medication Sig Dispense Refill  . aspirin 81 MG tablet Take 81 mg by mouth daily.        . Cholecalciferol (VITAMIN D3) 1000 UNIT capsule Take 1,000 Units by mouth daily.        . clonazePAM (KLONOPIN) 1 MG tablet take 1 tablet by mouth twice a day taking 1/4 tab at night or as needed  60 tablet  0  . Coenzyme Q10 150 MG CAPS Take by mouth.        . doxycycline (DORYX) 100 MG DR capsule Take 100 mg by mouth daily. Every other day       . metroNIDAZOLE (METROGEL) 1 % gel Apply topically daily.        . MULTIPLE VITAMIN PO Take by mouth.        . niacin (NIASPAN) 1000 MG CR tablet Take 1,000 mg by mouth at bedtime.        . nitroGLYCERIN (NITROSTAT) 0.4 MG SL tablet Place 0.4 mg under the tongue every 5 (five) minutes as needed.        . rosuvastatin (CRESTOR) 10 MG tablet Take 10 mg by mouth daily.        . Testosterone (FORTESTA) 10 MG/ACT (2%) GEL Place 4 Act onto the skin daily.  180 g  0   Past history family history social history reviewed in the electronic medical record.      Objective:  Physical Exam BP 118/68  Pulse 80  Temp 97.8 F (36.6 C) (Oral)  Ht 5' 11.5" (1.816 m)  Wt 201 lb (91.173 kg)  BMI 27.64 kg/m2  SpO2 98% Physical Exam: Vital signs reviewed [id number] is a well-developed well-nourished alert cooperative  White male  who appears   stated age in no acute distress.  HEENT: normocephalic  traumatic , Eyes: PERRL EOM's full, conjunctiva clear, Nares: patent no deformity discharge or tenderness., Ears: no deformity EAC's clear TMs with normal landmarks. Mouth: clear OP, no lesions, edema.  Moist mucous membranes. Dentition in adequate repair. NECK: supple without masses, thyromegaly or bruits. CHEST/PULM:  Clear to auscultation andbreath sounds equal no wheeze , rales or rhonchi. No chest wall deformities or tenderness. CV: PMI is nondisplaced, S1 S2 no gallops, murmurs, rubs. Peripheral pulses are full without delayABDOMEN:  Bowel sounds normal nontender  No guard or rebound, no hepato splenomegal no CVA tenderness.   Extremtities:  No clubbing cyanosis or edema, no acute joint swelling or redness no focal atrophy gait normal  NEURO:  Oriented x3, cranial nerves 3-12 appear to be intact, no obvious focal weakness,gait within normal limits no abnormal reflexes or asymmetrical SKIN: No acute rashes  Some facial erythema  Rosacea r, no bruising or petechiae. PSYCH: Oriented, good eye contact, no obvious depression mildly anxious  cognition and judgment appear normal. LN:  No cervical axillary or inguinal adenopathy RECTAL:  Prostate no nodules or sig tenderness.   1+   Lab Results  Component Value Date   WBC 3.5 Repeated and verified X2.* 04/22/2012   HGB 16.1 04/22/2012   HCT 48.5 04/22/2012   PLT 65.0* 04/22/2012   GLUCOSE 90 04/22/2012   CHOL 121 04/22/2012   TRIG 78.0 04/22/2012   HDL 43.40 04/22/2012   LDLDIRECT 128.7 01/04/2009   LDLCALC 62 04/22/2012   ALT 45 04/22/2012   AST 45* 04/22/2012   NA 138 04/22/2012   K 4.2 04/22/2012   CL 104 04/22/2012   CREATININE 0.9 04/22/2012   BUN 12 04/22/2012   CO2 29 04/22/2012   TSH 2.68 04/22/2012   PSA 0.52 04/22/2012   INR 1.1 05/01/2009       Assessment & Plan:   Preventive Health Care Counseled regarding healthy nutrition, exercise, sleep, injury prevention, calcium vit d and healthy weight . UTD  Low testosterone  Level much higher on change to fortesta   Decrease to 3 pumps per day and check labs in  About 3 months   Asks about a.m. malaise and fatigue this could be a little residual of the low dose Klonopin he takes at night he can take it earlier or not we can check a B12 level at his next lab test as he asks if this could be causing a problem. He doesn't feel bad at all afternoons.  Leukopenia low platelets neutropenia without symptoms previous evaluation a few years ago with hematology felt could just be followed. Prefers to not recheck with them unless needed  ,he has no abnormality on exam we'll recheck the blood count when he comes for his testosterone level.  Abnormal LFTs with history of same we'll have to review his chart as to the presumed cause probably medication he feels well.  Do not think he needs a bone density at this time based on his history risk and no history of fracture.  Discussed the sorb buttocks area which is probably a hamstring pull from before adapting exercise do not think these are alarm  findings.  Rosacea skin is on medication niacin to possibly be aggravating.  Coronary artery disease status post MI stable recent stress test low risk.  Anxiety sleep is stable at this time but still tends to be a bit anxious  continue current plan.   Review of electronic I do not see his hepatitis B and C. serology is usually done with abnormal LFTs this could have been done and the paper world or previous electronic record and may repeat these when he gets his next blood draw. He did have an ultrasound of his abdomen a couple years ago which showed no liver problem.

## 2012-07-31 ENCOUNTER — Other Ambulatory Visit (INDEPENDENT_AMBULATORY_CARE_PROVIDER_SITE_OTHER): Payer: BC Managed Care – PPO

## 2012-07-31 DIAGNOSIS — R5383 Other fatigue: Secondary | ICD-10-CM

## 2012-07-31 DIAGNOSIS — R7989 Other specified abnormal findings of blood chemistry: Secondary | ICD-10-CM

## 2012-07-31 DIAGNOSIS — L719 Rosacea, unspecified: Secondary | ICD-10-CM

## 2012-07-31 DIAGNOSIS — E291 Testicular hypofunction: Secondary | ICD-10-CM

## 2012-07-31 DIAGNOSIS — E785 Hyperlipidemia, unspecified: Secondary | ICD-10-CM

## 2012-07-31 DIAGNOSIS — Z0000 Encounter for general adult medical examination without abnormal findings: Secondary | ICD-10-CM

## 2012-07-31 DIAGNOSIS — I2510 Atherosclerotic heart disease of native coronary artery without angina pectoris: Secondary | ICD-10-CM

## 2012-07-31 DIAGNOSIS — D72819 Decreased white blood cell count, unspecified: Secondary | ICD-10-CM

## 2012-07-31 DIAGNOSIS — D696 Thrombocytopenia, unspecified: Secondary | ICD-10-CM

## 2012-07-31 LAB — CBC WITH DIFFERENTIAL/PLATELET
Basophils Absolute: 0 10*3/uL (ref 0.0–0.1)
Eosinophils Relative: 0.2 % (ref 0.0–5.0)
MCV: 92.4 fl (ref 78.0–100.0)
Monocytes Absolute: 1.1 10*3/uL — ABNORMAL HIGH (ref 0.1–1.0)
Neutrophils Relative %: 38.7 % — ABNORMAL LOW (ref 43.0–77.0)
Platelets: 82 10*3/uL — ABNORMAL LOW (ref 150.0–400.0)
WBC: 4.3 10*3/uL — ABNORMAL LOW (ref 4.5–10.5)

## 2012-07-31 LAB — IBC PANEL
Iron: 135 ug/dL (ref 42–165)
Saturation Ratios: 38.6 % (ref 20.0–50.0)

## 2012-07-31 LAB — HEPATIC FUNCTION PANEL
ALT: 55 U/L — ABNORMAL HIGH (ref 0–53)
Bilirubin, Direct: 0.2 mg/dL (ref 0.0–0.3)
Total Bilirubin: 0.8 mg/dL (ref 0.3–1.2)

## 2012-07-31 LAB — VITAMIN B12: Vitamin B-12: >1500 pg/mL — ABNORMAL HIGH (ref 211–911)

## 2012-08-01 LAB — HEPATITIS B CORE ANTIBODY, TOTAL: Hep B Core Total Ab: NEGATIVE

## 2012-08-06 ENCOUNTER — Telehealth: Payer: Self-pay | Admitting: Internal Medicine

## 2012-08-06 ENCOUNTER — Other Ambulatory Visit: Payer: Self-pay | Admitting: Family Medicine

## 2012-08-06 DIAGNOSIS — R7989 Other specified abnormal findings of blood chemistry: Secondary | ICD-10-CM

## 2012-08-06 NOTE — Telephone Encounter (Signed)
Pt req to come by today at 2:30pm to pick up copy of lab results.

## 2012-08-06 NOTE — Telephone Encounter (Signed)
Placed upfront at the desk.

## 2012-09-07 ENCOUNTER — Other Ambulatory Visit (INDEPENDENT_AMBULATORY_CARE_PROVIDER_SITE_OTHER): Payer: BC Managed Care – PPO

## 2012-09-07 ENCOUNTER — Other Ambulatory Visit: Payer: BC Managed Care – PPO

## 2012-09-07 DIAGNOSIS — R7989 Other specified abnormal findings of blood chemistry: Secondary | ICD-10-CM

## 2012-09-07 DIAGNOSIS — E291 Testicular hypofunction: Secondary | ICD-10-CM

## 2012-09-07 LAB — TESTOSTERONE: Testosterone: 303 ng/dL — ABNORMAL LOW (ref 350.00–890.00)

## 2012-09-09 ENCOUNTER — Other Ambulatory Visit: Payer: Self-pay | Admitting: Family Medicine

## 2012-09-09 DIAGNOSIS — R7989 Other specified abnormal findings of blood chemistry: Secondary | ICD-10-CM

## 2012-09-11 ENCOUNTER — Telehealth: Payer: Self-pay | Admitting: Internal Medicine

## 2012-09-11 NOTE — Telephone Encounter (Signed)
Pt would like copy of testosterone labs. Mail is ok. Pt would like to discuss changing his Testosterone (FORTESTA) 10 MG/ACT (2%) GEL He is having some adverse reactions (acne, burning upon application,redness) Pls call pt cell: [phone]

## 2012-09-14 NOTE — Telephone Encounter (Signed)
Please get him a copy   Of labs  . Uncertain  Which prep will help that is covered by insurance    We can get  Urology or  Endocrine  To give opinion.  Maybe replacement  is not helping him at all? Does he want [name] to refer fo opinion?

## 2012-09-16 NOTE — Telephone Encounter (Signed)
Spoke to the pt.  He does not want to go see a specialist.  He will call his insurance company and see what other alternatives will be covered and call me back.  He is interested in testosterone injections.

## 2012-09-16 NOTE — Telephone Encounter (Signed)
Have  Him check and then we can make a decision

## 2012-09-16 NOTE — Telephone Encounter (Signed)
Left message on number provided for the pt to return my call.

## 2012-09-25 ENCOUNTER — Other Ambulatory Visit: Payer: Self-pay | Admitting: Internal Medicine

## 2012-09-25 MED ORDER — TESTOSTERONE 4 MG/24HR TD PT24: 1.0000 | MEDICATED_PATCH | Freq: Every day | TRANSDERMAL | Status: DC

## 2012-09-29 ENCOUNTER — Telehealth: Payer: Self-pay | Admitting: Family Medicine

## 2012-09-29 NOTE — Telephone Encounter (Signed)
Patient called and left a message on my voicemail.  He is now out of testosterone.  Holly did get [employer] approved through [employer].  Patient does not want this because he has to wear the patch 24-7.  He goes to swim and is unable to do this.  Left a message at home number for the pt to call back and tell me what he would like to do next.

## 2012-09-29 NOTE — Telephone Encounter (Signed)
Patient has decided to go forth with Testim transdermal gel.  Please send directions.  He would like to try this for 1 month.  Advise.  Thanks!!!

## 2012-10-05 ENCOUNTER — Other Ambulatory Visit: Payer: Self-pay | Admitting: Family Medicine

## 2012-10-05 MED ORDER — TESTOSTERONE 50 MG/5GM (1%) TD GEL: 10.0000 g | Freq: Every day | TRANSDERMAL | Status: DC

## 2012-10-05 NOTE — Telephone Encounter (Signed)
Wow   What a lot of work to get a medication.  Go back to androgel   1.62; 4 pumps per day   Check level testosterone level in 1 month   And ov

## 2012-10-05 NOTE — Telephone Encounter (Signed)
I received a prior auth request on Jon Carter today from [facility] for the Testim. This is the third switch he has made on his testosterone med, and here is the problem: the first time he wanted to get the testosterone approved, he gave [name]. At that time, the rx was for Androgel, but it was not preferred for [employer] - Testim was on their preferred list.  However, the last 2 times he's decided to switch testosterone meds, he has given the pharmacy [employer]. Their preferred med list is different than [employer]. At a that time, [name] went with the Androderm patch (which is covered), but changed his mind after I got it approved. Now he wants Testim, but Testim is not on BCBS preferred list, there fore [employer] is not going to pay for it. Androgel is their preferred. If Jon Carter wants his insurance to pay for his testosterone, these are his options:  If he files BCBS: Androderm patch or Androgel. If he files T[employer]: Testim (if he's had a reaction to [name], which he has).   I am not going to do the prior auth on the Testim for [employer], because they aren't going to pay for it. Please advise.

## 2012-10-05 NOTE — Telephone Encounter (Signed)
Get testosterone level in about a month

## 2012-10-05 NOTE — Telephone Encounter (Signed)
I spoke with Jon Carter. I have explained what the situation is. He would like to go back to the ANDROGEL PUMP 20.25 MG/ACT (1.62%) GEL [[id number]] Since it is preferred w/ BCBS [place]. Please send to [facility]. I submitted the prior auth.

## 2012-10-06 ENCOUNTER — Encounter: Payer: Self-pay | Admitting: Internal Medicine

## 2012-10-06 ENCOUNTER — Other Ambulatory Visit: Payer: Self-pay | Admitting: Family Medicine

## 2012-10-06 DIAGNOSIS — E291 Testicular hypofunction: Secondary | ICD-10-CM

## 2012-10-06 NOTE — Telephone Encounter (Signed)
Placed in the system

## 2012-10-08 ENCOUNTER — Other Ambulatory Visit: Payer: Self-pay | Admitting: Family Medicine

## 2012-10-08 MED ORDER — TESTOSTERONE 20.25 MG/ACT (1.62%) TD GEL: 4.0000 "application " | Freq: Every day | TRANSDERMAL | Status: DC

## 2012-10-08 NOTE — Telephone Encounter (Signed)
Misty, I see the Androgel 1.62% on the med list, but I can't find an active rx sent in to [facility]. Can you send this in or let me know if I missed it? I haven't received anything from the pharm.

## 2012-10-08 NOTE — Telephone Encounter (Signed)
Faxed to the pharmacy.

## 2012-10-12 ENCOUNTER — Other Ambulatory Visit: Payer: BC Managed Care – PPO

## 2012-10-13 ENCOUNTER — Telehealth: Payer: Self-pay | Admitting: Internal Medicine

## 2012-10-13 NOTE — Telephone Encounter (Signed)
Misty, [name]'s Androgel 1/62% was approved by insurance. However, the pharmacy wanted [name] to be aware that they way the rx was written, Testosterone (ANDROGEL PUMP) 20.25 MG/ACT (1.62%) GEL 75 g, will only cover him for 15 days. 150g is for 30 days. Also, it was written with no refills. I wasn't sure if 15 days with no refills was your intention, so wanted to ask. If not, please send in modified rx to [facility]. Thank you.

## 2012-10-18 ENCOUNTER — Other Ambulatory Visit: Payer: Self-pay | Admitting: Internal Medicine

## 2012-10-19 ENCOUNTER — Other Ambulatory Visit: Payer: Self-pay | Admitting: Family Medicine

## 2012-10-19 MED ORDER — TESTOSTERONE 20.25 MG/ACT (1.62%) TD GEL: 4.0000 "application " | Freq: Every day | TRANSDERMAL | Status: DC

## 2012-10-19 NOTE — Telephone Encounter (Signed)
New rx faxed to the pharmacy and confirmation received.

## 2012-10-23 ENCOUNTER — Ambulatory Visit: Payer: BC Managed Care – PPO | Admitting: Internal Medicine

## 2012-10-26 ENCOUNTER — Other Ambulatory Visit (INDEPENDENT_AMBULATORY_CARE_PROVIDER_SITE_OTHER): Payer: BC Managed Care – PPO

## 2012-10-26 DIAGNOSIS — E291 Testicular hypofunction: Secondary | ICD-10-CM

## 2012-10-26 LAB — TESTOSTERONE: Testosterone: 374.37 ng/dL (ref 350.00–890.00)

## 2012-10-31 ENCOUNTER — Other Ambulatory Visit: Payer: Self-pay | Admitting: Internal Medicine

## 2012-11-02 ENCOUNTER — Other Ambulatory Visit: Payer: Self-pay | Admitting: Family Medicine

## 2012-11-02 MED ORDER — TESTOSTERONE 20.25 MG/ACT (1.62%) TD GEL: 4.0000 "application " | Freq: Every day | TRANSDERMAL | Status: DC

## 2012-11-09 ENCOUNTER — Telehealth: Payer: Self-pay | Admitting: Family Medicine

## 2012-11-09 ENCOUNTER — Other Ambulatory Visit: Payer: Self-pay | Admitting: Family Medicine

## 2012-11-09 NOTE — Telephone Encounter (Signed)
I spoke to the pt to give him lab results.  He informed me that he has not been taking any testosterone at all.  Has not used it in 6 weeks.  Please advise.  I did schedule him for future labs.

## 2012-11-12 NOTE — Telephone Encounter (Signed)
Would then stay off of the testosterone for now and check labs   In 2 months instead of 3  months

## 2012-11-13 NOTE — Telephone Encounter (Signed)
Patient notified by telephone. 

## 2013-01-15 ENCOUNTER — Other Ambulatory Visit: Payer: Self-pay | Admitting: Internal Medicine

## 2013-01-20 ENCOUNTER — Telehealth: Payer: Self-pay | Admitting: Cardiovascular Disease

## 2013-01-20 MED ORDER — ROSUVASTATIN CALCIUM 10 MG PO TABS: 10.0000 mg | ORAL_TABLET | Freq: Every day | ORAL | Status: DC

## 2013-01-20 NOTE — Telephone Encounter (Signed)
Returned call.  Pt stated he has been trying to get a refill on Crestor all week.  Stated the pharmacy has not heard back from the office yet.  Stated they have faxed requests every day this week.  Pt. Informed refill will be sent to [facility].  Pt verbalized understanding and agreed w/ plan.

## 2013-01-20 NOTE — Telephone Encounter (Signed)
Would you please take care of his medicine today,He is completely out of it!

## 2013-02-08 ENCOUNTER — Other Ambulatory Visit (INDEPENDENT_AMBULATORY_CARE_PROVIDER_SITE_OTHER): Payer: BC Managed Care – PPO

## 2013-02-08 DIAGNOSIS — E291 Testicular hypofunction: Secondary | ICD-10-CM

## 2013-02-08 DIAGNOSIS — R7989 Other specified abnormal findings of blood chemistry: Secondary | ICD-10-CM

## 2013-02-08 LAB — CBC WITH DIFFERENTIAL/PLATELET
Basophils Absolute: 0 10*3/uL (ref 0.0–0.1)
Eosinophils Absolute: 0 10*3/uL (ref 0.0–0.7)
HCT: 44.8 % (ref 39.0–52.0)
Lymphs Abs: 1.5 10*3/uL (ref 0.7–4.0)
MCV: 93.6 fl (ref 78.0–100.0)
Monocytes Absolute: 1.4 10*3/uL — ABNORMAL HIGH (ref 0.1–1.0)
Neutrophils Relative %: 37.7 % — ABNORMAL LOW (ref 43.0–77.0)
Platelets: 83 10*3/uL — ABNORMAL LOW (ref 150.0–400.0)
RDW: 13.5 % (ref 11.5–14.6)
WBC: 4.7 10*3/uL (ref 4.5–10.5)

## 2013-02-08 LAB — PLATELET COUNT: Platelets: 98 10*3/uL — ABNORMAL LOW (ref 150–400)

## 2013-02-08 LAB — PSA: PSA: 0.42 ng/mL (ref 0.10–4.00)

## 2013-02-11 ENCOUNTER — Telehealth: Payer: Self-pay | Admitting: Family Medicine

## 2013-02-11 NOTE — Telephone Encounter (Signed)
Do you want the patient to have regular physical labs plus repeat of what he just had?

## 2013-02-19 NOTE — Telephone Encounter (Signed)
Just reg cpx labs   Should include the cbcdiff plt    dont think he needs  Testosterone level  As he is not on meds for this at this time.

## 2013-02-22 ENCOUNTER — Other Ambulatory Visit: Payer: Self-pay | Admitting: Family Medicine

## 2013-02-22 DIAGNOSIS — Z0000 Encounter for general adult medical examination without abnormal findings: Secondary | ICD-10-CM

## 2013-02-22 NOTE — Telephone Encounter (Signed)
Order placed in the system. 

## 2013-03-26 ENCOUNTER — Ambulatory Visit: Payer: BC Managed Care – PPO | Admitting: Cardiovascular Disease

## 2013-03-26 ENCOUNTER — Ambulatory Visit (INDEPENDENT_AMBULATORY_CARE_PROVIDER_SITE_OTHER): Payer: BC Managed Care – PPO | Admitting: Cardiovascular Disease

## 2013-03-26 VITALS — BP 126/76 | HR 71 | Ht 72.0 in | Wt 210.4 lb

## 2013-03-26 DIAGNOSIS — D696 Thrombocytopenia, unspecified: Secondary | ICD-10-CM

## 2013-03-26 DIAGNOSIS — I2510 Atherosclerotic heart disease of native coronary artery without angina pectoris: Secondary | ICD-10-CM

## 2013-03-26 DIAGNOSIS — E785 Hyperlipidemia, unspecified: Secondary | ICD-10-CM

## 2013-03-26 NOTE — Patient Instructions (Addendum)
Your physician recommends that you schedule a follow-up appointment in: 6 months  

## 2013-04-10 ENCOUNTER — Encounter: Payer: Self-pay | Admitting: Cardiovascular Disease

## 2013-04-10 NOTE — Progress Notes (Signed)
Patient ID: Jon Carter, male   DOB: 01/06/1951, 62 y.o.   MRN: [id number]     HPI: Jon Carter, is a 62 y.o. male who presents to the office today for six-month cardiology evaluation.  Jon Carter is now 4 years status post his presentation with an acute coronary syndrome/ST segment elevation myocardial infarction on 04/26/2009. At that time, I took them abruptly to the cardiac catheterization laboratory where he was found to have total occlusion of his mid circumflex coronary artery. He had an excellent portable time of less than 40 minutes and ultimately had a science DES 2.75x18 mm stent inserted into the circumflex, postdilated to 3.0 mm. Subsequently, he has been demonstrated to have complete salvage of myocardium. Initially, he did have significant anxiety issues following his acute coronary syndrome. Ultimately, this has significantly improved.  He states over the past 6 months he has felt exceptional. He is exercising regularly. He denies any recurrent episodes of chest pain he denies tachycardia palpitations. He does does has have issues with thrombocytopenia but he denies any recent bleeding. He is unaware of any change in exercise tolerance. His last stress test was in August 2013 which continues to show normal perfusion.  Past Medical History  Diagnosis Date  . Hyperlipidemia      Hx of low HDL zero CAC in 2000 at [facility] Rad  . STEMI (ST elevation myocardial infarction)     left circumflex DE stent  August 2010 ni stress test  . Alcohol addiction     Past Surgical History  Procedure Laterality Date  . Cataract extraction  2009    left eye  [place]    Allergies  Allergen Reactions  . Escitalopram Oxalate     REACTION: flushing of the skin and sick all over ?  only took 1 pill    Current Outpatient Prescriptions  Medication Sig Dispense Refill  . aspirin 81 MG tablet Take 81 mg by mouth daily.        . Cholecalciferol (VITAMIN D3) 1000 UNIT capsule Take 1,000 Units  by mouth daily.        . clonazePAM (KLONOPIN) 1 MG tablet       . Coenzyme Q10 150 MG CAPS Take by mouth.        . doxycycline (DORYX) 100 MG DR capsule Take 100 mg by mouth daily. Every other day      . metroNIDAZOLE (METROGEL) 1 % gel Apply topically daily.        . MULTIPLE VITAMIN PO Take by mouth.        . niacin (NIASPAN) 1000 MG CR tablet Take 1,000 mg by mouth at bedtime.        . nitroGLYCERIN (NITROSTAT) 0.4 MG SL tablet Place 0.4 mg under the tongue every 5 (five) minutes as needed.        . rosuvastatin (CRESTOR) 10 MG tablet Take 1 tablet (10 mg total) by mouth daily.  30 tablet  10   No current facility-administered medications for this visit.    History   Social History  . Marital Status: Married    Spouse Name: N/A    Number of Children: N/A  . Years of Education: N/A   Occupational History  . phd teaching [employer]   Social History Main Topics  . Smoking status: Never Smoker   . Smokeless tobacco: Not on file  . Alcohol Use: 1.0 oz/week    2 drink(s) per week  . Drug Use:  No  . Sexually Active: Not on file   Other Topics Concern  . 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 Sharee Pimple. He does travel. He exercises daily. There is no tobacco use.  ROS is negative for fevers, chills or night sweats. No bleeding or rashes. No angina. No PND or orthopnea. No abdominal pain. He feels well. The past he has had issues with low testosterone. He denies change of bowel or bladder habits. There is no edema.  Other system review is negative.  PE BP 126/76  Pulse 71  Ht 6' (1.829 m)  Wt 210 lb 6.4 oz (95.437 kg)  BMI 28.53 kg/m2  General: Alert, oriented, no distress.  Skin: normal turgor, no rashes HEENT: Normocephalic,  atraumatic. Pupils round and reactive; sclera anicteric;no lid lag.  Nose without nasal septal hypertrophy Mouth/Parynx benign; Mallinpatti scale 2 Neck: No JVD, no carotid briuts No chest wall tenderness Lungs: clear to ausculatation and percussion; no wheezing or rales Heart: RRR, s1 s2 normal  Abdomen: soft, nontender; no hepatosplenomehaly, BS+; abdominal aorta nontender and not dilated by palpation. Pulses 2+ Extremities: no clubbing cyanosis or edema, Homan's sign negative  Neurologic: grossly nonfocal  ECG: Normal sinus rhythm at 71 beats per minute. Normal intervals.  LABS:  BMET    Component Value Date/Time   NA 138 04/22/2012 0810   K 4.2 04/22/2012 0810   CL 104 04/22/2012 0810   CO2 29 04/22/2012 0810   GLUCOSE 90 04/22/2012 0810   BUN 12 04/22/2012 0810   CREATININE 0.9 04/22/2012 0810   CALCIUM 9.3 04/22/2012 0810   GFRNONAA 91.78 11/24/2009 0907   GFRAA  Value: >60        The eGFR has been calculated using the MDRD equation. This calculation has not been validated in all clinical situations. eGFR's persistently <60 mL/min signify possible Chronic Kidney Disease. 05/03/2009 0507     Hepatic Function Panel     Component Value Date/Time   PROT 8.6* 07/31/2012 1009   ALBUMIN 4.4 07/31/2012 1009   AST 47* 07/31/2012 1009   ALT 55* 07/31/2012 1009   ALKPHOS 43 07/31/2012 1009   BILITOT 0.8 07/31/2012 1009   BILIDIR 0.2 07/31/2012 1009   IBILI 0.8 05/01/2009 2155     CBC    Component Value Date/Time   WBC 4.7 02/08/2013 1407   WBC 3.9* 07/12/2010 1244   RBC 4.79 02/08/2013 1407   RBC 5.06 07/12/2010 1244   HGB 14.8 02/08/2013 1407   HGB 15.4 07/12/2010 1244   HCT 44.8 02/08/2013 1407   HCT 45.6 07/12/2010 1244   PLT 83.0* 02/08/2013 1407   PLT 98* 02/08/2013 1407   PLT 89* 07/12/2010 1244   MCV 93.6 02/08/2013 1407   MCV 90.1 07/12/2010 1244   MCH 30.4 07/12/2010 1244   MCHC 33.0 02/08/2013 1407   MCHC 33.8 07/12/2010 1244   RDW 13.5 02/08/2013 1407   RDW 13.1 07/12/2010  1244   LYMPHSABS 1.5 02/08/2013 1407   LYMPHSABS 1.5 07/12/2010 1244   MONOABS 1.4* 02/08/2013 1407   MONOABS 0.9 07/12/2010 1244   EOSABS 0.0 02/08/2013 1407   EOSABS 0.0 07/12/2010 1244   BASOSABS 0.0 02/08/2013 1407  BASOSABS 0.0 07/12/2010 1244     BNP    Component Value Date/Time   PROBNP 99.0 04/28/2009 0420    Lipid Panel     Component Value Date/Time   CHOL 121 04/22/2012 0810   TRIG 78.0 04/22/2012 0810   HDL 43.40 04/22/2012 0810   CHOLHDL 3 04/22/2012 0810   VLDL 15.6 04/22/2012 0810   LDLCALC 62 04/22/2012 0810     RADIOLOGY: No results found.    ASSESSMENT AND PLAN: Clinically, [name] continues to do well now almost 4 years following his acute coronary syndrome when he presented with left circumflex occlusion and underwent successful prompt early revascularization. He subsequently has had complete salvage of myocardium verified by nuclear imaging to presently, his blood pressure is controlled. He's not having palpitations. We have been aggressive in treating his lipids and he is tolerating combination therapy with Niaspan 1000 mg and Crestor 10 mg daily.  Laboratory last year showed total cholesterol 121 triglyceride 78 HDL 43 and LDL cholesterol 62. Dr. Fabian Sharp will be checking laboratory. As long as he remains stable, I will see him in 6 months for further evaluation.     Lennette Bihari, MD, [facility]  04/10/2013 3:04 PM

## 2013-04-12 ENCOUNTER — Encounter: Payer: Self-pay | Admitting: Cardiovascular Disease

## 2013-04-27 ENCOUNTER — Other Ambulatory Visit (INDEPENDENT_AMBULATORY_CARE_PROVIDER_SITE_OTHER): Payer: BC Managed Care – PPO

## 2013-04-27 DIAGNOSIS — Z0000 Encounter for general adult medical examination without abnormal findings: Secondary | ICD-10-CM

## 2013-04-27 LAB — CBC WITH DIFFERENTIAL/PLATELET
Basophils Absolute: 0 10*3/uL (ref 0.0–0.1)
Eosinophils Absolute: 0 10*3/uL (ref 0.0–0.7)
HCT: 44.8 % (ref 39.0–52.0)
Hemoglobin: 15.4 g/dL (ref 13.0–17.0)
Lymphs Abs: 1.6 10*3/uL (ref 0.7–4.0)
MCHC: 34.4 g/dL (ref 30.0–36.0)
MCV: 90.7 fl (ref 78.0–100.0)
Monocytes Absolute: 1.2 10*3/uL — ABNORMAL HIGH (ref 0.1–1.0)
Monocytes Relative: 29.9 % — ABNORMAL HIGH (ref 3.0–12.0)
Neutro Abs: 1.3 10*3/uL — ABNORMAL LOW (ref 1.4–7.7)
Platelets: 82 10*3/uL — ABNORMAL LOW (ref 150.0–400.0)
RDW: 14 % (ref 11.5–14.6)

## 2013-04-27 LAB — BASIC METABOLIC PANEL
BUN: 15 mg/dL (ref 6–23)
Calcium: 9.2 mg/dL (ref 8.4–10.5)
Chloride: 104 mEq/L (ref 96–112)
Creatinine, Ser: 0.9 mg/dL (ref 0.4–1.5)

## 2013-04-27 LAB — HEPATIC FUNCTION PANEL
AST: 35 U/L (ref 0–37)
Bilirubin, Direct: 0.1 mg/dL (ref 0.0–0.3)
Total Bilirubin: 0.6 mg/dL (ref 0.3–1.2)

## 2013-04-27 LAB — TSH: TSH: 3.63 u[IU]/mL (ref 0.35–5.50)

## 2013-04-27 LAB — LIPID PANEL
Cholesterol: 137 mg/dL (ref 0–200)
VLDL: 20.4 mg/dL (ref 0.0–40.0)

## 2013-05-10 ENCOUNTER — Other Ambulatory Visit: Payer: Self-pay | Admitting: *Deleted

## 2013-05-10 MED ORDER — NIACIN ER (ANTIHYPERLIPIDEMIC) 1000 MG PO TBCR: 1000.0000 mg | EXTENDED_RELEASE_TABLET | Freq: Every day | ORAL | Status: DC

## 2013-05-10 NOTE — Telephone Encounter (Signed)
Rx was sent to pharmacy electronically. 

## 2013-05-11 ENCOUNTER — Encounter: Payer: Self-pay | Admitting: Internal Medicine

## 2013-05-11 ENCOUNTER — Ambulatory Visit (INDEPENDENT_AMBULATORY_CARE_PROVIDER_SITE_OTHER): Payer: BC Managed Care – PPO | Admitting: Internal Medicine

## 2013-05-11 VITALS — BP 124/78 | HR 87 | Temp 98.3°F | Ht 71.0 in | Wt 211.0 lb

## 2013-05-11 DIAGNOSIS — D696 Thrombocytopenia, unspecified: Secondary | ICD-10-CM

## 2013-05-11 DIAGNOSIS — E785 Hyperlipidemia, unspecified: Secondary | ICD-10-CM

## 2013-05-11 DIAGNOSIS — Z0000 Encounter for general adult medical examination without abnormal findings: Secondary | ICD-10-CM

## 2013-05-11 DIAGNOSIS — Z23 Encounter for immunization: Secondary | ICD-10-CM

## 2013-05-11 DIAGNOSIS — I2510 Atherosclerotic heart disease of native coronary artery without angina pectoris: Secondary | ICD-10-CM

## 2013-05-11 DIAGNOSIS — L719 Rosacea, unspecified: Secondary | ICD-10-CM

## 2013-05-11 NOTE — Progress Notes (Signed)
Chief Complaint  Patient presents with  . Annual Exam    HPI: Patient comes in today for Preventive Health Care visit   Rosacea;  Topical sees derm   Changed  To white wine.  Some helps. Sees dermatologist CAD Has seen dr Orlin Hilding  Yearly check No unusual bruising or bleeding he feels that his mood is pretty good. Tolerating his cardiac medicines well he is now off the testosterone and doesn't feel he different actually feels better.  ROS:  GEN/ HEENT: No fever, significant weight changes sweats headaches vision problems hearing changes, CV/ PULM; No chest pain shortness of breath cough, syncope,edema  change in exercise tolerance. GI /GU: No adominal pain, vomiting, change in bowel habits. No blood in the stool. No significant GU symptoms. SKIN/HEME: ,no acute skin rashes suspicious lesions or bleeding. No lymphadenopathy, nodules, masses.  NEURO/ PSYCH:  No neurologic signs such as weakness numbness. No depression anxiety. IMM/ Allergy: No unusual infections.  Allergy .   REST of 12 system review negative except as per HPI Wife has recovered from her back surgery predicament at [facility] and she is doing so much better.  Past Medical History  Diagnosis Date  . Hyperlipidemia      Hx of low HDL zero CAC in 2000 at [facility]  . STEMI (ST elevation myocardial infarction)     left circumflex DE stent  August 2010 ni stress test  . Alcohol addiction   . Adjustment disorder with anxiety 06/13/2009    Qualifier: Diagnosis of  By: Fabian Sharp MD, Neta Mends  related to his sudden MI underwent counseling takes low-dose medication as needed had side effects from an SSRI     Family History  Problem Relation Age of Onset  . Tongue cancer Father     tobacco  . Throat cancer Father   . Cirrhosis Father   . Hypertension Brother     History   Social History  . Marital Status: Married    Spouse Name: N/A    Number of Children: N/A  . Years of Education: N/A   Occupational History  . phd teaching  [employer]   Social History Main Topics  . Smoking status: Never Smoker   . Smokeless tobacco: None  . Alcohol Use: 1.0 oz/week    2 drink(s) per week  . Drug Use: No  . Sexual Activity: None   Other Topics Concern  . 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 aspirin 81 MG tablet Take 81 mg by mouth daily.        . Cholecalciferol (VITAMIN D3) 1000 UNIT capsule Take 1,000 Units by mouth daily.        . clonazePAM (KLONOPIN) 1 MG tablet       . Coenzyme Q10 150 MG CAPS Take by mouth.        . doxycycline (DORYX) 100 MG DR capsule Take 100 mg by mouth daily. Every other day      . metroNIDAZOLE (METROGEL) 1 % gel Apply topically daily.        . MULTIPLE VITAMIN  PO Take by mouth.        . niacin (NIASPAN) 1000 MG CR tablet Take 1 tablet (1,000 mg total) by mouth at bedtime.  40 tablet  9  . nitroGLYCERIN (NITROSTAT) 0.4 MG SL tablet Place 0.4 mg under the tongue every 5 (five) minutes as needed.        . rosuvastatin (CRESTOR) 10 MG tablet Take 1 tablet (10 mg total) by mouth daily.  30 tablet  10   No facility-administered encounter medications on file as of 05/11/2013.    EXAM:  BP 124/78  Pulse 87  Temp(Src) 98.3 F (36.8 C) (Oral)  Ht 5\' 11"  (1.803 m)  Wt 211 lb (95.709 kg)  BMI 29.44 kg/m2  SpO2 98%  Body mass index is 29.44 kg/(m^2).  Physical Exam: Vital signs reviewed [id number] is a well-developed well-nourished alert cooperative male who appears stated age in no acute distress.  HEENT: normocephalic atraumatic , Eyes: PERRL EOM's full, conjunctiva clear, Nares: paten,t no deformity discharge or tenderness., Ears: no deformity EAC's clear TMs with normal landmarks. Mouth:  clear OP, no lesions, edema.  Moist mucous membranes. Dentition in adequate repair. NECK: supple without masses, thyromegaly or bruits. CHEST/PULM:  Clear to auscultation and percussion breath sounds equal no wheeze , rales or rhonchi. No chest wall deformities or tenderness. CV: PMI is nondisplaced, S1 S2 no gallops, murmurs, rubs. Peripheral pulses are full without delay.No JVD .  ABDOMEN: Bowel sounds normal nontender  No guard or rebound, no hepato splenomegal no CVA tenderness.  No hernia. Extremtities:  No clubbing cyanosis or edema, no acute joint swelling or redness no focal atrophy NEURO:  Oriented x3, cranial nerves 3-12 appear to be intact, no obvious focal weakness,gait within normal limits no abnormal reflexes or asymmetrical SKIN: No acute rashes normal turgor, color, no bruising or petechiae except. Rosacea on face PSYCH: Oriented, good eye contact, no obvious depression anxiety, cognition and judgment appear normal. LN: no cervical axillary inguinal adenopathy Rectal prostate no masses  Nl - 1+ no nodules   Lab Results  Component Value Date   WBC 4.2* 04/27/2013   HGB 15.4 04/27/2013   HCT 44.8 04/27/2013   PLT 82.0* 04/27/2013   GLUCOSE 92 04/27/2013   CHOL 137 04/27/2013   TRIG 102.0 04/27/2013   HDL 38.00* 04/27/2013   LDLDIRECT 128.7 01/04/2009   LDLCALC 79 04/27/2013   ALT 36 04/27/2013   AST 35 04/27/2013   NA 136 04/27/2013   K 4.1 04/27/2013   CL 104 04/27/2013   CREATININE 0.9 04/27/2013   BUN 15 04/27/2013   CO2 28 04/27/2013   TSH 3.63 04/27/2013   PSA 0.42 02/08/2013   INR 1.1 05/01/2009    ASSESSMENT AND PLAN:  Discussed the following assessment and plan:  Visit for preventive health examination  CAD  Hyperlipidemia  Thrombocytopenia  Need for prophylactic vaccination and inoculation against influenza - Plan: Flu Vaccine QUAD 36+ mos PF IM (Fluarix)  Rosacea Following blood count has had hematology consult in the past no alarm features on exam today about the  same recheck CBC in 6 months. He mood seems to be much better and stable f Review health care maintenance keep up to date  Patient Care Team: Madelin Headings, MD as PCP - General Lennette Bihari, MD (Cardiology) Chucky May, MD as Consulting Physician (Ophthalmology) Elmon Else, MD as Consulting Physician (Dermatology) Griffith Citron, MD as Consulting Physician (Gastroenterology) Patient Instructions  Continue lifestyle intervention healthy eating and  exercise . Will send copy labs to dr Tresa Endo Plan repeat cbcdiff  In 6 months no visit needed.   Neta Mends. Doral Digangi M.D.   Health Maintenance  Topic Date Due  . Influenza Vaccine  04/02/2014  . Tetanus/tdap  04/19/2018  . Colonoscopy  01/13/2019  . Zostavax  Completed   Health Maintenance Review }

## 2013-05-11 NOTE — Patient Instructions (Signed)
Continue lifestyle intervention healthy eating and exercise . Will send copy labs to dr Tresa Endo Plan repeat cbcdiff  In 6 months no visit needed.

## 2013-05-17 ENCOUNTER — Telehealth: Payer: Self-pay | Admitting: Family Medicine

## 2013-05-17 NOTE — Telephone Encounter (Signed)
Patient received a letter in the mail from [facility] and they are recommending the patient have a bone density due to his low hormones.  Please advise. If you want to proceed please give dx code.  Thanks!

## 2013-08-16 ENCOUNTER — Other Ambulatory Visit: Payer: Self-pay | Admitting: Internal Medicine

## 2013-08-17 NOTE — Telephone Encounter (Signed)
Spoke to the pharmacy.  Last filled on 01/15/2013 Had his CPE on 05/11/13 and no future appt. Please advise.  Thanks!

## 2013-08-17 NOTE — Telephone Encounter (Signed)
Call in #[phone] with no rf 

## 2013-09-23 ENCOUNTER — Encounter: Payer: Self-pay | Admitting: *Deleted

## 2013-09-27 ENCOUNTER — Ambulatory Visit (INDEPENDENT_AMBULATORY_CARE_PROVIDER_SITE_OTHER): Payer: BC Managed Care – PPO | Admitting: Cardiovascular Disease

## 2013-09-27 VITALS — BP 112/80 | HR 71 | Ht 72.0 in | Wt 219.4 lb

## 2013-09-27 DIAGNOSIS — I2510 Atherosclerotic heart disease of native coronary artery without angina pectoris: Secondary | ICD-10-CM

## 2013-09-27 DIAGNOSIS — D696 Thrombocytopenia, unspecified: Secondary | ICD-10-CM

## 2013-09-27 DIAGNOSIS — E785 Hyperlipidemia, unspecified: Secondary | ICD-10-CM

## 2013-09-27 MED ORDER — ROSUVASTATIN CALCIUM 20 MG PO TABS
20.0000 mg | ORAL_TABLET | Freq: Every day | ORAL | Status: DC
Start: 2013-09-27 — End: 2014-10-03

## 2013-09-27 NOTE — Patient Instructions (Signed)
Your physician has recommended you make the following change in your medication: stop the niaspan. Increase the crestor to 20 mg. This has already been sent to the pharmacy.  Your physician recommends that you schedule a follow-up appointment in: 6 months.

## 2013-09-28 ENCOUNTER — Encounter: Payer: Self-pay | Admitting: Cardiovascular Disease

## 2013-09-28 NOTE — Progress Notes (Signed)
Patient ID: RANON COVEN, male   DOB: 06-Feb-1951, 63 y.o.   MRN: [id number]     HPI: Jon Carter, is a 63 y.o. male who presents to the office today for six-month cardiology evaluation.  Jon Carter is 4 1/2 years status post presenting with an acute coronary syndrome/ST segment elevation myocardial infarction on 04/26/2009. At that time, I took them abruptly to the cardiac catheterization laboratory where he was found to have total occlusion of his mid circumflex coronary artery. He had an excellent DTB time of less than 40 minutes and ultimately had a Xience DES 2.75x18 mm stent inserted into the circumflex, postdilated to 3.0 mm. Subsequently, he has been demonstrated to have complete salvage of myocardium. Initially, he did have significant anxiety issues following his acute coronary syndrome. Ultimately, this has significantly improved.  He states over the past 6 months he has felt exceptional. He recently returned from several weeks in [place] around Christmas time where he walked daily without discomfort.  He is exercising regularly. He denies any recurrent episodes of chest pain he denies tachycardia palpitations. He does does has have issues with thrombocytopenia but he denies any recent bleeding. He is unaware of any change in exercise tolerance. His last stress test was in August 2013 which continues to show normal perfusion.  He has noticed an occasional flushing sensation with Niaspan particularly if he did have a glass of wine in proximity to taking this dose.  Past Medical History  Diagnosis Date  . Hyperlipidemia     Hx of low HDL zero CAC in 2000 at [facility]  . STEMI (ST elevation myocardial infarction) 04/26/2009    left circumflex DES   . Alcohol addiction   . Adjustment disorder with anxiety 06/13/2009    Qualifier: Diagnosis of  By: Jon Bill MD, Standley Brooking  related to his sudden MI underwent counseling takes low-dose medication as needed had side effects from an SSRI   . Low  testosterone     Past Surgical History  Procedure Laterality Date  . Cataract extraction Left 2009    Jon Carter  . Coronary angioplasty with stent placement  04/26/2009    ACS/STEMI - total occlusion of mid Cfx - Xience DES 2.75x49mm (Jon Carter)  . Cardiac catheterization  05/02/2009    UA - patent Cfx stent, 50-60% LAD, 50-60% RCA with normal LV funtion (Jon Carter)  . Transthoracic echocardiogram  05/2009    EF=50-55%; trace MR; mild TR, normal RSVP; AV mildly sclerotic  . Nm myocar perf wall motion  04/2012    bruce myoview; mild perfusion defect in basal inferior region (infarct/scar or overlying attenuation); post-stress EF 53%, EKG negative for ischemia, hypertensive response to exercise; abnormal study but unchanged from previous study    Allergies  Allergen Reactions  . Escitalopram Oxalate     REACTION: flushing of the skin and sick all over ?  only took 1 pill    Current Outpatient Prescriptions  Medication Sig Dispense Refill  . aspirin 81 MG tablet Take 81 mg by mouth daily.        . Cholecalciferol (VITAMIN D3) 1000 UNIT capsule Take 1,000 Units by mouth daily.        . Coenzyme Q10 150 MG CAPS Take by mouth.        . doxycycline (DORYX) 100 MG DR capsule Take 100 mg by mouth daily. Every other day      . metroNIDAZOLE (METROGEL) 1 % gel Apply topically daily.        Marland Kitchen  MULTIPLE VITAMIN PO Take by mouth.        . nitroGLYCERIN (NITROSTAT) 0.4 MG SL tablet Place 0.4 mg under the tongue every 5 (five) minutes as needed.        . clonazePAM (KLONOPIN) 1 MG tablet 1/4 tablets every day      . rosuvastatin (CRESTOR) 20 MG tablet Take 1 tablet (20 mg total) by mouth daily.  90 tablet  3   No current facility-administered medications for this visit.    History   Social History  . Marital Status: Married    Spouse Name: N/A    Number of Children: N/A  . Years of Education: N/A   Occupational History  . phd teaching [employer]   Social History Main  Topics  . Smoking status: Never Smoker   . Smokeless tobacco: Not on file  . Alcohol Use: 1.0 oz/week    2 drink(s) per week  . Drug Use: No  . Sexual Activity: Not on file   Other Topics Concern  . 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 Jon Carter. He does travel. He exercises daily. There is no tobacco use.  ROS is negative for fevers, chills or night sweats. He denies change in vision or hearing. He denies lymphadenopathy. He denies wheezing. He denies dyspnea. No bleeding or rashes. No angina. No PND or orthopnea. No abdominal pain. He feels well. The past he has had issues with low testosterone. He denies change of bowel or bladder habits. There is no edema.  There is no diabetes. He denies issues with sleep disordered breathing Other comprehensive 14 point system review is negative.  PE BP 112/80  Pulse 71  Ht 6' (1.829 m)  Wt 219 lb 6.4 oz (99.519 kg)  BMI 29.75 kg/m2  General: Alert, oriented, no distress.  Skin: normal turgor, no rashes HEENT: Normocephalic, atraumatic. Pupils round and reactive; sclera anicteric;no lid lag. No xanthelasmas. Nose without nasal septal hypertrophy Mouth/Parynx benign; Mallinpatti scale 2 Neck: No JVD, no carotid bruits with normal carotid upstroke. Chest: Without chest or tenderness. No chest wall tenderness Lungs: clear to ausculatation and percussion; no wheezing or rales Heart: RRR, s1 s2 normal  Abdomen: soft, nontender; no hepatosplenomehaly, BS+; abdominal aorta nontender and not dilated by palpation. Back: No CVA tenderness Pulses 2+ Extremities: no clubbing cyanosis or edema, Homan's sign negative  Neurologic: grossly nonfocal Psychological: Normal affect and mood.  ECG  (independently interpreted by me): Normal sinus rhythm at 71 beats per minute. No significant ST changes. Normal intervals.  Last ECG of 03/26/2013: Normal sinus rhythm at 71 beats per minute. Normal intervals.  LABS:  BMET    Component Value Date/Time   NA 136 04/27/2013 0904   K 4.1 04/27/2013 0904   CL 104 04/27/2013 0904   CO2 28 04/27/2013 0904   GLUCOSE 92 04/27/2013 0904   BUN 15 04/27/2013 0904   CREATININE 0.9 04/27/2013 0904   CALCIUM 9.2 04/27/2013 0904   GFRNONAA 91.78 11/24/2009 0907   GFRAA  Value: >60        The eGFR has been calculated using the  MDRD equation. This calculation has not been validated in all clinical situations. eGFR's persistently <60 mL/min signify possible Chronic Kidney Disease. 05/03/2009 0507     Hepatic Function Panel     Component Value Date/Time   PROT 8.0 04/27/2013 0904   ALBUMIN 4.1 04/27/2013 0904   AST 35 04/27/2013 0904   ALT 36 04/27/2013 0904   ALKPHOS 41 04/27/2013 0904   BILITOT 0.6 04/27/2013 0904   BILIDIR 0.1 04/27/2013 0904   IBILI 0.8 05/01/2009 2155     CBC    Component Value Date/Time   WBC 4.2* 04/27/2013 0904   WBC 3.9* 07/12/2010 1244   RBC 4.94 04/27/2013 0904   RBC 5.06 07/12/2010 1244   HGB 15.4 04/27/2013 0904   HGB 15.4 07/12/2010 1244   HCT 44.8 04/27/2013 0904   HCT 45.6 07/12/2010 1244   PLT 82.0* 04/27/2013 0904   PLT 89* 07/12/2010 1244   MCV 90.7 04/27/2013 0904   MCV 90.1 07/12/2010 1244   MCH 30.4 07/12/2010 1244   MCHC 34.4 04/27/2013 0904   MCHC 33.8 07/12/2010 1244   RDW 14.0 04/27/2013 0904   RDW 13.1 07/12/2010 1244   LYMPHSABS 1.6 04/27/2013 0904   LYMPHSABS 1.5 07/12/2010 1244   MONOABS 1.2* 04/27/2013 0904   MONOABS 0.9 07/12/2010 1244   EOSABS 0.0 04/27/2013 0904   EOSABS 0.0 07/12/2010 1244   BASOSABS 0.0 04/27/2013 0904   BASOSABS 0.0 07/12/2010 1244     BNP    Component Value Date/Time   PROBNP 99.0 04/28/2009 0420    Lipid Panel     Component Value Date/Time   CHOL 137 04/27/2013 0904    TRIG 102.0 04/27/2013 0904   HDL 38.00* 04/27/2013 0904   CHOLHDL 4 04/27/2013 0904   VLDL 20.4 04/27/2013 0904   LDLCALC 79 04/27/2013 0904     RADIOLOGY: No results found.    ASSESSMENT AND PLAN: Mr. Kolbe continues to do well now almost 4 1/2 years following his acute coronary syndrome when he presented with left circumflex occlusion and underwent successful prompt early revascularization. He subsequently has had complete salvage of myocardium verified by nuclear imaging. He's not having any recurrent anginal symptoms and continues to experience excellent exercise program of exercises 5-6 days per week. He recently returned to a trip from [place] and [place] where he walked excessively. His blood pressure is controlled on current therapy. He has developed occasional flushing sensation with Niaspan. I have suggested that it may be worthwhile to consider a trial of discontinuing his niacin but further titrate his Crestor to 20 mg name to keep his LDL cholesterol in the 60s. Outcome data with LDLs in the 60s on statin therapy did not show additional mortality benefit with concomitant Niaspan preparation. He does have a history of thrombocytopenia with platelet counts in the 80s to 90s. He's not having any evidence of active bleeding. He will continue his baby aspirin. Because of his low platelet count he is not on dual antiplatelet therapy 41/2 years after his stent. I recommended he continue his exercise prescription. He will monitor his blood pressure. As long as he remains stable, I will see him in 6 months for cardiology re-evaluation.    Troy Sine, MD, [facility]  09/28/2013 1:48 PM

## 2013-10-11 ENCOUNTER — Other Ambulatory Visit: Payer: BC Managed Care – PPO

## 2013-10-27 ENCOUNTER — Other Ambulatory Visit: Payer: Self-pay | Admitting: Family Medicine

## 2013-10-27 ENCOUNTER — Other Ambulatory Visit: Payer: Self-pay | Admitting: Internal Medicine

## 2013-10-27 ENCOUNTER — Telehealth: Payer: Self-pay | Admitting: Internal Medicine

## 2013-10-27 ENCOUNTER — Other Ambulatory Visit: Payer: BC Managed Care – PPO

## 2013-10-27 ENCOUNTER — Ambulatory Visit (INDEPENDENT_AMBULATORY_CARE_PROVIDER_SITE_OTHER): Payer: BC Managed Care – PPO

## 2013-10-27 DIAGNOSIS — E785 Hyperlipidemia, unspecified: Secondary | ICD-10-CM

## 2013-10-27 DIAGNOSIS — D72819 Decreased white blood cell count, unspecified: Secondary | ICD-10-CM

## 2013-10-27 LAB — CBC WITH DIFFERENTIAL/PLATELET
HCT: 46.7 % (ref 39.0–52.0)
HEMOGLOBIN: 15.1 g/dL (ref 13.0–17.0)
MCHC: 32.3 g/dL (ref 30.0–36.0)
MCV: 93.8 fl (ref 78.0–100.0)
Platelets: 90 10*3/uL — ABNORMAL LOW (ref 150.0–400.0)
RBC: 4.98 Mil/uL (ref 4.22–5.81)
RDW: 14 % (ref 11.5–14.6)
WBC: 4.9 10*3/uL (ref 4.5–10.5)

## 2013-10-27 LAB — LIPID PANEL
CHOL/HDL RATIO: 3
CHOLESTEROL: 116 mg/dL (ref 0–200)
HDL: 36.9 mg/dL — AB (ref >39.00)
LDL Cholesterol: 60 mg/dL (ref 0–99)
TRIGLYCERIDES: 97 mg/dL (ref 0.0–149.0)
VLDL: 19.4 mg/dL (ref 0.0–40.0)

## 2013-10-27 NOTE — Telephone Encounter (Signed)
Order placed and lab notified.

## 2013-10-27 NOTE — Telephone Encounter (Signed)
[facility] called to get an order for pt to to have lipid panel done. Pt requested and they did w/out an order in computer. Now they need an order to process.

## 2013-10-28 LAB — PATHOLOGIST SMEAR REVIEW

## 2013-10-29 LAB — LIPID PANEL
Cholesterol: 118 mg/dL (ref 0–200)
HDL: 37.7 mg/dL — AB (ref >39.00)
LDL Cholesterol: 60 mg/dL (ref 0–99)
Total CHOL/HDL Ratio: 3
Triglycerides: 103 mg/dL (ref 0.0–149.0)
VLDL: 20.6 mg/dL (ref 0.0–40.0)

## 2013-12-10 ENCOUNTER — Ambulatory Visit (INDEPENDENT_AMBULATORY_CARE_PROVIDER_SITE_OTHER): Payer: BC Managed Care – PPO | Admitting: Internal Medicine

## 2013-12-10 ENCOUNTER — Encounter: Payer: Self-pay | Admitting: Internal Medicine

## 2013-12-10 VITALS — BP 128/72 | Temp 98.9°F | Ht 71.0 in | Wt 221.0 lb

## 2013-12-10 DIAGNOSIS — I2510 Atherosclerotic heart disease of native coronary artery without angina pectoris: Secondary | ICD-10-CM

## 2013-12-10 DIAGNOSIS — N5089 Other specified disorders of the male genital organs: Secondary | ICD-10-CM | POA: Insufficient documentation

## 2013-12-10 DIAGNOSIS — N508 Other specified disorders of male genital organs: Secondary | ICD-10-CM

## 2013-12-10 NOTE — Progress Notes (Signed)
Pre visit review using our clinic review tool, if applicable. No additional management support is needed unless otherwise documented below in the visit note.   Chief Complaint  Patient presents with  . Testicle Problem    Rt side.  Pt says he feels a knot on his rt testicle.  First noticed last night.    HPI: Patient comes in today for SDA for  new problem evaluation. Noted  Lump nodule non tender area on right testicle last night . hasnt felt it before but doesn't do reg checks and non tender.  Very concerned about this as his baseline anxiety   Took 1/2 clonopin and slept better .  Doing ok other wise gu .  Still exercising no cv sx.   ROS: See pertinent positives and negatives per HPI. Hx of low t levels    Past Medical History  Diagnosis Date  . Hyperlipidemia     Hx of low HDL zero CAC in 2000 at [facility]  . STEMI (ST elevation myocardial infarction) 04/26/2009    left circumflex DES   . Alcohol addiction   . Adjustment disorder with anxiety 06/13/2009    Qualifier: Diagnosis of  By: Regis Bill MD, Standley Brooking  related to his sudden MI underwent counseling takes low-dose medication as needed had side effects from an SSRI   . Low testosterone     Family History  Problem Relation Age of Onset  . Tongue cancer Father     tobacco  . Throat cancer Father   . Cirrhosis Father     also heart problems  . Hypertension Mother   . Heart Problems      History   Social History  . Marital Status: Married    Spouse Name: N/A    Number of Children: N/A  . Years of Education: N/A   Occupational History  . 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 Smoking status: Never Smoker   . Smokeless tobacco: None  . Alcohol Use: 1.0 oz/week    2 drink(s) per week  . Drug Use: No  . Sexual Activity: None   Other Topics Concern  . 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 aspirin 81 MG tablet Take 81 mg by mouth daily.    . Cholecalciferol (VITAMIN D3) 1000 UNIT capsule Take 1,000 Units by mouth daily.    . clonazePAM (KLONOPIN) 1 MG tablet 1/4 tablets every day  . Coenzyme Q10 150 MG CAPS Take by mouth.    . doxycycline (DORYX) 100 MG DR capsule Take 100 mg by mouth daily. Every other day  . metroNIDAZOLE (METROGEL) 1 % gel Apply topically daily.    . MULTIPLE VITAMIN PO Take by mouth.    . nitroGLYCERIN (NITROSTAT) 0.4 MG SL tablet Place 0.4 mg under the tongue every 5 (five) minutes as needed.    . Omega-3 Fatty Acids (FISH OIL) 1200 MG CAPS Take 1 capsule by mouth daily.  . rosuvastatin (CRESTOR) 20 MG tablet Take 1 tablet (20 mg total) by mouth daily.    EXAM:  BP  128/72  Temp(Src) 98.9 F (37.2 C) (Oral)  Ht 5\' 11"  (1.803 m)  Wt 221 lb (100.245 kg)  BMI 30.84 kg/m2  Body mass index is 30.84 kg/(m^2).  GENERAL: vitals reviewed and listed above, alert, oriented, appears well hydrated and in no acute distress gu right test area posterior? Mid?firm ?  nmpm matted  Seed shaped 2-3 mm nodule noted non tender   Uncertain location  No adenopathy or skin change or edema. MS: moves all extremities without noticeable focal  abnormality PSYCH: pleasant and cooperative, no obvious depression mod  anxiety  ASSESSMENT AND PLAN:  Discussed the following assessment and plan:  Testicular nodule vs cyst - uncertain anatomical location  small but never felt before pt very anxious get referral exam asap.  - Plan: Ambulatory referral to Urology Uncertain if testicular or epididymal  -Patient advised to return or notify health care team  if symptoms worsen ,persist or new concerns arise.  Patient Instructions  This may just be a cyst .  And may have been there a while   I advise a  urologist   To check this also to see if  Need more evaluation.  Someone will contact. you about referral.    Standley Brooking. Panosh M.D.

## 2013-12-10 NOTE — Patient Instructions (Addendum)
This may just be a cyst .  And may have been there a while   I advise a urologist   To check this also to see if  Need more evaluation.  Someone will contact. you about referral.

## 2014-04-27 ENCOUNTER — Encounter: Payer: Self-pay | Admitting: Internal Medicine

## 2014-07-07 ENCOUNTER — Encounter: Payer: Self-pay | Admitting: Gastroenterology

## 2014-10-03 ENCOUNTER — Other Ambulatory Visit: Payer: Self-pay

## 2014-10-03 MED ORDER — ROSUVASTATIN CALCIUM 20 MG PO TABS: 20.0000 mg | ORAL_TABLET | Freq: Every day | ORAL | Status: DC

## 2014-10-03 NOTE — Telephone Encounter (Signed)
Rx sent to pharmacy   

## 2014-11-04 ENCOUNTER — Other Ambulatory Visit: Payer: Self-pay | Admitting: *Deleted

## 2014-11-07 ENCOUNTER — Other Ambulatory Visit: Payer: Self-pay | Admitting: *Deleted

## 2014-11-07 MED ORDER — ROSUVASTATIN CALCIUM 20 MG PO TABS: 20.0000 mg | ORAL_TABLET | Freq: Every day | ORAL | Status: DC

## 2014-11-07 NOTE — Telephone Encounter (Signed)
ERX sent to pharmacy 

## 2014-11-22 ENCOUNTER — Other Ambulatory Visit: Payer: Self-pay | Admitting: Cardiovascular Disease

## 2014-11-22 MED ORDER — ROSUVASTATIN CALCIUM 20 MG PO TABS: 20.0000 mg | ORAL_TABLET | Freq: Every day | ORAL | Status: DC

## 2014-11-22 NOTE — Telephone Encounter (Signed)
Rx(s) sent to pharmacy electronically. Patient notified that he needs an OV with Dr. Claiborne Billings  He is out of country for 3 months starting May 13th  Will send staff message to scheduler, Dalene Seltzer, to contact patient.

## 2014-11-22 NOTE — Telephone Encounter (Signed)
°  1. Which medications need to be refilled? Crestor-pt not sure if he gets the generic,please check  2. Which pharmacy is medication to be sent to?[facility] [phone]  3. Do they need a 30 day or 90 day supply? 90 and refills  4. Would they like a call back once the medication has been sent to the pharmacy? no

## 2014-11-23 ENCOUNTER — Telehealth: Payer: Self-pay | Admitting: Cardiovascular Disease

## 2014-11-23 NOTE — Telephone Encounter (Signed)
Closed encounter °

## 2014-12-16 ENCOUNTER — Encounter: Payer: Self-pay | Admitting: Cardiovascular Disease

## 2014-12-16 ENCOUNTER — Telehealth: Payer: Self-pay | Admitting: Cardiovascular Disease

## 2014-12-16 ENCOUNTER — Ambulatory Visit (INDEPENDENT_AMBULATORY_CARE_PROVIDER_SITE_OTHER): Payer: BC Managed Care – PPO | Admitting: Cardiovascular Disease

## 2014-12-16 VITALS — BP 116/82 | HR 64 | Ht 72.0 in | Wt 227.8 lb

## 2014-12-16 DIAGNOSIS — E785 Hyperlipidemia, unspecified: Secondary | ICD-10-CM

## 2014-12-16 DIAGNOSIS — Z79899 Other long term (current) drug therapy: Secondary | ICD-10-CM

## 2014-12-16 DIAGNOSIS — I25810 Atherosclerosis of coronary artery bypass graft(s) without angina pectoris: Secondary | ICD-10-CM

## 2014-12-16 DIAGNOSIS — I2510 Atherosclerotic heart disease of native coronary artery without angina pectoris: Secondary | ICD-10-CM

## 2014-12-16 DIAGNOSIS — D696 Thrombocytopenia, unspecified: Secondary | ICD-10-CM

## 2014-12-16 NOTE — Telephone Encounter (Signed)
Jon Carter is calling because he wants to know why is he having a lexiscan myoview , wants to know why is it different from the the normal stress test he usually have .Marland Kitchen Please call    Thanks

## 2014-12-16 NOTE — Patient Instructions (Addendum)
Your physician recommends that you return for lab work fasting.  Your physician has requested that you have en exercise stress myoview and office visit in 1 year.

## 2014-12-16 NOTE — Telephone Encounter (Signed)
Spoke to patient  informed patient after review with Mariann Laster . Patient should do exercise myoview next year.  Patient aware. Mariann Laster corrected order.

## 2014-12-17 ENCOUNTER — Encounter: Payer: Self-pay | Admitting: Cardiovascular Disease

## 2014-12-17 NOTE — Progress Notes (Signed)
Patient ID: Jon Carter, male   DOB: Jun 08, 1951, 64 y.o.   MRN: [id number]     HPI: Jon Carter is a 64 y.o. male who presents to the office today for 61 -month cardiology evaluation.  Jon Carter presented with an acute coronary syndrome/ST segment elevation myocardial infarction on 04/26/2009. I took him acutely to the cardiac catheterization laboratory where he was found to have total occlusion of his mid circumflex coronary artery. He had an excellent DTB time of less than 40 minutes and ultimately had a Xience DES 2.75x18 mm stent inserted into the circumflex, postdilated to 3.0 mm. Subsequently, he has been demonstrated to have complete salvage of myocardium. Initially, he did have significant anxiety issues following his acute coronary syndrome. Ultimately, this has significantly improved.  He works as an Air traffic controller.  Typically he is off the summer.  For the last several years.  He has been traveling extensively to both [place] as well as [place] and [place].  He remains active.  He denies any episodes of chest pain.  He's exercising regularly at the gym.  He denies PND, orthopnea.  He is resolved.  His initial emotional issues following his ACS presentation.  His last stress test was in August 2013 continued to show normal perfusion.  In the past, he had some issues with mild thrombocytopenia.  He has been maintained on Crestor 20 mg daily.  He is on aspirin.  He's no longer taking any beta blocker or ACE inhibitor.  Past Medical History  Diagnosis Date  . Hyperlipidemia     Hx of low HDL zero CAC in 2000 at [place]  . STEMI (ST elevation myocardial infarction) 04/26/2009    left circumflex DES   . Alcohol addiction   . Adjustment disorder with anxiety 06/13/2009    Qualifier: Diagnosis of  By: Regis Bill MD, Standley Brooking  related to his sudden MI underwent counseling takes low-dose medication as needed had side effects from an SSRI   . Low testosterone     Past Surgical  History  Procedure Laterality Date  . Cataract extraction Left 2009    [place]  . Coronary angioplasty with stent placement  04/26/2009    ACS/STEMI - total occlusion of mid Cfx - Xience DES 2.75x52mm (Dr. Corky Downs)  . Cardiac catheterization  05/02/2009    UA - patent Cfx stent, 50-60% LAD, 50-60% RCA with normal LV funtion (Dr. Adora Fridge)  . Transthoracic echocardiogram  05/2009    EF=50-55%; trace MR; mild TR, normal RSVP; AV mildly sclerotic  . Nm myocar perf wall motion  04/2012    bruce myoview; mild perfusion defect in basal inferior region (infarct/scar or overlying attenuation); post-stress EF 53%, EKG negative for ischemia, hypertensive response to exercise; abnormal study but unchanged from previous study    Allergies  Allergen Reactions  . Escitalopram Oxalate     REACTION: flushing of the skin and sick all over ?  only took 1 pill    Current Outpatient Prescriptions  Medication Sig Dispense Refill  . aspirin 81 MG tablet Take 81 mg by mouth daily.      . clonazePAM (KLONOPIN) 1 MG tablet 1/4 tablets every day    . Coenzyme Q10 150 MG CAPS Take by mouth.      . doxycycline (DORYX) 100 MG DR capsule Take 100 mg by mouth daily. Every other day    . metroNIDAZOLE (METROGEL) 1 % gel Apply topically daily.      Marland Kitchen  nitroGLYCERIN (NITROSTAT) 0.4 MG SL tablet Place 0.4 mg under the tongue every 5 (five) minutes as needed.      . rosuvastatin (CRESTOR) 20 MG tablet Take 1 tablet (20 mg total) by mouth daily. 90 tablet 0   No current facility-administered medications for this visit.    History   Social History  . Marital Status: Married    Spouse Name: N/A  . Number of Children: N/A  . Years of Education: N/A   Occupational History  . 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 Smoking status: Never Smoker   . Smokeless tobacco: Not on file  . Alcohol Use: 1.0 oz/week    2 drink(s) per week  . Drug Use: No  . Sexual Activity: Not on file    Other Topics Concern  . Not on file   Social History Narrative   Designated Party Release signed on 02/05/2010     [phone]    married works at [employer] retired Therapist, art reserve PhD Surveyor, quantity   never smoked regular exercise household  3    no pets               Socially he is married to my patient Ms. [employer].  He is retired from [employer] after being in [employer] for 30 years.  He works as an Air traffic controller for 9 months.  He he has the summer off and typically travels abroad and remains active.  He exercises daily. There is no tobacco use.  ROS General: Negative; No fevers, chills, or night sweats;  HEENT: Negative; No changes in vision or hearing, sinus congestion, difficulty swallowing Pulmonary: Negative; No cough, wheezing, shortness of breath, hemoptysis Cardiovascular: Negative; No chest pain, presyncope, syncope, palpitations GI: Negative; No nausea, vomiting, diarrhea, or abdominal pain GU: Negative; No dysuria, hematuria, or difficulty voiding Musculoskeletal: Negative; no myalgias, joint pain, or weakness Hematologic/Oncology: Negative; no easy bruising, bleeding Endocrine: Negative; no heat/cold intolerance; no diabetes Neuro: Negative; no changes in balance, headaches Skin: Negative; No rashes or skin lesions Psychiatric: Negative; No behavioral problems, depression Sleep: Negative; No snoring, daytime sleepiness, hypersomnolence, bruxism, restless legs, hypnogognic hallucinations, no cataplexy Other comprehensive 14 point system review is negative.   PE BP 116/82 mmHg  Pulse 64  Ht 6' (1.829 m)  Wt 227 lb 12.8 oz (103.329 kg)  BMI 30.89 kg/m2   Wt Readings from Last 3 Encounters:  12/16/14 227 lb 12.8 oz (103.329 kg)  12/10/13 221 lb (100.245 kg)  09/27/13 219 lb 6.4 oz (99.519 kg)   General: Alert, oriented, no distress.  Skin: normal turgor, no rashes HEENT: Normocephalic, atraumatic. Pupils round and reactive; sclera anicteric;no lid lag.  No xanthelasmas. Nose without nasal septal hypertrophy Mouth/Parynx benign; Mallinpatti scale 2 Neck: No JVD, no carotid bruits with normal carotid upstroke. Chest: Without chest or tenderness. No chest wall tenderness Lungs: clear to ausculatation and percussion; no wheezing or rales Heart: RRR, s1 s2 normal; no murmur, rubs, thrills or heaves. Abdomen: soft, nontender; no hepatosplenomehaly, BS+; abdominal aorta nontender and not dilated by palpation. Back: No CVA tenderness Pulses 2+ Extremities: no clubbing cyanosis or edema, Homan's sign negative  Neurologic: grossly nonfocal Psychological: Normal affect and mood.  ECG (independently read by me): Normal sinus rhythm at 64.  No ectopy.  Normal intervals.  January 2015 ECG (independently interpreted by me): Normal sinus rhythm at 71 beats per minute. No significant ST changes. Normal intervals.  Last  ECG of 03/26/2013: Normal sinus rhythm at 71 beats per minute. Normal intervals.  LABS:  BMP Latest Ref Rng 04/27/2013 04/22/2012 05/07/2011  Glucose 70 - 99 mg/dL 92 90 97  BUN 6 - 23 mg/dL $Remove'15 12 16  'XldXanT$ Creatinine 0.4 - 1.5 mg/dL 0.9 0.9 1.1  Sodium 135 - 145 mEq/L 136 138 138  Potassium 3.5 - 5.1 mEq/L 4.1 4.2 4.3  Chloride 96 - 112 mEq/L 104 104 105  CO2 19 - 32 mEq/L $Remove'28 29 28  'ChRZGtO$ Calcium 8.4 - 10.5 mg/dL 9.2 9.3 9.0    Hepatic Function Latest Ref Rng 04/27/2013 07/31/2012 04/22/2012  Total Protein 6.0 - 8.3 g/dL 8.0 8.6(H) 7.4  Albumin 3.5 - 5.2 g/dL 4.1 4.4 4.1  AST 0 - 37 U/L 35 47(H) 45(H)  ALT 0 - 53 U/L 36 55(H) 45  Alk Phosphatase 39 - 117 U/L 41 43 37(L)  Total Bilirubin 0.3 - 1.2 mg/dL 0.6 0.8 1.1  Bilirubin, Direct 0.0 - 0.3 mg/dL 0.1 0.2 0.2    CBC Latest Ref Rng 10/27/2013 04/27/2013 02/08/2013  WBC 4.5 - 10.5 K/uL 4.9 4.2(L) -  Hemoglobin 13.0 - 17.0 g/dL 15.1 15.4 -  Hematocrit 39.0 - 52.0 % 46.7 44.8 -  Platelets 150.0 - 400.0 K/uL 90.0(L) 82.0(L) 98(L)   Lab Results  Component Value Date   TSH 3.63 04/27/2013     BNP    Component Value Date/Time   PROBNP 99.0 04/28/2009 0420    Lipid Panel     Component Value Date/Time   CHOL 116 10/27/2013 1645   CHOL 118 10/27/2013 1645   TRIG 97.0 10/27/2013 1645   TRIG 103.0 10/27/2013 1645   TRIG 347* 07/17/2006 1010   HDL 36.90* 10/27/2013 1645   HDL 37.70* 10/27/2013 1645   CHOLHDL 3 10/27/2013 1645   CHOLHDL 3 10/27/2013 1645   CHOLHDL 6.9 CALC 07/17/2006 1010   VLDL 19.4 10/27/2013 1645   VLDL 20.6 10/27/2013 1645   LDLCALC 60 10/27/2013 1645   LDLCALC 60 10/27/2013 1645    RADIOLOGY: No results found.    ASSESSMENT AND PLAN: Jon Carter is a 64 year old white male who continues to do well now almost 6 years following his acute coronary syndrome when he presented with left circumflex occlusion and underwent successful  early revascularization. He  has had complete salvage of myocardium verified by nuclear imaging.  Presently, he is not having any recurrent anginal symptoms and continues to experience excellent exercise program of exercises 5-6 days per week.  He has traveled to [place] as well as to [place] and [place] where he is hiked vigorously without recurrent symptomatology.  Remotely he had been on Niaspan in addition to Crestor.  When his lipids were last checked his LDL was excellent.  His triglycerides had markedly improved with therapy.  I'm recommending complete set of laboratory be obtained in the fasting state for further evaluation.  His last nuclear perfusion study was in 2013.  His blood pressure today is stable.  He is taking aspirin 81 mg for antiplatelet benefit.  He does have mild  thrombocytopenia but denies any bleeding.  In one year, I am recommending a four-year follow-up nuclear perfusion stress test and I will see him in the office in follow-up and further recommendations were made at that time.  Time spent: 25 minutes  Troy Sine, MD, [facility]  12/17/2014 6:47 PM

## 2015-01-03 ENCOUNTER — Other Ambulatory Visit: Payer: Self-pay | Admitting: Cardiovascular Disease

## 2015-01-03 MED ORDER — ROSUVASTATIN CALCIUM 20 MG PO TABS: 20.0000 mg | ORAL_TABLET | Freq: Every day | ORAL | Status: DC

## 2015-01-03 NOTE — Telephone Encounter (Signed)
°  1. Which medications need to be refilled? Crestor   2. Which pharmacy is medication to be sent to?[facility] on [address]  3. Do they need a 30 day or 90 day supply? 90  4. Would they like a call back once the medication has been sent to the pharmacy? Yes because he will be out of the country for 3 months

## 2015-01-03 NOTE — Telephone Encounter (Signed)
Rx refill sent to patient pharmacy  [facility] notifying patient

## 2015-04-01 LAB — LIPID PANEL
Cholesterol: 96 mg/dL — ABNORMAL LOW (ref 125–200)
HDL: 30 mg/dL — ABNORMAL LOW (ref >=40)
LDL CALC: 48 mg/dL (ref <130)
TRIGLYCERIDES: 90 mg/dL (ref <150)
Total CHOL/HDL Ratio: 3.2 Ratio (ref <=5.0)
VLDL: 18 mg/dL (ref <30)

## 2015-04-01 LAB — COMPREHENSIVE METABOLIC PANEL
ALBUMIN: 4.3 g/dL (ref 3.6–5.1)
ALT: 38 U/L (ref 9–46)
AST: 31 U/L (ref 10–35)
Alkaline Phosphatase: 37 U/L — ABNORMAL LOW (ref 40–115)
BILIRUBIN TOTAL: 0.7 mg/dL (ref 0.2–1.2)
BUN: 13 mg/dL (ref 7–25)
CO2: 27 mmol/L (ref 20–31)
Calcium: 9.6 mg/dL (ref 8.6–10.3)
Chloride: 104 mmol/L (ref 98–110)
Creat: 0.85 mg/dL (ref 0.70–1.25)
Glucose, Bld: 81 mg/dL (ref 65–99)
POTASSIUM: 4.1 mmol/L (ref 3.5–5.3)
Sodium: 140 mmol/L (ref 135–146)
TOTAL PROTEIN: 8 g/dL (ref 6.1–8.1)

## 2015-04-01 LAB — CBC
HEMATOCRIT: 47.4 % (ref 39.0–52.0)
Hemoglobin: 15.7 g/dL (ref 13.0–17.0)
MCH: 29.6 pg (ref 26.0–34.0)
MCHC: 33.1 g/dL (ref 30.0–36.0)
MCV: 89.3 fL (ref 78.0–100.0)
Platelets: 96 10*3/uL — ABNORMAL LOW (ref 150–400)
RBC: 5.31 MIL/uL (ref 4.22–5.81)
RDW: 13.9 % (ref 11.5–15.5)
WBC: 4.7 10*3/uL (ref 4.0–10.5)

## 2015-04-01 LAB — TSH: TSH: 1.887 u[IU]/mL (ref 0.350–4.500)

## 2015-04-04 ENCOUNTER — Encounter: Payer: Self-pay | Admitting: *Deleted

## 2015-09-22 ENCOUNTER — Telehealth: Payer: Self-pay | Admitting: Internal Medicine

## 2015-09-22 NOTE — Telephone Encounter (Signed)
Pt last cpx was in 2014. Pt would like to be work in for a cpx asap. Can I create 30 min slot in feb 2017

## 2015-09-26 NOTE — Telephone Encounter (Signed)
Okay to work in

## 2015-09-29 ENCOUNTER — Telehealth: Payer: Self-pay | Admitting: Internal Medicine

## 2015-09-29 NOTE — Telephone Encounter (Signed)
lmom for pt to call back

## 2015-09-29 NOTE — Telephone Encounter (Signed)
Pt has been sch

## 2015-09-29 NOTE — Telephone Encounter (Signed)
Pt states he hasn't had a physical in 2 years.  He was very adamant that he has his physical now, however the first physical in in July.  He will not be here for the summer.  He says he wants you to call him to discuss this.

## 2015-10-02 NOTE — Telephone Encounter (Signed)
Pt is scheduled for 10/10/15

## 2015-10-03 ENCOUNTER — Other Ambulatory Visit: Payer: BC Managed Care – PPO

## 2015-10-03 ENCOUNTER — Other Ambulatory Visit (INDEPENDENT_AMBULATORY_CARE_PROVIDER_SITE_OTHER): Payer: BC Managed Care – PPO

## 2015-10-03 DIAGNOSIS — Z0000 Encounter for general adult medical examination without abnormal findings: Secondary | ICD-10-CM

## 2015-10-03 LAB — CBC WITH DIFFERENTIAL/PLATELET
BASOS PCT: 0.1 % (ref 0.0–3.0)
Basophils Absolute: 0 10*3/uL (ref 0.0–0.1)
EOS PCT: 0.8 % (ref 0.0–5.0)
Eosinophils Absolute: 0 10*3/uL (ref 0.0–0.7)
HCT: 45.4 % (ref 39.0–52.0)
Hemoglobin: 14.8 g/dL (ref 13.0–17.0)
LYMPHS PCT: 35.6 % (ref 12.0–46.0)
Lymphs Abs: 1.6 10*3/uL (ref 0.7–4.0)
MCHC: 32.7 g/dL (ref 30.0–36.0)
MCV: 90.5 fl (ref 78.0–100.0)
Monocytes Absolute: 1.6 10*3/uL — ABNORMAL HIGH (ref 0.1–1.0)
Monocytes Relative: 35.1 % — ABNORMAL HIGH (ref 3.0–12.0)
Neutro Abs: 1.3 10*3/uL — ABNORMAL LOW (ref 1.4–7.7)
Neutrophils Relative %: 28.4 % — ABNORMAL LOW (ref 43.0–77.0)
PLATELETS: 114 10*3/uL — AB (ref 150.0–400.0)
RBC: 5.01 Mil/uL (ref 4.22–5.81)
RDW: 14.6 % (ref 11.5–15.5)
WBC: 4.5 10*3/uL (ref 4.0–10.5)

## 2015-10-03 LAB — HEPATIC FUNCTION PANEL
ALT: 58 U/L — ABNORMAL HIGH (ref 0–53)
AST: 41 U/L — ABNORMAL HIGH (ref 0–37)
Albumin: 4.4 g/dL (ref 3.5–5.2)
Alkaline Phosphatase: 36 U/L — ABNORMAL LOW (ref 39–117)
BILIRUBIN DIRECT: 0.2 mg/dL (ref 0.0–0.3)
BILIRUBIN TOTAL: 0.7 mg/dL (ref 0.2–1.2)
Total Protein: 7.8 g/dL (ref 6.0–8.3)

## 2015-10-03 LAB — BASIC METABOLIC PANEL
BUN: 13 mg/dL (ref 6–23)
CHLORIDE: 104 meq/L (ref 96–112)
CO2: 32 mEq/L (ref 19–32)
Calcium: 9.4 mg/dL (ref 8.4–10.5)
Creatinine, Ser: 0.79 mg/dL (ref 0.40–1.50)
GFR: 104.65 mL/min (ref >60.00)
Glucose, Bld: 93 mg/dL (ref 70–99)
POTASSIUM: 4.4 meq/L (ref 3.5–5.1)
Sodium: 141 mEq/L (ref 135–145)

## 2015-10-03 LAB — LIPID PANEL
CHOL/HDL RATIO: 3
Cholesterol: 99 mg/dL (ref 0–200)
HDL: 30.5 mg/dL — AB (ref >39.00)
LDL Cholesterol: 45 mg/dL (ref 0–99)
NONHDL: 68.08
TRIGLYCERIDES: 116 mg/dL (ref 0.0–149.0)
VLDL: 23.2 mg/dL (ref 0.0–40.0)

## 2015-10-03 LAB — PSA: PSA: 0.33 ng/mL (ref 0.10–4.00)

## 2015-10-03 LAB — TSH: TSH: 4.18 u[IU]/mL (ref 0.35–4.50)

## 2015-10-10 ENCOUNTER — Ambulatory Visit (INDEPENDENT_AMBULATORY_CARE_PROVIDER_SITE_OTHER): Payer: BC Managed Care – PPO | Admitting: Internal Medicine

## 2015-10-10 ENCOUNTER — Encounter: Payer: Self-pay | Admitting: Internal Medicine

## 2015-10-10 VITALS — BP 134/78 | Temp 98.7°F | Ht 71.0 in | Wt 228.2 lb

## 2015-10-10 DIAGNOSIS — D696 Thrombocytopenia, unspecified: Secondary | ICD-10-CM | POA: Diagnosis not present

## 2015-10-10 DIAGNOSIS — E785 Hyperlipidemia, unspecified: Secondary | ICD-10-CM

## 2015-10-10 DIAGNOSIS — R7989 Other specified abnormal findings of blood chemistry: Secondary | ICD-10-CM | POA: Diagnosis not present

## 2015-10-10 DIAGNOSIS — R945 Abnormal results of liver function studies: Secondary | ICD-10-CM

## 2015-10-10 DIAGNOSIS — Z0000 Encounter for general adult medical examination without abnormal findings: Secondary | ICD-10-CM | POA: Diagnosis not present

## 2015-10-10 DIAGNOSIS — Z79899 Other long term (current) drug therapy: Secondary | ICD-10-CM

## 2015-10-10 NOTE — Patient Instructions (Addendum)
Glad you are doing well.  Avoid alcohol  advil aleve tylenol . For a month  Recheck liver  panel ( no ov needed ) if ok then plan recheck. Yearly or as advised    Health Maintenance, Male A healthy lifestyle and preventative care can promote health and wellness.  Maintain regular health, dental, and eye exams.  Eat a healthy diet. Foods like vegetables, fruits, whole grains, low-fat dairy products, and lean protein foods contain the nutrients you need and are low in calories. Decrease your intake of foods high in solid fats, added sugars, and salt. Get information about a proper diet from your health care provider, if necessary.  Regular physical exercise is one of the most important things you can do for your health. Most adults should get at least 150 minutes of moderate-intensity exercise (any activity that increases your heart rate and causes you to sweat) each week. In addition, most adults need muscle-strengthening exercises on 2 or more days a week.   Maintain a healthy weight. The body mass index (BMI) is a screening tool to identify possible weight problems. It provides an estimate of body fat based on height and weight. Your health care provider can find your BMI and can help you achieve or maintain a healthy weight. For males 20 years and older:  A BMI below 18.5 is considered underweight.  A BMI of 18.5 to 24.9 is normal.  A BMI of 25 to 29.9 is considered overweight.  A BMI of 30 and above is considered obese.  Maintain normal blood lipids and cholesterol by exercising and minimizing your intake of saturated fat. Eat a balanced diet with plenty of fruits and vegetables. Blood tests for lipids and cholesterol should begin at age 72 and be repeated every 5 years. If your lipid or cholesterol levels are high, you are over age 38, or you are at high risk for heart disease, you may need your cholesterol levels checked more frequently.Ongoing high lipid and cholesterol levels should  be treated with medicines if diet and exercise are not working.  If you smoke, find out from your health care provider how to quit. If you do not use tobacco, do not start.  Lung cancer screening is recommended for adults aged 8-80 years who are at high risk for developing lung cancer because of a history of smoking. A yearly low-dose CT scan of the lungs is recommended for people who have at least a 30-pack-year history of smoking and are current smokers or have quit within the past 15 years. A pack year of smoking is smoking an average of 1 pack of cigarettes a day for 1 year (for example, a 30-pack-year history of smoking could mean smoking 1 pack a day for 30 years or 2 packs a day for 15 years). Yearly screening should continue until the smoker has stopped smoking for at least 15 years. Yearly screening should be stopped for people who develop a health problem that would prevent them from having lung cancer treatment.  If you choose to drink alcohol, do not have more than 2 drinks per day. One drink is considered to be 12 oz (360 mL) of beer, 5 oz (150 mL) of wine, or 1.5 oz (45 mL) of liquor.  Avoid the use of street drugs. Do not share needles with anyone. Ask for help if you need support or instructions about stopping the use of drugs.  High blood pressure causes heart disease and increases the risk of stroke. High  blood pressure is more likely to develop in:  People who have blood pressure in the end of the normal range (100-139/85-89 mm Hg).  People who are overweight or obese.  People who are African American.  If you are 42-48 years of age, have your blood pressure checked every 3-5 years. If you are 18 years of age or older, have your blood pressure checked every year. You should have your blood pressure measured twice--once when you are at a hospital or clinic, and once when you are not at a hospital or clinic. Record the average of the two measurements. To check your blood pressure  when you are not at a hospital or clinic, you can use:  An automated blood pressure machine at a pharmacy.  A home blood pressure monitor.  If you are 73-24 years old, ask your health care provider if you should take aspirin to prevent heart disease.  Diabetes screening involves taking a blood sample to check your fasting blood sugar level. This should be done once every 3 years after age 34 if you are at a normal weight and without risk factors for diabetes. Testing should be considered at a younger age or be carried out more frequently if you are overweight and have at least 1 risk factor for diabetes.  Colorectal cancer can be detected and often prevented. Most routine colorectal cancer screening begins at the age of 11 and continues through age 101. However, your health care provider may recommend screening at an earlier age if you have risk factors for colon cancer. On a yearly basis, your health care provider may provide home test kits to check for hidden blood in the stool. A small camera at the end of a tube may be used to directly examine the colon (sigmoidoscopy or colonoscopy) to detect the earliest forms of colorectal cancer. Talk to your health care provider about this at age 23 when routine screening begins. A direct exam of the colon should be repeated every 5-10 years through age 31, unless early forms of precancerous polyps or small growths are found.  People who are at an increased risk for hepatitis B should be screened for this virus. You are considered at high risk for hepatitis B if:  You were born in a country where hepatitis B occurs often. Talk with your health care provider about which countries are considered high risk.  Your parents were born in a high-risk country and you have not received a shot to protect against hepatitis B (hepatitis B vaccine).  You have HIV or AIDS.  You use needles to inject street drugs.  You live with, or have sex with, someone who has  hepatitis B.  You are a man who has sex with other men (MSM).  You get hemodialysis treatment.  You take certain medicines for conditions like cancer, organ transplantation, and autoimmune conditions.  Hepatitis C blood testing is recommended for all people born from [birth year] through 1965 and any individual with known risk factors for hepatitis C.  Healthy men should no longer receive prostate-specific antigen (PSA) blood tests as part of routine cancer screening. Talk to your health care provider about prostate cancer screening.  Testicular cancer screening is not recommended for adolescents or adult males who have no symptoms. Screening includes self-exam, a health care provider exam, and other screening tests. Consult with your health care provider about any symptoms you have or any concerns you have about testicular cancer.  Practice safe sex. Use condoms and  avoid high-risk sexual practices to reduce the spread of sexually transmitted infections (STIs).  You should be screened for STIs, including gonorrhea and chlamydia if:  You are sexually active and are younger than 24 years.  You are older than 24 years, and your health care provider tells you that you are at risk for this type of infection.  Your sexual activity has changed since you were last screened, and you are at an increased risk for chlamydia or gonorrhea. Ask your health care provider if you are at risk.  If you are at risk of being infected with HIV, it is recommended that you take a prescription medicine daily to prevent HIV infection. This is called pre-exposure prophylaxis (PrEP). You are considered at risk if:  You are a man who has sex with other men (MSM).  You are a heterosexual man who is sexually active with multiple partners.  You take drugs by injection.  You are sexually active with a partner who has HIV.  Talk with your health care provider about whether you are at high risk of being infected with HIV. If  you choose to begin PrEP, you should first be tested for HIV. You should then be tested every 3 months for as long as you are taking PrEP.  Use sunscreen. Apply sunscreen liberally and repeatedly throughout the day. You should seek shade when your shadow is shorter than you. Protect yourself by wearing long sleeves, pants, a wide-brimmed hat, and sunglasses year round whenever you are outdoors.  Tell your health care provider of new moles or changes in moles, especially if there is a change in shape or color. Also, tell your health care provider if a mole is larger than the size of a pencil eraser.  A one-time screening for abdominal aortic aneurysm (AAA) and surgical repair of large AAAs by ultrasound is recommended for men aged 107-75 years who are current or former smokers.  Stay current with your vaccines (immunizations).   This information is not intended to replace advice given to you by your health care provider. Make sure you discuss any questions you have with your health care provider.   Document Released: 02/15/2008 Document Revised: 09/09/2014 Document Reviewed: 01/14/2011 Elsevier Interactive Patient Education [employer].

## 2015-10-10 NOTE — Assessment & Plan Note (Signed)
Stable  89 today no anemia  follow

## 2015-10-10 NOTE — Progress Notes (Signed)
Pre visit review using our clinic review tool, if applicable. No additional management support is needed unless otherwise documented below in the visit note.  Chief Complaint  Patient presents with  . Annual Exam    HPI: Patient  Jon Carter  65 y.o. comes in today for [place] visit  Feels well  Travel recently toh 2-3 per night   Had yearly check dr Claiborne Billings Colonoscopy hemorrhoids  Dr Earlean Shawl . Dr Delman Cheadle skin    Health Maintenance  Topic Date Due  . HIV Screening  10/09/2016 (Originally 10/31/1965)  . INFLUENZA VACCINE  04/02/2016  . TETANUS/TDAP  04/19/2018  . COLONOSCOPY  04/14/2024  . ZOSTAVAX  Completed  . Hepatitis C Screening  Completed   Health Maintenance Review LIFESTYLE:  Exercise:   Every day .  Work out gym Tobacco/ETS: no Alcohol:  When travel  2-3  Sugar beverages:  no Sleep: 7.5  Drug use: no  ROS:  GEN/ HEENT: No fever, significant weight changes sweats headaches vision problems hearing changes, CV/ PULM; No chest pain shortness of breath cough, syncope,edema  change in exercise tolerance. GI /GU: No adominal pain, vomiting, change in bowel habits. No blood in the stool. No significant GU symptoms. SKIN/HEME: ,no acute skin rashes suspicious lesions or bleeding. No lymphadenopathy, nodules, masses.  NEURO/ PSYCH:  No neurologic signs such as weakness numbness. No depression anxiety. IMM/ Allergy: No unusual infections.  Allergy .   REST of 12 system review negative except as per HPI   Past Medical History  Diagnosis Date  . Hyperlipidemia     Hx of low HDL zero CAC in 2000 at [place]  . STEMI (ST elevation myocardial infarction) ([place]) 04/26/2009    left circumflex DES   . Alcohol addiction ([place])   . Adjustment disorder with anxiety 06/13/2009    Qualifier: Diagnosis of  By: Regis Bill MD, Standley Brooking  related to his sudden MI underwent counseling takes low-dose medication as needed had side effects from an SSRI   . Low testosterone     Past  Surgical History  Procedure Laterality Date  . Cataract extraction Left 2009    [place]  . Coronary angioplasty with stent placement  04/26/2009    ACS/STEMI - total occlusion of mid Cfx - Xience DES 2.75x72mm (Dr. Corky Downs)  . Cardiac catheterization  05/02/2009    UA - patent Cfx stent, 50-60% LAD, 50-60% RCA with normal LV funtion (Dr. Adora Fridge)  . Transthoracic echocardiogram  05/2009    EF=50-55%; trace MR; mild TR, normal RSVP; AV mildly sclerotic  . Nm myocar perf wall motion  04/2012    bruce myoview; mild perfusion defect in basal inferior region (infarct/scar or overlying attenuation); post-stress EF 53%, EKG negative for ischemia, hypertensive response to exercise; abnormal study but unchanged from previous study    Family History  Problem Relation Age of Onset  . Tongue cancer Father     tobacco  . Throat cancer Father   . Cirrhosis Father     also heart problems from etoh   . Hypertension Mother   . Heart Problems      mom  in 55s     Social History   Social History  . Marital Status: Married    Spouse Name: N/A  . Number of Children: N/A  . Years of Education: N/A   Occupational History  . 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 Smoking status: Never  Smoker   . Smokeless tobacco: None  . Alcohol Use: 1.0 oz/week    2 drink(s) per week  . Drug Use: No  . Sexual Activity: Not Asked   Other Topics Concern  . 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 aspirin 81 MG tablet Take 81 mg by mouth daily.      . Coenzyme Q10 150 MG CAPS Take by mouth.      . doxycycline (DORYX) 100 MG DR capsule Take 100 mg by mouth daily.     . nitroGLYCERIN (NITROSTAT) 0.4 MG  SL tablet Place 0.4 mg under the tongue every 5 (five) minutes as needed.      . rosuvastatin (CRESTOR) 20 MG tablet Take 1 tablet (20 mg total) by mouth daily. 90 tablet 3  . clonazePAM (KLONOPIN) 1 MG tablet 1/4 tablets every day    . metroNIDAZOLE (METROGEL) 1 % gel Apply topically daily.       No facility-administered medications prior to visit.     EXAM:  BP 134/78 mmHg  Temp(Src) 98.7 F (37.1 C) (Oral)  Ht 5\' 11"  (1.803 m)  Wt 228 lb 3.7 oz (103.524 kg)  BMI 31.85 kg/m2  Body mass index is 31.85 kg/(m^2).  Physical Exam: Vital signs reviewed [id number] is a well-developed well-nourished alert cooperative    who appearsr stated age in no acute distress.  HEENT: normocephalic atraumatic , Eyes: PERRL EOM's full, conjunctiva clear, Nares: paten,t no deformity discharge or tenderness., Ears: no deformity EAC's clear TMs with normal landmarks. Mouth: clear OP, no lesions, edema.  Moist mucous membranes. Dentition in adequate repair. NECK: supple without masses, thyromegaly or bruits. CHEST/PULM:  Clear to auscultation and percussion breath sounds equal no wheeze , rales or rhonchi. No chest wall deformities or tenderness. CV: PMI is nondisplaced, S1 S2 no gallops, murmurs, rubs. Peripheral pulses are full without delay.No JVD .  ABDOMEN: Bowel sounds normal nontender  No guard or rebound, no hepato splenomegal no CVA tenderness.  Rectal no masses prostate 1+ no nodules  Extremtities:  No clubbing cyanosis or edema, no acute joint swelling or redness no focal atrophy NEURO:  Oriented x3, cranial nerves 3-12 appear to be intact, no obvious focal weakness,gait within normal limits no abnormal reflexes or asymmetrical SKIN: No acute rashes normal turgor, color, no bruising or petechiae. PSYCH: Oriented, good eye contact, no obvious depression anxiety, cognition and judgment appear normal. LN: no cervical axillary inguinal adenopathy  Lab Results  Component Value Date   WBC 4.5  10/03/2015   HGB 14.8 10/03/2015   HCT 45.4 10/03/2015   PLT 114.0* 10/03/2015   GLUCOSE 93 10/03/2015   CHOL 99 10/03/2015   TRIG 116.0 10/03/2015   HDL 30.50* 10/03/2015   LDLDIRECT 128.7 01/04/2009   LDLCALC 45 10/03/2015   ALT 58* 10/03/2015   AST 41* 10/03/2015   NA 141 10/03/2015   K 4.4 10/03/2015   CL 104 10/03/2015   CREATININE 0.79 10/03/2015   BUN 13 10/03/2015   CO2 32 10/03/2015   TSH 4.18 10/03/2015   PSA 0.33 10/03/2015   INR 1.1 05/01/2009  ASSESSMENT AND PLAN:  Discussed the following assessment and plan:  Visit for preventive health examination  Medication management  HLD (hyperlipidemia)  Abnormal LFTs - minor hx of neg  hep c screen in past  last [name] abd nl [employer] wine travel etc well plan lab 1 month then as needed   Thrombocytopenia ([place]) - stable  relative neurtopenia eval  hem in past  no anemia follow  Plan lfts  Fu 1 month  Ferritin  Patient Care Team: Burnis Medin, MD as PCP - General Troy Sine, MD (Cardiology) Calvert Cantor, MD as Consulting Physician (Ophthalmology) Jari Pigg, MD as Consulting Physician (Dermatology) Richmond Campbell, MD as Consulting Physician (Gastroenterology) Patient Instructions  Glad you are doing well.  Avoid alcohol  advil aleve tylenol . For a month  Recheck liver  panel ( no ov needed ) if ok then plan recheck. Yearly or as advised    Health Maintenance, Male A healthy lifestyle and preventative care can promote health and wellness.  Maintain regular health, dental, and eye exams.  Eat a healthy diet. Foods like vegetables, fruits, whole grains, low-fat dairy products, and lean protein foods contain the nutrients you need and are low in calories. Decrease your intake of foods high in solid fats, added sugars, and salt. Get information about a proper diet from your health care provider, if necessary.  Regular physical exercise is one of the most important things you can do for your health.  Most adults should get at least 150 minutes of moderate-intensity exercise (any activity that increases your heart rate and causes you to sweat) each week. In addition, most adults need muscle-strengthening exercises on 2 or more days a week.   Maintain a healthy weight. The body mass index (BMI) is a screening tool to identify possible weight problems. It provides an estimate of body fat based on height and weight. Your health care provider can find your BMI and can help you achieve or maintain a healthy weight. For males 20 years and older:  A BMI below 18.5 is considered underweight.  A BMI of 18.5 to 24.9 is normal.  A BMI of 25 to 29.9 is considered overweight.  A BMI of 30 and above is considered obese.  Maintain normal blood lipids and cholesterol by exercising and minimizing your intake of saturated fat. Eat a balanced diet with plenty of fruits and vegetables. Blood tests for lipids and cholesterol should begin at age 55 and be repeated every 5 years. If your lipid or cholesterol levels are high, you are over age 18, or you are at high risk for heart disease, you may need your cholesterol levels checked more frequently.Ongoing high lipid and cholesterol levels should be treated with medicines if diet and exercise are not working.  If you smoke, find out from your health care provider how to quit. If you do not use tobacco, do not start.  Lung cancer screening is recommended for adults aged 65-80 years who are at high risk for developing lung cancer because of a history of smoking. A yearly low-dose CT scan of the lungs is recommended for people who have at least a 30-pack-year history of smoking and are current smokers or have quit within the past 15 years. A pack year of smoking is smoking an average of 1 pack of cigarettes a day for 1 year (for example, a 30-pack-year history of smoking could mean smoking 1 pack a day for 30 years or 2 packs a day  for 15 years). Yearly screening should  continue until the smoker has stopped smoking for at least 15 years. Yearly screening should be stopped for people who develop a health problem that would prevent them from having lung cancer treatment.  If you choose to drink alcohol, do not have more than 2 drinks per day. One drink is considered to be 12 oz (360 mL) of beer, 5 oz (150 mL) of wine, or 1.5 oz (45 mL) of liquor.  Avoid the use of street drugs. Do not share needles with anyone. Ask for help if you need support or instructions about stopping the use of drugs.  High blood pressure causes heart disease and increases the risk of stroke. High blood pressure is more likely to develop in:  People who have blood pressure in the end of the normal range (100-139/85-89 mm Hg).  People who are overweight or obese.  People who are African American.  If you are 65-64 years of age, have your blood pressure checked every 3-5 years. If you are 72 years of age or older, have your blood pressure checked every year. You should have your blood pressure measured twice--once when you are at a hospital or clinic, and once when you are not at a hospital or clinic. Record the average of the two measurements. To check your blood pressure when you are not at a hospital or clinic, you can use:  An automated blood pressure machine at a pharmacy.  A home blood pressure monitor.  If you are 34-15 years old, ask your health care provider if you should take aspirin to prevent heart disease.  Diabetes screening involves taking a blood sample to check your fasting blood sugar level. This should be done once every 3 years after age 38 if you are at a normal weight and without risk factors for diabetes. Testing should be considered at a younger age or be carried out more frequently if you are overweight and have at least 1 risk factor for diabetes.  Colorectal cancer can be detected and often prevented. Most routine colorectal cancer screening begins at the age of  63 and continues through age 41. However, your health care provider may recommend screening at an earlier age if you have risk factors for colon cancer. On a yearly basis, your health care provider may provide home test kits to check for hidden blood in the stool. A small camera at the end of a tube may be used to directly examine the colon (sigmoidoscopy or colonoscopy) to detect the earliest forms of colorectal cancer. Talk to your health care provider about this at age 27 when routine screening begins. A direct exam of the colon should be repeated every 5-10 years through age 66, unless early forms of precancerous polyps or small growths are found.  People who are at an increased risk for hepatitis B should be screened for this virus. You are considered at high risk for hepatitis B if:  You were born in a country where hepatitis B occurs often. Talk with your health care provider about which countries are considered high risk.  Your parents were born in a high-risk country and you have not received a shot to protect against hepatitis B (hepatitis B vaccine).  You have HIV or AIDS.  You use needles to inject street drugs.  You live with, or have sex with, someone who has hepatitis B.  You are a man who has sex with other men (MSM).  You get hemodialysis  treatment.  You take certain medicines for conditions like cancer, organ transplantation, and autoimmune conditions.  Hepatitis C blood testing is recommended for all people born from [birth year] through 1965 and any individual with known risk factors for hepatitis C.  Healthy men should no longer receive prostate-specific antigen (PSA) blood tests as part of routine cancer screening. Talk to your health care provider about prostate cancer screening.  Testicular cancer screening is not recommended for adolescents or adult males who have no symptoms. Screening includes self-exam, a health care provider exam, and other screening tests. Consult with  your health care provider about any symptoms you have or any concerns you have about testicular cancer.  Practice safe sex. Use condoms and avoid high-risk sexual practices to reduce the spread of sexually transmitted infections (STIs).  You should be screened for STIs, including gonorrhea and chlamydia if:  You are sexually active and are younger than 24 years.  You are older than 24 years, and your health care provider tells you that you are at risk for this type of infection.  Your sexual activity has changed since you were last screened, and you are at an increased risk for chlamydia or gonorrhea. Ask your health care provider if you are at risk.  If you are at risk of being infected with HIV, it is recommended that you take a prescription medicine daily to prevent HIV infection. This is called pre-exposure prophylaxis (PrEP). You are considered at risk if:  You are a man who has sex with other men (MSM).  You are a heterosexual man who is sexually active with multiple partners.  You take drugs by injection.  You are sexually active with a partner who has HIV.  Talk with your health care provider about whether you are at high risk of being infected with HIV. If you choose to begin PrEP, you should first be tested for HIV. You should then be tested every 3 months for as long as you are taking PrEP.  Use sunscreen. Apply sunscreen liberally and repeatedly throughout the day. You should seek shade when your shadow is shorter than you. Protect yourself by wearing long sleeves, pants, a wide-brimmed hat, and sunglasses year round whenever you are outdoors.  Tell your health care provider of new moles or changes in moles, especially if there is a change in shape or color. Also, tell your health care provider if a mole is larger than the size of a pencil eraser.  A one-time screening for abdominal aortic aneurysm (AAA) and surgical repair of large AAAs by ultrasound is recommended for men  aged 43-75 years who are current or former smokers.  Stay current with your vaccines (immunizations).   This information is not intended to replace advice given to you by your health care provider. Make sure you discuss any questions you have with your health care provider.   Document Released: 02/15/2008 Document Revised: 09/09/2014 Document Reviewed: 01/14/2011 Elsevier Interactive Patient Education 2016 [place] K. Panosh M.D.

## 2015-11-17 ENCOUNTER — Other Ambulatory Visit (INDEPENDENT_AMBULATORY_CARE_PROVIDER_SITE_OTHER): Payer: BC Managed Care – PPO

## 2015-11-17 DIAGNOSIS — R945 Abnormal results of liver function studies: Secondary | ICD-10-CM

## 2015-11-17 DIAGNOSIS — R7989 Other specified abnormal findings of blood chemistry: Secondary | ICD-10-CM | POA: Diagnosis not present

## 2015-11-17 LAB — HEPATIC FUNCTION PANEL
ALBUMIN: 3.9 g/dL (ref 3.5–5.2)
ALK PHOS: 33 U/L — AB (ref 39–117)
ALT: 61 U/L — ABNORMAL HIGH (ref 0–53)
AST: 37 U/L (ref 0–37)
BILIRUBIN DIRECT: 0.1 mg/dL (ref 0.0–0.3)
TOTAL PROTEIN: 7.4 g/dL (ref 6.0–8.3)
Total Bilirubin: 0.6 mg/dL (ref 0.2–1.2)

## 2015-11-17 LAB — FERRITIN: Ferritin: 177.3 ng/mL (ref 22.0–322.0)

## 2015-11-28 ENCOUNTER — Telehealth: Payer: Self-pay | Admitting: Internal Medicine

## 2015-11-28 NOTE — Telephone Encounter (Signed)
Pt had labs done on 11/17/15 and would like a call back with the results

## 2015-11-29 NOTE — Telephone Encounter (Signed)
Left a message for a return call.  See result note.  Will now close this note. 

## 2015-11-29 NOTE — Telephone Encounter (Signed)
Apology for late reply  Liver test  Still mildy  abnormal  But better  Iron level is normal  Would  Repeat lfts in 3 months

## 2015-12-08 ENCOUNTER — Telehealth: Payer: Self-pay | Admitting: Internal Medicine

## 2015-12-08 NOTE — Telephone Encounter (Signed)
Pt would like to add a liver check and a thyroid added to his labs on 04/12/16. Pt states he thinks he is having some thyroid issues.

## 2015-12-11 NOTE — Telephone Encounter (Signed)
LFTS  Ok  As planned  From last labs in summer   Record   what sx he is having  That make him think a thyroid issue   To be able add on tsh and free t4

## 2015-12-11 NOTE — Telephone Encounter (Signed)
Left a message for a return call.

## 2015-12-12 ENCOUNTER — Other Ambulatory Visit: Payer: Self-pay | Admitting: Family Medicine

## 2015-12-12 DIAGNOSIS — R945 Abnormal results of liver function studies: Secondary | ICD-10-CM

## 2015-12-12 DIAGNOSIS — R7989 Other specified abnormal findings of blood chemistry: Secondary | ICD-10-CM

## 2015-12-12 DIAGNOSIS — R5383 Other fatigue: Secondary | ICD-10-CM

## 2015-12-12 NOTE — Telephone Encounter (Signed)
Spoke to the pt.  He said he has been feeling fatigue and is worried about his thyroid.  Orders placed in the system.

## 2015-12-15 ENCOUNTER — Ambulatory Visit (INDEPENDENT_AMBULATORY_CARE_PROVIDER_SITE_OTHER): Payer: BC Managed Care – PPO | Admitting: Cardiovascular Disease

## 2015-12-15 ENCOUNTER — Encounter: Payer: Self-pay | Admitting: Cardiovascular Disease

## 2015-12-15 VITALS — BP 110/70 | HR 93 | Ht 72.0 in | Wt 224.0 lb

## 2015-12-15 DIAGNOSIS — R7989 Other specified abnormal findings of blood chemistry: Secondary | ICD-10-CM

## 2015-12-15 DIAGNOSIS — I2510 Atherosclerotic heart disease of native coronary artery without angina pectoris: Secondary | ICD-10-CM

## 2015-12-15 DIAGNOSIS — E785 Hyperlipidemia, unspecified: Secondary | ICD-10-CM

## 2015-12-15 DIAGNOSIS — R945 Abnormal results of liver function studies: Secondary | ICD-10-CM

## 2015-12-15 NOTE — Patient Instructions (Signed)
Your physician wants you to follow-up in: 1 Year. You will receive a reminder letter in the mail two months in advance. If you don't receive a letter, please call our office to schedule the follow-up appointment.  

## 2015-12-16 ENCOUNTER — Encounter: Payer: Self-pay | Admitting: Cardiovascular Disease

## 2015-12-16 DIAGNOSIS — R945 Abnormal results of liver function studies: Secondary | ICD-10-CM

## 2015-12-16 DIAGNOSIS — R7989 Other specified abnormal findings of blood chemistry: Secondary | ICD-10-CM | POA: Insufficient documentation

## 2015-12-16 NOTE — Progress Notes (Signed)
Patient ID: Jon Carter, male   DOB: [birth year], 66 y.o.   MRN: [id number]    Primary M.D.: Dr. Mariann Laster put nausea  HPI: Jon Carter is a 65 y.o. male who presents to the office today for one year cardiology evaluation.  Jon Carter presented with an acute coronary syndrome/ST segment elevation myocardial infarction on 04/26/2009. I took him acutely to the cardiac catheterization laboratory where he was found to have total occlusion of his mid circumflex coronary artery. He had an excellent DTB time of less than 40 minutes and ultimately had a Xience DES 2.75x18 mm stent inserted into the circumflex, postdilated to 3.0 mm. Subsequently, he has been demonstrated to have complete salvage of myocardium. Initially, he had significant anxiety issues following his acute coronary syndrome. Ultimately, this has resolved.  He works as an Air traffic controller.  Typically he is off the summer.  For the last several years he has been traveling extensively to both [place] as well as [place] and [place].  He remains active.  He denies any episodes of chest pain.  He continues to exercise at least 5 days per week at the gym.  He denies PND, orthopnea.  He is resolved.    His last stress test  in August 2013 continued to show normal perfusion.  In the past, he had some issues with mild thrombocytopenia.  He has been maintained on Crestor 20 mg daily.  He is on aspirin.  He's no longer taking any beta blocker or ACE inhibitor.  His last lipid studies in January 2017 were excellent with a total cholesterol 99, LDL 45, VLDL 23, and triglycerides 116.  HDL was low at 30.  Renal function was normal.  Recent transaminases were mildly elevated with an ALT of 61 and AST normal at 37.  In June he will be traveling to [place] and [name] with his wife for 2 months.  Past Medical History  Diagnosis Date  . Hyperlipidemia     Hx of low HDL zero CAC in 2000 at [facility]  . STEMI (ST elevation myocardial infarction) ([facility])  04/26/2009    left circumflex DES   . Alcohol addiction ([place])   . Adjustment disorder with anxiety 06/13/2009    Qualifier: Diagnosis of  By: Regis Bill MD, Standley Brooking  related to his sudden MI underwent counseling takes low-dose medication as needed had side effects from an SSRI   . Low testosterone     Past Surgical History  Procedure Laterality Date  . Cataract extraction Left 2009    [facility]  . Coronary angioplasty with stent placement  04/26/2009    ACS/STEMI - total occlusion of mid Cfx - Xience DES 2.75x79m (Dr. TCorky Downs  . Cardiac catheterization  05/02/2009    UA - patent Cfx stent, 50-60% LAD, 50-60% RCA with normal LV funtion (Dr. JAdora Fridge  . Transthoracic echocardiogram  05/2009    EF=50-55%; trace MR; mild TR, normal RSVP; AV mildly sclerotic  . Nm myocar perf wall motion  04/2012    bruce myoview; mild perfusion defect in basal inferior region (infarct/scar or overlying attenuation); post-stress EF 53%, EKG negative for ischemia, hypertensive response to exercise; abnormal study but unchanged from previous study    Allergies  Allergen Reactions  . Escitalopram Oxalate     REACTION: flushing of the skin and sick all over ?  only took 1 pill    Current Outpatient Prescriptions  Medication Sig Dispense Refill  . aspirin 81 MG tablet Take  81 mg by mouth daily.      . Coenzyme Q10 150 MG CAPS Take by mouth.      . doxycycline (DORYX) 100 MG DR capsule Take 100 mg by mouth daily.     . nitroGLYCERIN (NITROSTAT) 0.4 MG SL tablet Place 0.4 mg under the tongue every 5 (five) minutes as needed.      . rosuvastatin (CRESTOR) 20 MG tablet Take 1 tablet (20 mg total) by mouth daily. 90 tablet 3   No current facility-administered medications for this visit.    Social History   Social History  . Marital Status: Married    Spouse Name: N/A  . Number of Children: N/A  . Years of Education: N/A   Occupational History  . phd teaching [employer]   Social History Main  Topics  . Smoking status: Never Smoker   . Smokeless tobacco: Not on file  . Alcohol Use: 1.0 oz/week    2 drink(s) per week  . Drug Use: No  . Sexual Activity: Not on file   Other Topics Concern  . 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  He is retired from [employer] after being in [employer] for 30 years.  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  He he has the summer off and typically travels abroad and remains active.  He exercises daily. There is no tobacco use.  ROS General: Negative; No fevers, chills, or night sweats;  HEENT: Negative; No changes in vision or hearing, sinus congestion, difficulty swallowing Pulmonary: Negative; No cough, wheezing, shortness of breath, hemoptysis Cardiovascular: Negative; No chest pain, presyncope, syncope, palpitations GI: Negative; No nausea, vomiting, diarrhea, or abdominal pain GU: Negative; No dysuria, hematuria, or difficulty voiding Musculoskeletal: Negative; no myalgias, joint pain, or weakness Hematologic/Oncology: Negative; no easy bruising, bleeding Endocrine: Negative; no heat/cold intolerance; no diabetes Neuro: Negative; no changes in balance, headaches Skin: Negative; No rashes or skin lesions Psychiatric: Negative; No behavioral problems, depression Sleep: Negative; No snoring, daytime sleepiness, hypersomnolence, bruxism, restless legs, hypnogognic hallucinations, no cataplexy Other comprehensive 14 point system review is negative.   PE BP 110/70 mmHg  Pulse 93  Ht 6' (1.829 m)  Wt 224 lb (101.606 kg)  BMI 30.37 kg/m2   Wt Readings from Last 3 Encounters:  12/15/15 224 lb (101.606 kg)  10/10/15 228 lb 3.7 oz (103.524  kg)  12/16/14 227 lb 12.8 oz (103.329 kg)   General: Alert, oriented, no distress.  Skin: normal turgor, no rashes HEENT: Normocephalic, atraumatic. Pupils round and reactive; sclera anicteric;no lid lag. No xanthelasmas. Nose without nasal septal hypertrophy Mouth/Parynx benign; Mallinpatti scale 2 Neck: No JVD, no carotid bruits with normal carotid upstroke. Chest: Without chest or tenderness. No chest wall tenderness Lungs: clear to ausculatation and percussion; no wheezing or rales Heart: RRR, s1 s2 normal; no murmur, rubs, thrills or heaves. Abdomen: soft, nontender; no hepatosplenomehaly, BS+; abdominal aorta nontender and not dilated by palpation. Back: No CVA tenderness Pulses 2+ Extremities: no clubbing cyanosis or edema, Homan's sign negative  Neurologic: grossly nonfocal Psychological: Normal affect and mood.  ECG (independently read by me): Normal sinus rhythm at 93 bpm.  No ectopy.  Normal intervals.  No ECG evidence of prior MI.  April 2016 ECG (independently read by me): Normal sinus rhythm at 64.  No ectopy.  Normal intervals.  January 2015 ECG (independently interpreted by me): Normal sinus rhythm at 71 beats per minute. No significant ST changes. Normal intervals.  Last ECG of 03/26/2013: Normal sinus rhythm at 71 beats per minute. Normal intervals.  LABS:  BMP Latest Ref Rng 10/03/2015 03/31/2015 04/27/2013  Glucose 70 - 99 mg/dL 93 81 92  BUN 6 - 23 mg/dL _0 Creatinine 0.40 - 1.50 mg/dL 0.79 0.85 0.9  Sodium 135 - 145 mEq/L 141 140 136  Potassium 3.5 - 5.1 mEq/L 4.4 4.1 4.1  Chloride 96 - 112 mEq/L 104 104 104  CO2 19 - 32 mEq/L 32 27 28  Calcium 8.4 - 10.5 mg/dL 9.4 9.6 9.2    Hepatic Function Latest Ref Rng 11/17/2015 10/03/2015 03/31/2015  Total Protein 6.0 - 8.3 g/dL 7.4 7.8 8.0  Albumin 3.5 - 5.2 g/dL 3.9 4.4 4.3  AST 0 - 37 U/L 37 41(H) 31  ALT 0 - 53 U/L 61(H) 58(H) 38  Alk Phosphatase 39 - 117 U/L 33(L) 36(L) 37(L)  Total Bilirubin 0.2 - 1.2  mg/dL 0.6 0.7 0.7  Bilirubin, Direct 0.0 - 0.3 mg/dL 0.1 0.2 -    CBC Latest Ref Rng 10/03/2015 03/31/2015 10/27/2013  WBC 4.0 - 10.5 K/uL 4.5 4.7 4.9  Hemoglobin 13.0 - 17.0 g/dL 14.8 15.7 15.1  Hematocrit 39.0 - 52.0 % 45.4 47.4 46.7  Platelets 150.0 - 400.0 K/uL 114.0(L) 96(L) 90.0(L)   Lab Results  Component Value Date   TSH 4.18 10/03/2015    BNP    Component Value Date/Time   PROBNP 99.0 04/28/2009 0420    Lipid Panel     Component Value Date/Time   CHOL 99 10/03/2015 1008   TRIG 116.0 10/03/2015 1008   TRIG 347* 07/17/2006 1010   HDL 30.50* 10/03/2015 1008   CHOLHDL 3 10/03/2015 1008   CHOLHDL 6.9 CALC 07/17/2006 1010   VLDL 23.2 10/03/2015 1008   LDLCALC 45 10/03/2015 1008    RADIOLOGY: No results found.    ASSESSMENT AND PLAN: Jon Carter is a 65 year old white male who continues to do well now almost 7 years following his acute coronary syndrome when he presented with left circumflex occlusion and underwent successful  early revascularization. He  has had complete salvage of myocardium verified by nuclear imaging.  Presently, he is not having any recurrent anginal symptoms and continues to experience excellent exercise program of exercises 5-6 days per week.  He has traveled to [place] as well as to [place] and [place] where he is hiked vigorously without recurrent symptomatology.  Remotely he had been on Niaspan in addition to Crestor.  He recently had laboratory done by Dr. Lolita Cram.  He has very mild transaminase elevation.  He does drink occasional wine.  I have suggested that he have repeat LFTs in May and if they remain mildly elevated  .  I would recommend he decrease his Crestor to 10 mg daily.  With his current LDL at 45 implant.  Extensive trip to [place] where he may be drinking significantly more wine on vacation.  He continues to be angina free.  He is tolerating low-dose aspirin.  His platelet count has improved and was 114,000.  In January 2017.  As  long as he remains stable, I will see him in one year for reevaluation.  Time spent: 25 minutes  Troy Sine, MD, [facility]  12/16/2015 3:44 PM

## 2016-01-08 ENCOUNTER — Other Ambulatory Visit (INDEPENDENT_AMBULATORY_CARE_PROVIDER_SITE_OTHER): Payer: BC Managed Care – PPO

## 2016-01-08 DIAGNOSIS — R945 Abnormal results of liver function studies: Secondary | ICD-10-CM

## 2016-01-08 DIAGNOSIS — R7989 Other specified abnormal findings of blood chemistry: Secondary | ICD-10-CM

## 2016-01-08 DIAGNOSIS — R5383 Other fatigue: Secondary | ICD-10-CM

## 2016-01-08 LAB — HEPATIC FUNCTION PANEL
ALK PHOS: 31 U/L — AB (ref 39–117)
ALT: 56 U/L — AB (ref 0–53)
AST: 38 U/L — AB (ref 0–37)
Albumin: 4.1 g/dL (ref 3.5–5.2)
Bilirubin, Direct: 0.1 mg/dL (ref 0.0–0.3)
TOTAL PROTEIN: 7.7 g/dL (ref 6.0–8.3)
Total Bilirubin: 0.5 mg/dL (ref 0.2–1.2)

## 2016-01-08 LAB — T4, FREE: Free T4: 1.02 ng/dL (ref 0.60–1.60)

## 2016-01-08 LAB — TSH: TSH: 5.56 u[IU]/mL — ABNORMAL HIGH (ref 0.35–4.50)

## 2016-03-31 ENCOUNTER — Other Ambulatory Visit: Payer: Self-pay | Admitting: Cardiovascular Disease

## 2016-04-12 ENCOUNTER — Other Ambulatory Visit (INDEPENDENT_AMBULATORY_CARE_PROVIDER_SITE_OTHER): Payer: BC Managed Care – PPO

## 2016-04-12 ENCOUNTER — Other Ambulatory Visit: Payer: Self-pay | Admitting: Family Medicine

## 2016-04-12 ENCOUNTER — Other Ambulatory Visit: Payer: BC Managed Care – PPO

## 2016-04-12 DIAGNOSIS — R7989 Other specified abnormal findings of blood chemistry: Secondary | ICD-10-CM

## 2016-04-12 DIAGNOSIS — R945 Abnormal results of liver function studies: Principal | ICD-10-CM

## 2016-04-12 LAB — HEPATIC FUNCTION PANEL
ALBUMIN: 4.2 g/dL (ref 3.5–5.2)
ALK PHOS: 39 U/L (ref 39–117)
ALT: 47 U/L (ref 0–53)
AST: 35 U/L (ref 0–37)
Bilirubin, Direct: 0.1 mg/dL (ref 0.0–0.3)
TOTAL PROTEIN: 7.8 g/dL (ref 6.0–8.3)
Total Bilirubin: 0.5 mg/dL (ref 0.2–1.2)

## 2016-06-11 DIAGNOSIS — Z23 Encounter for immunization: Secondary | ICD-10-CM | POA: Diagnosis not present

## 2016-06-13 DIAGNOSIS — D2372 Other benign neoplasm of skin of left lower limb, including hip: Secondary | ICD-10-CM | POA: Diagnosis not present

## 2016-06-13 DIAGNOSIS — Z86018 Personal history of other benign neoplasm: Secondary | ICD-10-CM | POA: Diagnosis not present

## 2016-06-13 DIAGNOSIS — L820 Inflamed seborrheic keratosis: Secondary | ICD-10-CM | POA: Diagnosis not present

## 2016-06-13 DIAGNOSIS — L918 Other hypertrophic disorders of the skin: Secondary | ICD-10-CM | POA: Diagnosis not present

## 2016-06-13 DIAGNOSIS — D2261 Melanocytic nevi of right upper limb, including shoulder: Secondary | ICD-10-CM | POA: Diagnosis not present

## 2016-06-13 DIAGNOSIS — Z23 Encounter for immunization: Secondary | ICD-10-CM | POA: Diagnosis not present

## 2016-06-13 DIAGNOSIS — Z85828 Personal history of other malignant neoplasm of skin: Secondary | ICD-10-CM | POA: Diagnosis not present

## 2016-06-13 DIAGNOSIS — L821 Other seborrheic keratosis: Secondary | ICD-10-CM | POA: Diagnosis not present

## 2016-06-13 DIAGNOSIS — D225 Melanocytic nevi of trunk: Secondary | ICD-10-CM | POA: Diagnosis not present

## 2016-10-08 ENCOUNTER — Other Ambulatory Visit: Payer: Self-pay | Admitting: Family Medicine

## 2016-10-08 ENCOUNTER — Other Ambulatory Visit (INDEPENDENT_AMBULATORY_CARE_PROVIDER_SITE_OTHER): Payer: BC Managed Care – PPO

## 2016-10-08 ENCOUNTER — Other Ambulatory Visit: Payer: BC Managed Care – PPO

## 2016-10-08 DIAGNOSIS — R7989 Other specified abnormal findings of blood chemistry: Secondary | ICD-10-CM

## 2016-10-08 DIAGNOSIS — Z0000 Encounter for general adult medical examination without abnormal findings: Secondary | ICD-10-CM | POA: Diagnosis not present

## 2016-10-08 LAB — BASIC METABOLIC PANEL
BUN: 17 mg/dL (ref 6–23)
CO2: 28 mEq/L (ref 19–32)
CREATININE: 0.87 mg/dL (ref 0.40–1.50)
Calcium: 9 mg/dL (ref 8.4–10.5)
Chloride: 106 mEq/L (ref 96–112)
GFR: 93.33 mL/min (ref >60.00)
Glucose, Bld: 99 mg/dL (ref 70–99)
Potassium: 4 mEq/L (ref 3.5–5.1)
Sodium: 139 mEq/L (ref 135–145)

## 2016-10-08 LAB — HEPATIC FUNCTION PANEL
ALK PHOS: 33 U/L — AB (ref 39–117)
ALT: 47 U/L (ref 0–53)
AST: 35 U/L (ref 0–37)
Albumin: 4.2 g/dL (ref 3.5–5.2)
BILIRUBIN DIRECT: 0.1 mg/dL (ref 0.0–0.3)
TOTAL PROTEIN: 7.8 g/dL (ref 6.0–8.3)
Total Bilirubin: 0.5 mg/dL (ref 0.2–1.2)

## 2016-10-08 LAB — CBC WITH DIFFERENTIAL/PLATELET
Basophils Absolute: 0 10*3/uL (ref 0.0–0.1)
Basophils Relative: 0.2 % (ref 0.0–3.0)
EOS ABS: 0 10*3/uL (ref 0.0–0.7)
Eosinophils Relative: 0.6 % (ref 0.0–5.0)
HEMATOCRIT: 43 % (ref 39.0–52.0)
HEMOGLOBIN: 14.6 g/dL (ref 13.0–17.0)
LYMPHS PCT: 33.6 % (ref 12.0–46.0)
Lymphs Abs: 1.6 10*3/uL (ref 0.7–4.0)
MCHC: 33.9 g/dL (ref 30.0–36.0)
MCV: 89.9 fl (ref 78.0–100.0)
Monocytes Absolute: 2 10*3/uL — ABNORMAL HIGH (ref 0.1–1.0)
Monocytes Relative: 42.4 % — ABNORMAL HIGH (ref 3.0–12.0)
Neutro Abs: 1.1 10*3/uL — ABNORMAL LOW (ref 1.4–7.7)
Neutrophils Relative %: 23.2 % — ABNORMAL LOW (ref 43.0–77.0)
Platelets: 72 10*3/uL — ABNORMAL LOW (ref 150.0–400.0)
RBC: 4.78 Mil/uL (ref 4.22–5.81)
RDW: 15.3 % (ref 11.5–15.5)
WBC: 4.8 10*3/uL (ref 4.0–10.5)

## 2016-10-08 LAB — LIPID PANEL
Cholesterol: 100 mg/dL (ref 0–200)
HDL: 23.9 mg/dL — AB (ref >39.00)
LDL Cholesterol: 40 mg/dL (ref 0–99)
NONHDL: 75.8
Total CHOL/HDL Ratio: 4
Triglycerides: 177 mg/dL — ABNORMAL HIGH (ref 0.0–149.0)
VLDL: 35.4 mg/dL (ref 0.0–40.0)

## 2016-10-08 LAB — PSA: PSA: 0.3 ng/mL (ref 0.10–4.00)

## 2016-10-08 LAB — TSH: TSH: 5.25 u[IU]/mL — ABNORMAL HIGH (ref 0.35–4.50)

## 2016-10-09 LAB — PATHOLOGIST SMEAR REVIEW

## 2016-10-14 NOTE — Progress Notes (Signed)
Pre visit review using our clinic review tool, if applicable. No additional management support is needed unless otherwise documented below in the visit note.  Chief Complaint  Patient presents with  . Annual Exam    HPI: Patient  Jon Carter  66 y.o. comes in today for [facility] visit  He is doing well exercise traveling diet going to retire in May. No cardiovascular symptoms is supposed to see his cardiologist soon. Only symptoms are he snores some and his wife is worried about sleep apnea but he feels fine.  Health Maintenance  Topic Date Due  . HIV Screening  01/30/2017 (Originally 10/31/1965)  . PNA vac Low Risk Adult (2 of 2 - PPSV23) 06/11/2017  . TETANUS/TDAP  04/19/2018  . COLONOSCOPY  04/14/2024  . INFLUENZA VACCINE  Addressed  . ZOSTAVAX  Completed  . Hepatitis C Screening  Completed   Health Maintenance Review LIFESTYLE:  Exercise:  Every day  Tobacco/ETS: no Alcohol:  2 per day   ocass more Sugar beverages:  no Sleep:   8 hours  Snores  Drug use: no HH of    2  Work:  Daily to retire in May     ROS:  Bottom of foot sometimes  Sore in am and then ok   Treadmill most exercise and weights  GEN/ HEENT: No fever, significant weight changes sweats headaches vision problems hearing changes, CV/ PULM; No chest pain shortness of breath cough, syncope,edema  change in exercise tolerance. GI /GU: No adominal pain, vomiting, change in bowel habits. No blood in the stool. No significant GU symptoms. SKIN/HEME: ,no acute skin rashes suspicious lesions or bleeding. No lymphadenopathy, nodules, masses.  NEURO/ PSYCH:  No neurologic signs such as weakness numbness. No depression anxiety. IMM/ Allergy: No unusual infections.  Allergy .   REST of 12 system review negative except as per HPI   Past Medical History:  Diagnosis Date  . Adjustment disorder with anxiety 06/13/2009   Qualifier: Diagnosis of  By: Regis Bill MD, Standley Brooking  related to his sudden MI underwent  counseling takes low-dose medication as needed had side effects from an SSRI   . Alcohol addiction ([facility])   . Hyperlipidemia    Hx of low HDL zero CAC in 2000 at [facility]  . Low testosterone   . STEMI (ST elevation myocardial infarction) ([facility]) 04/26/2009   left circumflex DES     Past Surgical History:  Procedure Laterality Date  . CARDIAC CATHETERIZATION  05/02/2009   UA - patent Cfx stent, 50-60% LAD, 50-60% RCA with normal LV funtion (Dr. Adora Fridge)  . CATARACT EXTRACTION Left 2009   [facility]  . CORONARY ANGIOPLASTY WITH STENT PLACEMENT  04/26/2009   ACS/STEMI - total occlusion of mid Cfx - Xience DES 2.75x29mm (Dr. Corky Downs)  . NM MYOCAR PERF WALL MOTION  04/2012   bruce myoview; mild perfusion defect in basal inferior region (infarct/scar or overlying attenuation); post-stress EF 53%, EKG negative for ischemia, hypertensive response to exercise; abnormal study but unchanged from previous study  . TRANSTHORACIC ECHOCARDIOGRAM  05/2009   EF=50-55%; trace MR; mild TR, normal RSVP; AV mildly sclerotic    Family History  Problem Relation Age of Onset  . Tongue cancer Father     tobacco  . Throat cancer Father   . Cirrhosis Father     also heart problems from etoh   . Hypertension Mother   . Heart Problems      mom  in 52s  Social History   Social History  . Marital status: Married    Spouse name: N/A  . Number of children: N/A  . Years of education: N/A   Occupational History  . 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 Smoking status: Never Smoker  . Smokeless tobacco: Never Used  . Alcohol use 1.0 oz/week    2 Standard drinks or equivalent per week  . Drug use: No  . Sexual activity: Not on file   Other Topics Concern  . 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 aspirin 81 MG tablet Take 81 mg by mouth daily.      . Coenzyme Q10 150 MG CAPS Take by mouth.      . doxycycline (DORYX) 100 MG DR capsule Take 100 mg by mouth daily.     . nitroGLYCERIN (NITROSTAT) 0.4 MG SL tablet Place 0.4 mg under the tongue every 5 (five) minutes as needed.      . rosuvastatin (CRESTOR) 20 MG tablet take 1 tablet by mouth once daily 90 tablet 3   No facility-administered medications prior to visit.      EXAM:  BP 126/74 (BP Location: Right Arm, Patient Position: Sitting, Cuff Size: Large)   Temp 97.8 F (36.6 C) (Oral)   Ht 5' 11.5" (1.816 m)   Wt 229 lb (103.9 kg)   BMI 31.49 kg/m   Body mass index is 31.49 kg/m. Wt Readings from Last 3 Encounters:  10/15/16 229 lb (103.9 kg)  12/15/15 224 lb (101.6 kg)  10/10/15 228 lb 3.7 oz (103.5 kg)    Physical Exam: Vital signs reviewed [id number] is a well-developed well-nourished alert cooperative    who appearsr stated age in no acute distress.  HEENT: normocephalic atraumatic , Eyes: PERRL EOM's full, conjunctiva clear, Nares: paten,t no deformity discharge or tenderness., Ears: no deformity EAC's clear TMs with normal landmarks. Mouth: clear OP, no lesions, edema.  Moist mucous membranes. Dentition in adequate repair. NECK: supple without masses, thyromegaly or bruits. CHEST/PULM:  Clear to auscultation and percussion breath sounds equal no wheeze , rales or rhonchi. No chest wall deformities or tenderness. Breast: normal by inspection . No dimpling, discharge, masses, tenderness or discharge . CV: PMI is nondisplaced, S1 S2 no gallops, murmurs, rubs. Peripheral pulses are full without delay.No JVD .  ABDOMEN: Bowel sounds normal nontender  No guard or rebound, no hepato splenomegal no CVA tenderness.  No hernia. Extremtities:  No clubbing cyanosis or edema, no acute joint  swelling or redness no focal atrophy NEURO:  Oriented x3, cranial nerves 3-12 appear to be intact, no obvious focal weakness,gait within normal limits no abnormal reflexes or asymmetrical SKIN: No acute rashes normal turgor, color, no bruising or petechiae. PSYCH: Oriented, good eye contact, no obvious depression anxiety, cognition and judgment appear normal. LN: no cervical axillary inguinal adenopathy  Lab Results  Component Value Date   WBC 4.8 10/08/2016   HGB 14.6 10/08/2016   HCT 43.0 10/08/2016   PLT 72.0 Repeated and  verified X2. (L) 10/08/2016   GLUCOSE 99 10/08/2016   CHOL 100 10/08/2016   TRIG 177.0 (H) 10/08/2016   HDL 23.90 (L) 10/08/2016   LDLDIRECT 128.7 01/04/2009   LDLCALC 40 10/08/2016   ALT 47 10/08/2016   AST 35 10/08/2016   NA 139 10/08/2016   K 4.0 10/08/2016   CL 106 10/08/2016   CREATININE 0.87 10/08/2016   BUN 17 10/08/2016   CO2 28 10/08/2016   TSH 5.25 (H) 10/08/2016   PSA 0.30 10/08/2016   INR 1.1 05/01/2009   Path Review SEE NOTE   Comments: Leukopenia due to absolute neutropenia. Absolute monocytosis. Rare  mature neutrophils are seen. No immature cells are identified.  Thrombocytopenia with some large platelets seen. No platelet clumps  identified. Scattered Elliptocytes noted.  Reviewed by Francis Gaines Mammarappallil MD (Electronic Signature on File)  10/09/2016    Wt Readings from Last 3 Encounters:  10/15/16 229 lb (103.9 kg)  12/15/15 224 lb (101.6 kg)  10/10/15 228 lb 3.7 oz (103.5 kg)     BP Readings from Last 3 Encounters:  10/15/16 126/74  12/15/15 110/70  10/10/15 134/78    Lab results reviewed with patient   ASSESSMENT AND PLAN:  Discussed the following assessment and plan:  Visit for preventive health examination  Medication management  Thrombocytopenia ([place])  Hyperlipidemia, unspecified hyperlipidemia type - Attention to triglycerides simple sugars and fats LDL is at goal.  Monocytosis relative  -  neurtopenia  Elevated TSH - Slightly elevated follow. He is not sure if he had hepatitis B and hepatitis A vaccination but he was in the [employer] and received a lot of vaccines. He should check on this and let us know if he has not had them so we can advise about getting them done since he will continue to world travel. He'll be contacted about a hematology consult or revisit his thrombocytopenia is a little more severe this time but no symptoms and he also has a monocytosis that is slightly progressing but no anemia or leukopenia.  Patient Care Team: Burnis Medin, MD as PCP - General Troy Sine, MD (Cardiology) Calvert Cantor, MD as Consulting Physician (Ophthalmology) Jari Pigg, MD as Consulting Physician (Dermatology) Richmond Campbell, MD as Consulting Physician (Gastroenterology) Patient Instructions  Glad you are doing well.  He will be contacted about a re-consult from hematology about the low platelets abnormal white count. Your platelet count is not low enough to affect her bleeding risk.  Pain attention to avoiding simple sugars simple carbs in animal fats. Your feet may be an early plantar fasciitis but wear good heel support ice your heels at night and stretching avoid barefoot. Your exam is good today. Check in your records to see if you have had a hepatitis A vaccine series 2 oand hepatitis B vaccine usually a series of 3 If not you can call for injection only or have [name] give you an order to get the vaccines elsewhere. These are good lifetime vaccines for travel.   If all ok then cpx in a year or as needed      Standley Brooking. Panosh M.D.

## 2016-10-15 ENCOUNTER — Encounter: Payer: Self-pay | Admitting: Internal Medicine

## 2016-10-15 ENCOUNTER — Ambulatory Visit (INDEPENDENT_AMBULATORY_CARE_PROVIDER_SITE_OTHER): Payer: BC Managed Care – PPO | Admitting: Internal Medicine

## 2016-10-15 VITALS — BP 126/74 | Temp 97.8°F | Ht 71.5 in | Wt 229.0 lb

## 2016-10-15 DIAGNOSIS — Z79899 Other long term (current) drug therapy: Secondary | ICD-10-CM

## 2016-10-15 DIAGNOSIS — R7989 Other specified abnormal findings of blood chemistry: Secondary | ICD-10-CM

## 2016-10-15 DIAGNOSIS — E785 Hyperlipidemia, unspecified: Secondary | ICD-10-CM

## 2016-10-15 DIAGNOSIS — D72821 Monocytosis (symptomatic): Secondary | ICD-10-CM

## 2016-10-15 DIAGNOSIS — R946 Abnormal results of thyroid function studies: Secondary | ICD-10-CM

## 2016-10-15 DIAGNOSIS — Z0000 Encounter for general adult medical examination without abnormal findings: Secondary | ICD-10-CM | POA: Diagnosis not present

## 2016-10-15 DIAGNOSIS — D696 Thrombocytopenia, unspecified: Secondary | ICD-10-CM

## 2016-10-15 NOTE — Patient Instructions (Addendum)
Glad you are doing well.  He will be contacted about a re-consult from hematology about the low platelets abnormal white count. Your platelet count is not low enough to affect her bleeding risk.  Pain attention to avoiding simple sugars simple carbs in animal fats. Your feet may be an early plantar fasciitis but wear good heel support ice your heels at night and stretching avoid barefoot. Your exam is good today. Check in your records to see if you have had a hepatitis A vaccine series 2 oand hepatitis B vaccine usually a series of 3 If not you can call for injection only or have [name] give you an order to get the vaccines elsewhere. These are good lifetime vaccines for travel.   If all ok then cpx in a year or as needed

## 2016-10-16 DIAGNOSIS — L719 Rosacea, unspecified: Secondary | ICD-10-CM | POA: Diagnosis not present

## 2016-10-16 DIAGNOSIS — Z23 Encounter for immunization: Secondary | ICD-10-CM | POA: Diagnosis not present

## 2016-10-18 ENCOUNTER — Ambulatory Visit (INDEPENDENT_AMBULATORY_CARE_PROVIDER_SITE_OTHER): Payer: BC Managed Care – PPO | Admitting: Cardiovascular Disease

## 2016-10-18 ENCOUNTER — Encounter: Payer: Self-pay | Admitting: Cardiovascular Disease

## 2016-10-18 VITALS — BP 131/84 | HR 68 | Ht 72.0 in | Wt 227.2 lb

## 2016-10-18 DIAGNOSIS — G478 Other sleep disorders: Secondary | ICD-10-CM | POA: Diagnosis not present

## 2016-10-18 DIAGNOSIS — E162 Hypoglycemia, unspecified: Secondary | ICD-10-CM

## 2016-10-18 DIAGNOSIS — I2510 Atherosclerotic heart disease of native coronary artery without angina pectoris: Secondary | ICD-10-CM | POA: Diagnosis not present

## 2016-10-18 DIAGNOSIS — E781 Pure hyperglyceridemia: Secondary | ICD-10-CM

## 2016-10-18 DIAGNOSIS — I252 Old myocardial infarction: Secondary | ICD-10-CM

## 2016-10-18 NOTE — Progress Notes (Signed)
Patient ID: Jon Carter, male   DOB: 03-Jan-1951, 66 y.o.   MRN: [id number]    Primary M.D.: Dr. Shanon Ace  HPI: Jon Carter is a 66 y.o. male who presents to the office today for a 10 month cardiology evaluation.  Jon Carter presented with an acute coronary syndrome/ST segment elevation myocardial infarction on 04/26/2009. I took him acutely to the cardiac catheterization laboratory where he was found to have total occlusion of his mid circumflex coronary artery. He had an excellent DTB time of less than 40 minutes and ultimately had a Xience DES 2.75x18 mm stent inserted into the circumflex, postdilated to 3.0 mm. Subsequently, he has been demonstrated to have complete salvage of myocardium. Initially, he had significant anxiety issues following his acute coronary syndrome. Ultimately, this has resolved.  He works as an Air traffic controller.  Typically he is off the summer.  For the last several years he has been traveling extensively to both [place] as well as [place] and [place].  He remains active.  He denies any episodes of chest pain.  He continues to exercise at least 5 days per week at the gym.  He denies PND, orthopnea.  He is resolved.    His last stress test  in August 2013 continued to show normal perfusion.  In the past, he had some issues with mild thrombocytopenia.  He has been maintained on Crestor 10 mg daily.  He is on aspirin.  He is no longer taking any beta blocker or ACE inhibitor. Lipid studies in January 2017 were excellent with a total cholesterol 99, LDL 45, VLDL 23, and triglycerides 116.  HDL was low at 30.  Renal function was normal.  Transaminases were mildly elevated with an ALT of 61 and AST normal at 37.   Since I last saw him he traveled to [place] and [name] last summer.  He has been without chest pian or exertional symptoms. He will be retiing in May.  He had recent lab work by Dr. Regis Bill. He has had a h/o reduced platelets, but these were now 72K on  10/09/16. TC was 100; LDL 40 but LDL was low at 23.  LFTs were now normal.  He denies 0chest pain. He works out at [employer] at least 3 -5 days per week doing elliptical workouts.  He will be traveling to [place] and [place] this summer.  Recently his wife has noticed increaed snoring and potential apneic events while he has been sleeping.  He wakes up 2 x night for nocturia. He denies daytime sleepiness. He presents for evaluation.    Epworth Sleepiness Scale: Situation   Chance of Dozing/Sleeping (0 = never , 1 = slight chance , 2 = moderate chance , 3 = high chance )   sitting and reading 0   watching TV 1   sitting inactive in a public place 0   being a passenger in a motor vehicle for an hour or more 1   lying down in the afternoon 3   sitting and talking to someone 0   sitting quietly after lunch (no alcohol) 0   while stopped for a few minutes in traffic as the driver 0   Total Score  5     Past Medical History:  Diagnosis Date  . Adjustment disorder with anxiety 06/13/2009   Qualifier: Diagnosis of  By: Regis Bill MD, Standley Brooking  related to his sudden MI underwent counseling takes low-dose medication as needed had side effects from  an SSRI   . Alcohol addiction ([place])   . Hyperlipidemia    Hx of low HDL zero CAC in 2000 at [place]  . Low testosterone   . STEMI (ST elevation myocardial infarction) ([facility]) 04/26/2009   left circumflex DES     Past Surgical History:  Procedure Laterality Date  . CARDIAC CATHETERIZATION  05/02/2009   UA - patent Cfx stent, 50-60% LAD, 50-60% RCA with normal LV funtion (Dr. Adora Fridge)  . CATARACT EXTRACTION Left 2009   [facility]  . CORONARY ANGIOPLASTY WITH STENT PLACEMENT  04/26/2009   ACS/STEMI - total occlusion of mid Cfx - Xience DES 2.75x3m (Dr. TCorky Downs  . NM MYOCAR PERF WALL MOTION  04/2012   bruce myoview; mild perfusion defect in basal inferior region (infarct/scar or overlying attenuation); post-stress EF 53%, EKG negative for ischemia,  hypertensive response to exercise; abnormal study but unchanged from previous study  . TRANSTHORACIC ECHOCARDIOGRAM  05/2009   EF=50-55%; trace MR; mild TR, normal RSVP; AV mildly sclerotic    Allergies  Allergen Reactions  . Escitalopram Oxalate     REACTION: flushing of the skin and sick all over ?  only took 1 pill    Current Outpatient Prescriptions  Medication Sig Dispense Refill  . aspirin 81 MG tablet Take 81 mg by mouth daily.      . Coenzyme Q10 150 MG CAPS Take by mouth.      . doxycycline (DORYX) 100 MG DR capsule Take 100 mg by mouth daily.     . nitroGLYCERIN (NITROSTAT) 0.4 MG SL tablet Place 0.4 mg under the tongue every 5 (five) minutes as needed.      . rosuvastatin (CRESTOR) 10 MG tablet Take 10 mg by mouth daily.     No current facility-administered medications for this visit.     Social History   Social History  . Marital status: Married    Spouse name: N/A  . Number of children: N/A  . Years of education: N/A   Occupational History  . phd teaching [employer]   Social History Main Topics  . Smoking status: Never Smoker  . Smokeless tobacco: Never Used  . Alcohol use 1.0 oz/week    2 Standard drinks or equivalent per week  . Drug use: No  . Sexual activity: Not on file   Other Topics Concern  . 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  He works as an aAir traffic controllerfor 9 months a year.  He he has the summer off and typically travels abroad and remains active.  He exercises daily. There is no tobacco use.  ROS General: Negative; No fevers, chills, or night sweats;  HEENT: Negative;  No changes in vision or hearing, sinus congestion, difficulty swallowing Pulmonary: Negative; No cough, wheezing, shortness of breath, hemoptysis Cardiovascular: Negative; No chest pain, presyncope, syncope, palpitations GI: Negative; No nausea, vomiting, diarrhea, or abdominal pain GU: Negative; No dysuria, hematuria, or difficulty voiding Musculoskeletal: Negative;  no myalgias, joint pain, or weakness Hematologic/Oncology: Negative; no easy bruising, bleeding Endocrine: Negative; no heat/cold intolerance; no diabetes Neuro: Negative; no changes in balance, headaches Skin: Negative; No rashes or skin lesions Psychiatric: Negative; No behavioral problems, depression Sleep: Negative; No snoring, daytime sleepiness, hypersomnolence, bruxism, restless legs, hypnogognic hallucinations, no cataplexy Other comprehensive 14 point system review is negative.   PE BP 131/84 (BP Location: Right Arm)   Pulse 68   Ht 6' (1.829 m)   Wt 227 lb 3.2 oz (103.1 kg)   BMI 30.81 kg/m    Wt Readings from Last 3 Encounters:  10/18/16 227 lb 3.2 oz (103.1 kg)  10/15/16 229 lb (103.9 kg)  12/15/15 224 lb (101.6 kg)   General: Alert, oriented, no distress.  Skin: normal turgor, no rashes HEENT: Normocephalic, atraumatic. Pupils round and reactive; sclera anicteric;no lid lag. No xanthelasmas. Nose without nasal septal hypertrophy Mouth/Parynx benign; Mallinpatti scale 2 Neck: No JVD, no carotid bruits with normal carotid upstroke. Chest: Without chest or tenderness. No chest wall tenderness Lungs: clear to ausculatation and percussion; no wheezing or rales Heart: RRR, s1 s2 normal; no murmur, rubs, thrills or heaves. Abdomen: soft, nontender; no hepatosplenomehaly, BS+; abdominal aorta nontender and not dilated by palpation. Back: No CVA tenderness Pulses 2+ Extremities: no clubbing cyanosis or edema, Homan's sign negative  Neurologic: grossly nonfocal Psychological: Normal affect and mood.  ECG  (independently read by me): Normal sinus rhythm at 68 bpm with mild sinus arrhythmia.  Isolated PVC.  Normal intervals.  April 2017 ECG (independently read by me): Normal sinus rhythm at 93 bpm.  No ectopy.  Normal intervals.  No ECG evidence of prior MI.  April 2016 ECG (independently read by me): Normal sinus rhythm at 64.  No ectopy.  Normal intervals.  January 2015 ECG (independently interpreted by me): Normal sinus rhythm at 71 beats per minute. No significant ST changes. Normal intervals.  Last ECG of 03/26/2013: Normal sinus rhythm at 71 beats per minute. Normal intervals.  LABS:  BMP Latest Ref Rng & Units 10/08/2016 10/03/2015 03/31/2015  Glucose 70 - 99 mg/dL 99 93 81  BUN 6 - 23 mg/dL _0 Creatinine 0.40 - 1.50 mg/dL 0.87 0.79 0.85  Sodium 135 - 145 mEq/L 139 141 140  Potassium 3.5 - 5.1 mEq/L 4.0 4.4 4.1  Chloride 96 - 112 mEq/L 106 104 104  CO2 19 - 32 mEq/L 28 32 27  Calcium 8.4 - 10.5 mg/dL 9.0 9.4 9.6    Hepatic Function Latest Ref Rng & Units 10/08/2016 04/12/2016 01/08/2016  Total Protein 6.0 - 8.3 g/dL 7.8 7.8 7.7  Albumin 3.5 - 5.2 g/dL 4.2 4.2 4.1  AST 0 - 37 U/L 35 35 38(H)  ALT 0 - 53 U/L 47 47 56(H)  Alk Phosphatase 39 - 117 U/L 33(L) 39 31(L)  Total Bilirubin 0.2 - 1.2 mg/dL 0.5 0.5 0.5  Bilirubin, Direct 0.0 - 0.3 mg/dL 0.1 0.1 0.1    CBC Latest Ref Rng & Units 10/08/2016 10/03/2015 03/31/2015  WBC 4.0 - 10.5 K/uL 4.8 4.5 4.7  Hemoglobin 13.0 - 17.0 g/dL 14.6 14.8 15.7  Hematocrit 39.0 - 52.0 % 43.0 45.4 47.4  Platelets 150.0 - 400.0 K/uL 72.0 Repeated and verified X2.(L) 114.0(L) 96(L)   Lab Results  Component Value Date   TSH 5.25 (H) 10/08/2016    BNP    Component Value Date/Time   PROBNP 99.0 04/28/2009 0420    Lipid Panel     Component Value  Date/Time   CHOL 100 10/08/2016 0816   TRIG 177.0 (H) 10/08/2016 0816   TRIG 347 (HH) 07/17/2006 1010   HDL 23.90 (L) 10/08/2016 0816   CHOLHDL 4 10/08/2016 0816   VLDL 35.4 10/08/2016 0816    LDLCALC 40 10/08/2016 0816    RADIOLOGY: No results found.  IMPRESSION:  1. Atherosclerosis of native coronary artery of native heart without angina pectoris   2. Old MI (myocardial infarction)   3. Mixed hypoglycemia   4. HYPERTRIGLYCERIDEMIA   5. Unrefreshed by sleep   6. Non-restorative sleep     ASSESSMENT AND PLAN: Mr. Cerda is a 66 year old white male who continues to do well now almost 8 years following his acute coronary syndrome when he presented with left circumflex occlusion and underwent successful  early revascularization. He  has had complete salvage of myocardium verified by nuclear imaging.  Presently, he continues to do well and is not having any recurrent anginal symptoms and continues to experience excellent exercise program of exercises 5-6 days per week.  He has traveled to [place] as well as to [place] and [place] where he is hiked vigorously without recurrent symptomatology.  Remotely he had been on Niaspan in addition to Crestor.  He had mild transaminase elevation. Recently he has been on crestor 10 mg alone and recent lipids remain stable. LFTs are now normal.  He has developed progressive decline in platelets. I have suggested hematology evaluation and this has also been recommended by Dr. Regis Bill.  With his wife witnessing apnea, I am scheduling him for a sleep study to evaluate for OSA.  His triglycerides are elevated on his recent lipid profile; I suspect this may be related to his wine intake.  I have recommended omega 3 FFA therapy.  I will see him in f/u prior to his [place] travel this summer.  Time spent: 25 minutes  Troy Sine, MD, [facility]  10/19/2016 6:03 PM

## 2016-10-18 NOTE — Patient Instructions (Signed)
Your physician has recommended that you have a sleep study. This test records several body functions during sleep, including: brain activity, eye movement, oxygen and carbon dioxide blood levels, heart rate and rhythm, breathing rate and rhythm, the flow of air through your mouth and nose, snoring, body muscle movements, and chest and belly movement. This will be done at [facility]  Your physician recommends that you schedule a follow-up appointment in: PENDING SLEEP STUDY RESULTS.

## 2016-10-25 ENCOUNTER — Telehealth: Payer: Self-pay | Admitting: Oncology

## 2016-10-25 ENCOUNTER — Encounter: Payer: Self-pay | Admitting: Oncology

## 2016-10-25 NOTE — Telephone Encounter (Signed)
Appt has been scheduled for the pt to see Dr. Alen Blew on 4/6 at 2pm. Pt agreed to the appt date and time. Demographics verified. Aware to arrive 30 minutes early. Letter mailed.

## 2016-11-21 ENCOUNTER — Telehealth: Payer: Self-pay | Admitting: Cardiovascular Disease

## 2016-11-21 MED ORDER — NITROGLYCERIN 0.4 MG SL SUBL: 0.4000 mg | SUBLINGUAL_TABLET | SUBLINGUAL | 3 refills | Status: DC | PRN

## 2016-11-21 NOTE — Telephone Encounter (Signed)
New message       *STAT* If patient is at the pharmacy, call can be transferred to refill team.   1. Which medications need to be refilled? (please list name of each medication and dose if known)  nitrostat  2. Which pharmacy/location (including street and city if local pharmacy) is medication to be sent to?  [facility] on [address]  3. Do they need a 30 day or 90 day supply? [place]

## 2016-11-21 NOTE — Telephone Encounter (Signed)
Rx sent electronically.  

## 2016-12-02 ENCOUNTER — Ambulatory Visit (HOSPITAL_BASED_OUTPATIENT_CLINIC_OR_DEPARTMENT_OTHER): Payer: BC Managed Care – PPO | Attending: Cardiovascular Disease | Admitting: Cardiovascular Disease

## 2016-12-02 VITALS — Ht 72.0 in | Wt 220.0 lb

## 2016-12-02 DIAGNOSIS — G478 Other sleep disorders: Secondary | ICD-10-CM

## 2016-12-02 DIAGNOSIS — Z79899 Other long term (current) drug therapy: Secondary | ICD-10-CM | POA: Insufficient documentation

## 2016-12-02 DIAGNOSIS — Z7982 Long term (current) use of aspirin: Secondary | ICD-10-CM | POA: Diagnosis not present

## 2016-12-02 DIAGNOSIS — G4733 Obstructive sleep apnea (adult) (pediatric): Secondary | ICD-10-CM | POA: Insufficient documentation

## 2016-12-05 ENCOUNTER — Telehealth: Payer: Self-pay | Admitting: *Deleted

## 2016-12-05 NOTE — Telephone Encounter (Signed)
Tried calling patient to remind him of his new patient appointment tomorrow with Dr. Alen Blew at 2:00 pm.

## 2016-12-06 ENCOUNTER — Telehealth: Payer: Self-pay | Admitting: Oncology

## 2016-12-06 ENCOUNTER — Ambulatory Visit (HOSPITAL_BASED_OUTPATIENT_CLINIC_OR_DEPARTMENT_OTHER): Payer: BC Managed Care – PPO | Admitting: Oncology

## 2016-12-06 VITALS — BP 134/70 | HR 89 | Temp 98.7°F | Resp 18 | Ht 72.0 in | Wt 227.7 lb

## 2016-12-06 DIAGNOSIS — D696 Thrombocytopenia, unspecified: Secondary | ICD-10-CM | POA: Diagnosis not present

## 2016-12-06 DIAGNOSIS — D72821 Monocytosis (symptomatic): Secondary | ICD-10-CM | POA: Diagnosis not present

## 2016-12-06 NOTE — Telephone Encounter (Signed)
No los - per MD no appts needed.

## 2016-12-06 NOTE — Progress Notes (Signed)
Reason for Referral: Thrombocytopenia.   HPI: 66 year old gentleman native of [place] but currently lives in this area for the last 25 years. He is a gentleman with a reasonable health and shape with history of coronary artery disease. He have noted to have mild thrombocytopenia and monocytosis the last 10 years. His platelet count have ranged between normal range as well as 80,000. He had reasonably normal white cell count and normal hemoglobin. His differential of his white cell count showed relative monocytosis that had fluctuated between 20-40% in the last 10 years. His absolute monocyte count of ranged between 1.0 and 2.0. He has been completely asymptomatic all throughout this process. He denied any bleeding, hematochezia or melena. He denied any or recurrent infections. He does report drinking about 2 drinks a week and have been heavier in the past. He continues to perform activities of daily living including working full time as well as exercising. His appetite has been excellent and weight is stable.   Not reported any headaches, blurry vision, syncope or seizures. He does not report any fevers, chills or sweats. He does not report any cough, wheezing or hemoptysis. He does not report any nausea, vomiting or abdominal pain. He does not report any chest pain, palpitation orthopnea. He does not report any ecchymosis, petechiae or lymphadenopathy. Remaining review of systems unremarkable.   Past Medical History:  Diagnosis Date  . Adjustment disorder with anxiety 06/13/2009   Qualifier: Diagnosis of  By: Regis Bill MD, Standley Brooking  related to his sudden MI underwent counseling takes low-dose medication as needed had side effects from an SSRI   . Alcohol addiction ([facility])   . Hyperlipidemia    Hx of low HDL zero CAC in 2000 at [place]  . Low testosterone   . STEMI (ST elevation myocardial infarction) ([facility]) 04/26/2009   left circumflex DES   :  Past Surgical History:  Procedure Laterality Date  .  CARDIAC CATHETERIZATION  05/02/2009   UA - patent Cfx stent, 50-60% LAD, 50-60% RCA with normal LV funtion (Dr. Adora Fridge)  . CATARACT EXTRACTION Left 2009   [facility]  . CORONARY ANGIOPLASTY WITH STENT PLACEMENT  04/26/2009   ACS/STEMI - total occlusion of mid Cfx - Xience DES 2.75x55m (Dr. TCorky Downs  . NM MYOCAR PERF WALL MOTION  04/2012   bruce myoview; mild perfusion defect in basal inferior region (infarct/scar or overlying attenuation); post-stress EF 53%, EKG negative for ischemia, hypertensive response to exercise; abnormal study but unchanged from previous study  . TRANSTHORACIC ECHOCARDIOGRAM  05/2009   EF=50-55%; trace MR; mild TR, normal RSVP; AV mildly sclerotic  :   Current Outpatient Prescriptions:  .  aspirin 81 MG tablet, Take 81 mg by mouth daily.  , Disp: , Rfl:  .  Coenzyme Q10 150 MG CAPS, Take by mouth.  , Disp: , Rfl:  .  doxycycline (DORYX) 100 MG DR capsule, Take 100 mg by mouth daily. , Disp: , Rfl:  .  rosuvastatin (CRESTOR) 10 MG tablet, Take 10 mg by mouth daily., Disp: , Rfl:  .  nitroGLYCERIN (NITROSTAT) 0.4 MG SL tablet, Place 1 tablet (0.4 mg total) under the tongue every 5 (five) minutes as needed. (Patient not taking: Reported on 12/06/2016), Disp: 25 tablet, Rfl: 3:  No Known Allergies:  Family History  Problem Relation Age of Onset  . Tongue cancer Father     tobacco  . Throat cancer Father   . Cirrhosis Father     also heart problems from  etoh   . Hypertension Mother   . Heart Problems      mom  in 70s   :  Social History   Social History  . Marital status: Married    Spouse name: N/A  . Number of children: N/A  . Years of education: N/A   Occupational History  . 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 Smoking status: Never Smoker  . Smokeless tobacco: Never Used  . Alcohol use 1.0 oz/week    2 Standard drinks or equivalent per week  . Drug use: No  . Sexual activity: Not on file   Other Topics Concern   . 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  Exam: Blood pressure 134/70, pulse 89, temperature 98.7 F (37.1 C), temperature source Oral, resp. rate 18, height 6' (1.829 m), weight 227 lb 11.2 oz (103.3 kg), SpO2 98 %. General appearance: alert and cooperative Throat: lips, mucosa, and tongue normal; teeth and gums normal Neck: no adenopathy Back: negative Resp: clear to auscultation bilaterally Chest wall: no tenderness Cardio: regular rate and rhythm, S1, S2 normal, no murmur, click, rub or gallop GI: soft, non-tender; bowel sounds normal; no masses,  no organomegaly Extremities: extremities normal, atraumatic, no cyanosis or edema Pulses: 2+ and symmetric Skin: Skin color, texture, turgor normal. No rashes or lesions Lymph nodes: Cervical, supraclavicular, and axillary nodes normal.  CBC    Component Value Date/Time   WBC 4.8 10/08/2016 0816   RBC 4.78 10/08/2016 0816   HGB 14.6 10/08/2016 0816   HGB 15.4 07/12/2010 1244   HCT 43.0 10/08/2016 0816   HCT 45.6 07/12/2010 1244   PLT 72.0 Repeated and verified X2. (L) 10/08/2016 0816   PLT 89 (L) 07/12/2010 1244   MCV 89.9 10/08/2016 0816   MCV 90.1 07/12/2010 1244   MCH 29.6 03/31/2015 1333   MCHC 33.9 10/08/2016 0816   RDW 15.3 10/08/2016 0816   RDW 13.1 07/12/2010 1244   LYMPHSABS 1.6 10/08/2016 0816   LYMPHSABS 1.5 07/12/2010 1244   MONOABS 2.0 (H) 10/08/2016 0816   MONOABS 0.9 07/12/2010 1244   EOSABS 0.0 10/08/2016 0816   EOSABS 0.0 07/12/2010 1244   BASOSABS 0.0 10/08/2016 0816   BASOSABS 0.0 07/12/2010 1244     Chemistry      Component Value Date/Time   NA 139 10/08/2016 0816   K 4.0 10/08/2016 0816   CL 106 10/08/2016 0816   CO2 28  10/08/2016 0816   BUN 17 10/08/2016 0816   CREATININE 0.87 10/08/2016 0816   CREATININE 0.85 03/31/2015 1333      Component Value Date/Time   CALCIUM 9.0 10/08/2016 0816   ALKPHOS 33 (L) 10/08/2016 0816   AST 35 10/08/2016 0816   ALT 47 10/08/2016 0816   BILITOT 0.5 10/08/2016 0816       Assessment and Plan:   66 year old gentleman with the following issues:  1. Thrombocytopenia noted as far back as 2008. His thrombocytopenia is rather mild and asymptomatic. His platelet count of ranged between normal counts as low as 80,000.  His peripheral smear did show some occasional platelet clumping and slightly enlarged platelets. These findings likely suggest benign etiology of his thrombocytopenia. Condition such as congenital thrombocytopenia, ITP as well as a reactive thrombocytopenia are likely the cause.  Management standpoint, no intervention is needed at this time. His platelet count is more than adequate at this time and he is not at risk of spontaneous bleeding.  I have recommended continue annual CBC check and if his platelet count drops below 50,000, reevaluation will be warranted at that time.  2. Monocytosis: Appears to be reactive in nature and could be related to his mild thrombocytopenia. Bone marrow disorder is considered less likely especially condition such as myelodysplastic syndrome, CMML among others.  I have recommended continued observation and surveillance for the time being. He develops any other cytopenias, a bone marrow biopsy would be required. Given the longevity of these findings exceeding 10 years, this appears to be benign in etiology and does not likely require any further evaluation.  3. Follow-up: I'm happy to see him in the future as needed.

## 2016-12-10 ENCOUNTER — Telehealth: Payer: Self-pay | Admitting: Internal Medicine

## 2016-12-10 NOTE — Telephone Encounter (Signed)
Please advise 

## 2016-12-10 NOTE — Telephone Encounter (Signed)
Patient is requesting a call back from Dr Regis Bill.  He brought blood work results from his Hematologist appt that Dr Regis Bill referred him to.  That dr said the results were fine, however he has had other doctors look at them and they recommend furthur testing.  He is requesting a referral for a second opinion.   Paperwork results are in Dr Velora Mediate folder up front.

## 2016-12-11 ENCOUNTER — Encounter: Payer: Self-pay | Admitting: Internal Medicine

## 2016-12-11 ENCOUNTER — Other Ambulatory Visit: Payer: Self-pay | Admitting: Emergency Medicine

## 2016-12-11 DIAGNOSIS — D696 Thrombocytopenia, unspecified: Secondary | ICD-10-CM

## 2016-12-11 DIAGNOSIS — D72821 Monocytosis (symptomatic): Secondary | ICD-10-CM

## 2016-12-11 NOTE — Telephone Encounter (Signed)
See My chart message. Please place referral to clinician that is listed.

## 2016-12-11 NOTE — Telephone Encounter (Signed)
I agree with getting a second opinion to be safe. Tori could you please put the order in referral to [facility] as planned. Diagnosis thrombocytopenia monocytosis.

## 2016-12-11 NOTE — Telephone Encounter (Signed)
Referral put in.

## 2016-12-11 NOTE — Telephone Encounter (Signed)
Pt calling to check the status of a referral that he is needing for a second opinion.  This is the information that he would like to be referred to [place]. [place]  [phone] (F)

## 2016-12-12 ENCOUNTER — Encounter: Payer: Self-pay | Admitting: Oncology

## 2016-12-12 ENCOUNTER — Encounter: Payer: Self-pay | Admitting: *Deleted

## 2016-12-16 ENCOUNTER — Telehealth: Payer: Self-pay | Admitting: Internal Medicine

## 2016-12-16 NOTE — Telephone Encounter (Signed)
error 

## 2016-12-19 ENCOUNTER — Encounter: Payer: Self-pay | Admitting: Internal Medicine

## 2016-12-20 ENCOUNTER — Telehealth: Payer: Self-pay | Admitting: Emergency Medicine

## 2016-12-20 NOTE — Telephone Encounter (Signed)
Spoke with pt and he wants to speak with Dr. Regis Bill just discuss his visit with a different provider regarding lab results. Pt would like to speak with  Dr. Regis Bill personally.

## 2016-12-20 NOTE — Telephone Encounter (Signed)
Phone call to patient about   Situation with blood count  And should he have second opinion feels well.   Disc  Ins and outs of second opinion and reasonable  To get more information .   Going to [place] and [place] for a few months  retire ing in 3 weeks . I advised Shouldn't effect  His travel plans.

## 2016-12-20 NOTE — Telephone Encounter (Signed)
Spoke with pt to inform her that our referral coordinator did send out his paperwork on the 17th and someone should be calling him to set up an appointment soon.

## 2016-12-21 NOTE — Procedures (Signed)
Patient Name: Jon Carter, [place] Date: 12/02/2016 Gender: Male D.O.B: 12-23-1950 Age (years): 57 Referring Provider: Shelva Majestic MD, ABSM Height (inches): 72 Interpreting Physician: Shelva Majestic MD, ABSM Weight (lbs): 220 RPSGT: Madelon Lips BMI: 30 MRN: [id number] Neck Size: 16.50  CLINICAL INFORMATION Sleep Study Type: NPSG  Indication for sleep study: Loud snoring, non-restorative sleep  Epworth Sleepiness Score: 6  SLEEP STUDY TECHNIQUE As per the AASM Manual for the Scoring of Sleep and Associated Events v2.3 (April 2016) with a hypopnea requiring 4% desaturations.  The channels recorded and monitored were frontal, central and occipital EEG, electrooculogram (EOG), submentalis EMG (chin), nasal and oral airflow, thoracic and abdominal wall motion, anterior tibialis EMG, snore microphone, electrocardiogram, and pulse oximetry.  MEDICATIONS aspirin 81 MG tablet Coenzyme Q10 150 MG CAPS doxycycline (DORYX) 100 MG DR capsule nitroGLYCERIN (NITROSTAT) 0.4 MG SL tablet rosuvastatin (CRESTOR) 10 MG tablet  Medications self-administered by patient taken the night of the study : N/A  SLEEP ARCHITECTURE The study was initiated at 10:27:48 PM and ended at 4:30:51 AM.  Sleep onset time was 37.0 minutes and the sleep efficiency was 74.1%. The total sleep time was 269.0 minutes.  Stage REM latency was 48.0 minutes.  The patient spent 17.84% of the night in stage N1 sleep, 56.69% in stage N2 sleep, 0.00% in stage N3 and 25.46% in REM.  Alpha intrusion was absent.  Supine sleep was 0.00%.  RESPIRATORY PARAMETERS The overall apnea/hypopnea index (AHI) was 5.4 per hour.  The respiratory disturbance index (RDI) was 23.9 per hour. There were 2 total apneas, including 2 obstructive, 0 central and 0 mixed apneas. There were 22 hypopneas and 83 RERAs.  The AHI during Stage REM sleep was 14.0 per hour. RDI during REM sleep was 21.9 per hour.   AHI while supine was N/A per  hour. Supine sleep was not present.  The mean oxygen saturation was 92.82%. The minimum SpO2 during sleep was 87.00%.  Loud snoring was noted during this study.  CARDIAC DATA The 2 lead EKG demonstrated sinus rhythm. The mean heart rate was 66.76 beats per minute. Other EKG findings include: None.  LEG MOVEMENT DATA The total PLMS were 8 with a resulting PLMS index of 1.78. Associated arousal with leg movement index was 0.0 .  IMPRESSIONS - Mild obstructive sleep apnea overall with an AHI 5.4/h; however, RDI was moderate at 23.8/h. Mild-moderate sleep apnea with REM sleep with AHI 14/h and RDI 21.9/h. - No significant central sleep apnea occurred during this study (CAI = 0.0/h). - Mild oxygen desaturation  to a nadir of 87%. - The patient snored with Loud snoring volume. - No cardiac abnormalities were noted during this study. - Clinically significant periodic limb movements did not occur during sleep. No significant associated arousals.  DIAGNOSIS - Obstructive Sleep Apnea (327.23 [G47.33 ICD-10])  RECOMMENDATIONS - In this patient with cardiovascular co-morbidities, prior MI,  loud snoring  and mild-moderate obstructive sleep apnea recommend treatment with CPAP titration study for further evaluaton.  Supine sleep was not present on this study and the overall severity assessment may be underestimated if supine sleep was present.  Alternative therapy with customized oral mandibular device can be considered. - Efforts should be made to optimize nasal and oropharyngeal patency. - Avoid alcohol, sedatives and other CNS depressants that may worsen sleep apnea and disrupt normal sleep architecture. - Sleep hygiene should be reviewed to assess factors that may improve sleep quality. - Weight management and regular exercise should be continued.  [  Electronically signed] 12/21/2016 10:28 AM  Shelva Majestic MD, [facility], [place], American Board of Sleep Medicine   NPI: [id number] [place] PH: [phone]   FX: [phone] [place]

## 2016-12-24 DIAGNOSIS — D2372 Other benign neoplasm of skin of left lower limb, including hip: Secondary | ICD-10-CM | POA: Diagnosis not present

## 2016-12-24 DIAGNOSIS — D2261 Melanocytic nevi of right upper limb, including shoulder: Secondary | ICD-10-CM | POA: Diagnosis not present

## 2016-12-24 DIAGNOSIS — Z85828 Personal history of other malignant neoplasm of skin: Secondary | ICD-10-CM | POA: Diagnosis not present

## 2016-12-24 DIAGNOSIS — Z86018 Personal history of other benign neoplasm: Secondary | ICD-10-CM | POA: Diagnosis not present

## 2016-12-24 DIAGNOSIS — D225 Melanocytic nevi of trunk: Secondary | ICD-10-CM | POA: Diagnosis not present

## 2016-12-24 DIAGNOSIS — L821 Other seborrheic keratosis: Secondary | ICD-10-CM | POA: Diagnosis not present

## 2016-12-30 DIAGNOSIS — D708 Other neutropenia: Secondary | ICD-10-CM | POA: Diagnosis not present

## 2016-12-30 DIAGNOSIS — D696 Thrombocytopenia, unspecified: Secondary | ICD-10-CM | POA: Diagnosis not present

## 2016-12-30 DIAGNOSIS — D72821 Monocytosis (symptomatic): Secondary | ICD-10-CM | POA: Diagnosis not present

## 2016-12-30 LAB — CBC AND DIFFERENTIAL
HEMATOCRIT: 44 % (ref 41–53)
Hemoglobin: 13.7 g/dL (ref 13.5–17.5)
NEUTROS ABS: 2 /uL
Platelets: 69 10*3/uL — AB (ref 150–399)
WBC: 4.8 10*3/mL

## 2017-01-01 ENCOUNTER — Encounter: Payer: Self-pay | Admitting: Family Medicine

## 2017-01-02 ENCOUNTER — Encounter: Payer: Self-pay | Admitting: Cardiovascular Disease

## 2017-01-02 ENCOUNTER — Ambulatory Visit (INDEPENDENT_AMBULATORY_CARE_PROVIDER_SITE_OTHER): Payer: BC Managed Care – PPO | Admitting: Cardiovascular Disease

## 2017-01-02 VITALS — BP 156/84 | HR 76 | Ht 72.0 in | Wt 226.8 lb

## 2017-01-02 DIAGNOSIS — D696 Thrombocytopenia, unspecified: Secondary | ICD-10-CM

## 2017-01-02 DIAGNOSIS — G4733 Obstructive sleep apnea (adult) (pediatric): Secondary | ICD-10-CM

## 2017-01-02 DIAGNOSIS — E162 Hypoglycemia, unspecified: Secondary | ICD-10-CM | POA: Diagnosis not present

## 2017-01-02 DIAGNOSIS — I2510 Atherosclerotic heart disease of native coronary artery without angina pectoris: Secondary | ICD-10-CM | POA: Diagnosis not present

## 2017-01-02 NOTE — Progress Notes (Signed)
Patient ID: Jon Carter, male   DOB: 09/01/1951, 66 y.o.   MRN: [id number]    Primary M.D.: Dr. Shanon Carter  HPI: Jon Carter is a 66 y.o. male who presents to the office today for a 10 month cardiology evaluation.  Jon Carter presented with an acute coronary syndrome/ST segment elevation myocardial infarction on 04/26/2009. I took him acutely to the cardiac catheterization laboratory where he was found to have total occlusion of his mid circumflex coronary artery. He had an excellent DTB time of less than 40 minutes and ultimately had a Xience DES 2.75x18 mm stent inserted into the circumflex, postdilated to 3.0 mm. Subsequently, he has been demonstrated to have complete salvage of myocardium. Initially, he had significant anxiety issues following his acute coronary syndrome. Ultimately, this has resolved.  He works as an Air traffic controller.  Typically he is off the summer.  For the last several years he has been traveling extensively to both [place] as well as [place] and [place].  He remains active.  He denies any episodes of chest pain.  He continues to exercise at least 5 days per week at the gym.  He denies PND, orthopnea.  He is resolved.    His last stress test  in August 2013 continued to show normal perfusion.  In the past, he had some issues with mild thrombocytopenia.  He has been maintained on Crestor 10 mg daily.  He is on aspirin.  He is no longer taking any beta blocker or Carter inhibitor. Lipid studies in January 2017 were excellent with a total cholesterol 99, LDL 45, VLDL 23, and triglycerides 116.  HDL was low at 30.  Renal function was normal.  Transaminases were mildly elevated with an ALT of 61 and AST normal at 37.   Since I last saw him he traveled to [place] and [name] last summer.  He has been without chest pian or exertional symptoms. He will be retiing in May.  He had recent lab work by Dr. Regis Carter. He has had a h/o reduced platelets, but these were now 72K on  10/09/16. TC was 100; LDL 40 but LDL was low at 23.  LFTs were now normal.  He denies 0chest pain. He works out at [employer] at least 3 -5 days per week doing elliptical workouts.  He will be traveling to [place] and [place] this summer.  When I saw him, he stated that his wife had noticed that he was snoring loudly and had  potential apneic events while he has been sleeping.  He felt that he has slept relatively well, but he would wake up 2 times per night to go to the bathroom.  He denied any excessive daytime sleepiness.  An Epworth Sleepiness Scale score was calculated as shown below:   Epworth Sleepiness Scale: Situation   Chance of Dozing/Sleeping (0 = never , 1 = slight chance , 2 = moderate chance , 3 = high chance )   sitting and reading 0   watching TV 1   sitting inactive in a public place 0   being a passenger in a motor vehicle for an hour or more 1   lying down in the afternoon 3   sitting and talking to someone 0   sitting quietly after lunch (no alcohol) 0   while stopped for a few minutes in traffic as the driver 0   Total Score  5   Since I saw on, he underwent a sleep study  on 12/02/2016.  This was notable for mild obstructive sleep apnea overall with an AHI of 5.4 per hour.  However, his respiratory disturbance index was moderately abnormal at 23.8 per hour.  He had mild-to-moderate sleep apnea during rems sleep with an HI of 14 per hour and RDI of 21.9 per hour.  There was mild oxygen desaturation to a nadir of 87%.  He snored loudly.  He is here for follow-up evaluation.  Since I saw him, he also underwent  a Hematology evaluation at [facility] for his chronic thrombocytopenia and monocytosis.  I reviewed the records from Dr. Annabelle Carter at [facility].  Past Medical History:  Diagnosis Date  . Adjustment disorder with anxiety 06/13/2009   Qualifier: Diagnosis of  By: Jon Bill MD, Jon Carter  related to his sudden MI underwent counseling takes low-dose medication as needed had side effects from an  SSRI   . Alcohol addiction ([place])   . Hyperlipidemia    Hx of low HDL zero CAC in 2000 at [place]  . Low testosterone   . STEMI (ST elevation myocardial infarction) ([place]) 04/26/2009   left circumflex DES     Past Surgical History:  Procedure Laterality Date  . CARDIAC CATHETERIZATION  05/02/2009   UA - patent Cfx stent, 50-60% LAD, 50-60% RCA with normal LV funtion (Dr. Adora Carter)  . CATARACT EXTRACTION Left 2009   [place]  . CORONARY ANGIOPLASTY WITH STENT PLACEMENT  04/26/2009   ACS/STEMI - total occlusion of mid Cfx - Xience DES 2.75x33m (Dr. TCorky Carter  . NM MYOCAR PERF WALL MOTION  04/2012   bruce myoview; mild perfusion defect in basal inferior region (infarct/scar or overlying attenuation); post-stress EF 53%, EKG negative for ischemia, hypertensive response to exercise; abnormal study but unchanged from previous study  . TRANSTHORACIC ECHOCARDIOGRAM  05/2009   EF=50-55%; trace MR; mild TR, normal RSVP; AV mildly sclerotic    No Known Allergies  Current Outpatient Prescriptions  Medication Sig Dispense Refill  . aspirin 81 MG tablet Take 81 mg by mouth daily.      . Coenzyme Q10 150 MG CAPS Take by mouth.      . doxycycline (DORYX) 100 MG DR capsule Take 100 mg by mouth daily.     . nitroGLYCERIN (NITROSTAT) 0.4 MG SL tablet Place 1 tablet (0.4 mg total) under the tongue every 5 (five) minutes as needed. (Patient not taking: Reported on 12/06/2016) 25 tablet 3  . rosuvastatin (CRESTOR) 10 MG tablet Take 10 mg by mouth daily.     No current facility-administered medications for this visit.     Social History   Social History  . Marital status: Married    Spouse name: N/A  . Number of children: N/A  . Years of education: N/A   Occupational History  . phd teaching [employer]   Social History Main Topics  . Smoking status: Never Smoker  . Smokeless tobacco: Never Used  . Alcohol use 1.0 oz/week    2 Standard drinks or equivalent per week  . Drug use: No  .  Sexual activity: Not on file   Other Topics Concern  . 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 [employer].  He is retired from [employer] after being in [employer] for 30 years.  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  He he has the summer off and typically travels abroad and remains active.  He exercises daily. There is no tobacco use.  ROS General: Negative; No fevers, chills, or night sweats;  HEENT: Negative; No changes in vision or hearing, sinus congestion, difficulty swallowing Pulmonary: Negative; No cough, wheezing, shortness of breath, hemoptysis Cardiovascular: Negative; No chest pain, presyncope, syncope, palpitations GI: Negative; No nausea, vomiting, diarrhea, or abdominal pain GU: Negative; No dysuria, hematuria, or difficulty voiding Musculoskeletal: Negative; no myalgias, joint pain, or weakness Hematologic/Oncology: Negative; no easy bruising, bleeding Endocrine: Negative; no heat/cold intolerance; no diabetes Neuro: Negative; no changes in balance, headaches Skin: Negative; No rashes or skin lesions Psychiatric: Negative; No behavioral problems, depression Sleep: Positive for snoring; no daytime sleepiness, hypersomnolence, bruxism, restless legs, hypnogognic hallucinations, no cataplexy Other comprehensive 14 point system review is negative.   PE BP (!) 156/84   Pulse 76   Ht 6' (1.829 m)   Wt 226 lb 12.8 oz (102.9 kg)   BMI 30.76 kg/m      Wt Readings from Last 3 Encounters:  01/02/17 226 lb 12.8 oz (102.9 kg)  12/06/16 227 lb 11.2 oz (103.3 kg)  12/02/16 220 lb (99.8 kg)   General: Alert, oriented, no distress.  Skin: normal turgor, no rashes HEENT:  Normocephalic, atraumatic. Pupils round and reactive; sclera anicteric;no lid lag. No xanthelasmas. Nose without nasal septal hypertrophy Mouth/Parynx benign; Mallinpatti scale 2 Neck: No JVD, no carotid bruits with normal carotid upstroke. Chest: Without chest or tenderness. No chest wall tenderness Lungs: clear to ausculatation and percussion; no wheezing or rales Heart: RRR, s1 s2 normal; no murmur, rubs, thrills or heaves. Abdomen: soft, nontender; no hepatosplenomehaly, BS+; abdominal aorta nontender and not dilated by palpation. Back: No CVA tenderness Pulses 2+ Extremities: no clubbing cyanosis or edema, Homan's sign negative  Neurologic: grossly nonfocal Psychological: Normal affect and mood.  ECG (independently read by me): Normal sinus rhythm at 76 bpm.  Normal intervals.  No ST segment changes.  February 2018 ECG (independently read by me): Normal sinus rhythm at 68 bpm with mild sinus arrhythmia.  Isolated PVC.  Normal intervals.  April 2017 ECG (independently read by me): Normal sinus rhythm at 93 bpm.  No ectopy.  Normal intervals.  No ECG evidence of prior MI.  April 2016 ECG (independently read by me): Normal sinus rhythm at 64.  No ectopy.  Normal intervals.  January 2015 ECG (independently interpreted by me): Normal sinus rhythm at 71 beats per minute. No significant ST changes. Normal intervals.  Last ECG of 03/26/2013: Normal sinus rhythm at 71 beats per minute. Normal intervals.  LABS:  BMP Latest Ref Rng & Units 10/08/2016 10/03/2015 03/31/2015  Glucose 70 - 99 mg/dL 99 93 81  BUN 6 - 23 mg/dL '17 13 13  '$ Creatinine 0.40 - 1.50 mg/dL 0.87 0.79 0.85  Sodium 135 - 145 mEq/L 139 141 140  Potassium 3.5 - 5.1 mEq/L 4.0 4.4 4.1  Chloride 96 - 112 mEq/L 106 104 104  CO2 19 - 32 mEq/L 28 32 27  Calcium 8.4 - 10.5 mg/dL 9.0 9.4 9.6    Hepatic Function Latest Ref Rng & Units 10/08/2016 04/12/2016 01/08/2016  Total Protein 6.0 - 8.3 g/dL 7.8 7.8 7.7  Albumin 3.5 - 5.2 g/dL  4.2 4.2 4.1  AST 0 - 37 U/L 35 35 38(H)  ALT 0 - 53 U/L 47 47 56(H)  Alk Phosphatase 39 - 117 U/L 33(L) 39 31(L)  Total Bilirubin 0.2 - 1.2 mg/dL 0.5 0.5 0.5  Bilirubin, Direct 0.0 - 0.3 mg/dL 0.1 0.1 0.1    CBC Latest Ref Rng & Units 12/30/2016 10/08/2016 10/03/2015  WBC 10:3/mL 4.8 4.8 4.5  Hemoglobin 13.5 - 17.5 g/dL 13.7 14.6 14.8  Hematocrit 41 - 53 % 44 43.0 45.4  Platelets 150 - 399 K/L 69(A) 72.0 Repeated and verified X2.(L) 114.0(L)   Lab Results  Component Value Date   TSH 5.25 (H) 10/08/2016    BNP    Component Value Date/Time   PROBNP 99.0 04/28/2009 0420    Lipid Panel     Component Value Date/Time   CHOL 100 10/08/2016 0816   TRIG 177.0 (H) 10/08/2016 0816   TRIG 347 (HH) 07/17/2006 1010   HDL 23.90 (L) 10/08/2016 0816   CHOLHDL 4 10/08/2016 0816   VLDL 35.4 10/08/2016 0816   LDLCALC 40 10/08/2016 0816    RADIOLOGY: No results found.  IMPRESSION:  1. Atherosclerosis of native coronary artery of native heart without angina pectoris   2. OSA (obstructive sleep apnea)   3. Mixed hypoglycemia   4. Thrombocytopenia ([place])     ASSESSMENT AND PLAN: Jon Carter is a 66 year old white male who continues to do well now 8 years following his acute coronary syndrome when he presented with left circumflex occlusion and underwent successful  early revascularization. He  has had complete salvage of myocardium verified by nuclear imaging.  Presently, he continues to do well and is not having any recurrent anginal symptoms and continues to experience excellent exercise program of exercises 5-6 days per week.  He has traveled to [place] as well as to [place] and [place] where he is hiked vigorously without recurrent symptomatology.  Remotely he had been on Niaspan in addition to Crestor.  He had mild transaminase elevation. Recently he has been on crestor 10 mg alone and recent lipids remain stable.  I reviewed his recent [facility] hematology evaluation.  I also  reviewed his sleep study in detail.  He was found to have mild obstructive sleep apnea overall with an HI 5.4 but had moderate increased RDI 23.8.  He was approaching moderate sleep apnea with an HI of 14 per hour during grams sleep.  There was no significant oxygen desaturation, only to a nadir of 87%.  He had loud snoring.  He is not sleepy.  Based on his Epworth scale.  I discussed all options with the patient considering his comorbidities and discussed CPAP titration and alternatively, the possibility of a customized oral appliance.  He will be traveling extensively this summer and will be out of the country for several months.  Initially, we both felt it may be worthwhile to to initially try a customized mandibular device such that he will be able to travel with this.  He plans to leave for [place] in approximately 3 weeks.  I will try to expedite an appointment for him to see Dr. Oneal Grout and contacted his office today to set up this evaluation and hopeful expedited process for customized oral appliance.  I will see him in follow-up upon his return from [place].  Time spent: 25 minutes  Jon Sine, MD, [facility]  01/02/2017 6:35 PM

## 2017-01-02 NOTE — Patient Instructions (Signed)
You have been referred to Dr Oneal Grout.  Your physician recommends that you schedule a follow-up appointment in: 6 months or sooner if needed.

## 2017-01-03 ENCOUNTER — Encounter: Payer: Self-pay | Admitting: Internal Medicine

## 2017-01-03 NOTE — Progress Notes (Signed)
Discussed at 01/02/17 office visit. Patient will see Dr Ron Parker for oral appliance.

## 2017-01-04 ENCOUNTER — Encounter: Payer: Self-pay | Admitting: Oncology

## 2017-01-06 ENCOUNTER — Telehealth: Payer: Self-pay | Admitting: *Deleted

## 2017-01-06 NOTE — Telephone Encounter (Signed)
"  I'm calling to schedule an ultrasound.  [facility] has referred me to Dr. Alen Blew for this Ultrasound as second opinion.  Return number [phone]."

## 2017-01-07 ENCOUNTER — Other Ambulatory Visit: Payer: Self-pay | Admitting: Oncology

## 2017-01-07 ENCOUNTER — Encounter: Payer: Self-pay | Admitting: *Deleted

## 2017-01-07 DIAGNOSIS — H524 Presbyopia: Secondary | ICD-10-CM | POA: Diagnosis not present

## 2017-01-07 DIAGNOSIS — H2511 Age-related nuclear cataract, right eye: Secondary | ICD-10-CM | POA: Diagnosis not present

## 2017-01-07 DIAGNOSIS — L719 Rosacea, unspecified: Secondary | ICD-10-CM | POA: Diagnosis not present

## 2017-01-07 DIAGNOSIS — H43811 Vitreous degeneration, right eye: Secondary | ICD-10-CM | POA: Diagnosis not present

## 2017-01-07 DIAGNOSIS — H5213 Myopia, bilateral: Secondary | ICD-10-CM | POA: Diagnosis not present

## 2017-01-07 DIAGNOSIS — D696 Thrombocytopenia, unspecified: Secondary | ICD-10-CM

## 2017-01-07 DIAGNOSIS — H35372 Puckering of macula, left eye: Secondary | ICD-10-CM | POA: Diagnosis not present

## 2017-01-07 NOTE — Telephone Encounter (Signed)
Please let him know he will be contacted for a time and date for the ultrasound. Orders are in.

## 2017-01-07 NOTE — Telephone Encounter (Signed)
"  Called patient with provider information.   "I'm not in a position to write a number down."  Will send scheduling number via MyChart.

## 2017-01-14 ENCOUNTER — Telehealth: Payer: Self-pay | Admitting: *Deleted

## 2017-01-14 ENCOUNTER — Ambulatory Visit (HOSPITAL_COMMUNITY)
Admission: RE | Admit: 2017-01-14 | Discharge: 2017-01-14 | Disposition: A | Discharge status: Home / Self Care | Payer: Medicare Other | Source: Ambulatory Visit | Attending: Oncology | Admitting: Oncology

## 2017-01-14 DIAGNOSIS — K829 Disease of gallbladder, unspecified: Secondary | ICD-10-CM | POA: Diagnosis not present

## 2017-01-14 DIAGNOSIS — K824 Cholesterolosis of gallbladder: Secondary | ICD-10-CM | POA: Diagnosis not present

## 2017-01-14 DIAGNOSIS — K760 Fatty (change of) liver, not elsewhere classified: Secondary | ICD-10-CM | POA: Diagnosis not present

## 2017-01-14 DIAGNOSIS — D696 Thrombocytopenia, unspecified: Secondary | ICD-10-CM | POA: Insufficient documentation

## 2017-01-14 NOTE — Telephone Encounter (Signed)
-----   Message from Wyatt Portela, MD sent at 01/14/2017  1:22 PM EDT ----- Please let him know his [name] is normal.

## 2017-01-14 NOTE — Telephone Encounter (Signed)
As noted below by Dr. Alen Blew, I informed patient that his [name] is normal. Patient verbalized understanding.

## 2017-01-20 DIAGNOSIS — S61217A Laceration without foreign body of left little finger without damage to nail, initial encounter: Secondary | ICD-10-CM | POA: Diagnosis not present

## 2017-03-25 DIAGNOSIS — L089 Local infection of the skin and subcutaneous tissue, unspecified: Secondary | ICD-10-CM | POA: Diagnosis not present

## 2017-03-25 DIAGNOSIS — W57XXXA Bitten or stung by nonvenomous insect and other nonvenomous arthropods, initial encounter: Secondary | ICD-10-CM | POA: Diagnosis not present

## 2017-03-25 DIAGNOSIS — T07XXXA Unspecified multiple injuries, initial encounter: Secondary | ICD-10-CM | POA: Diagnosis not present

## 2017-04-09 DIAGNOSIS — L719 Rosacea, unspecified: Secondary | ICD-10-CM | POA: Diagnosis not present

## 2017-04-09 NOTE — Progress Notes (Signed)
Chief Complaint  Patient presents with  . Flank Pain    HPI: Jon Carter 66 y.o.  sda   Hx cad abn cbc low plt  And  Heavy etoh use in past  Snoring   Better   With appliance    Wants to check a discomfort in his left upper quadrant lower rib cage that he has had off and on for about 10 years. It isn't bad today he does state that there was a tiny degrees when he had part of the exercise equipment hit that area. No major change in bowel habits but does have llots of gas  With increase eating cherry  Utd? On colonoscopy sees Dr Earlean Shawl   Every 3-5? Years colon    No change in bowel habits  Had [name] abd  In end of may  And ok   5.5 mm gb polyps  ROS: See pertinent positives and negatives per HPI. etoh 2 per day  Exercises regularly and not effected   Past Medical History:  Diagnosis Date  . Adjustment disorder with anxiety 06/13/2009   Qualifier: Diagnosis of  By: Regis Bill MD, Standley Brooking  related to his sudden MI underwent counseling takes low-dose medication as needed had side effects from an SSRI   . Alcohol addiction ([place])   . Hyperlipidemia    Hx of low HDL zero CAC in 2000 at [place]  . Low testosterone   . STEMI (ST elevation myocardial infarction) ([place]) 04/26/2009   left circumflex DES     Family History  Problem Relation Age of Onset  . Tongue cancer Father        tobacco  . Throat cancer Father   . Cirrhosis Father        also heart problems from etoh   . Hypertension Mother   . Heart Problems Unknown        mom  in 25s     Social History   Social History  . Marital status: Married    Spouse name: N/A  . Number of children: N/A  . Years of education: N/A   Occupational History  . 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 Smoking status: Never Smoker  . Smokeless tobacco: Never Used  . Alcohol use 1.0 oz/week    2 Standard drinks or equivalent per week  . Drug use: No  . Sexual activity: Not Asked   Other Topics Concern  . 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 aspirin 81 MG tablet Take 81 mg by mouth daily.      . Coenzyme Q10 150 MG CAPS Take by mouth.      . doxycycline (DORYX) 100 MG DR capsule Take 100 mg by mouth daily.     . nitroGLYCERIN (NITROSTAT) 0.4 MG SL tablet Place 1 tablet (0.4 mg total) under the tongue every 5 (five) minutes as needed. 25 tablet 3  . rosuvastatin (CRESTOR) 10 MG tablet Take 10 mg by  mouth daily.     No facility-administered medications prior to visit.      EXAM:  BP 120/80 (BP Location: Right Arm, Patient Position: Sitting, Cuff Size: Normal)   Pulse 67   Temp 98.5 F (36.9 C) (Oral)   Wt 216 lb (98 kg)   BMI 29.29 kg/m   Body mass index is 29.29 kg/m.  GENERAL: vitals reviewed and listed above, alert, oriented, appears well hydrated and in no acute distress HEENT: atraumatic, conjunctiva  clear, no obvious abnormalities on inspection of external nose and earsNECK: no obvious masses on inspection palpation  LUNGS: clear to auscultation bilaterally, no wheezes, rales or rhonchi, good air movement CV: HRRR, no clubbing cyanosis or  peripheral edema nl cap refill  Abdomen:  Sof,t normal bowel sounds without hepatosplenomegaly, no guarding rebound or masses no CVA tenderness Points to anterio lateral t10 area no mass and  No splenomegaly noted  No g or  r   Skin non icteric  MS: moves all extremities without noticeable focal  abnormality PSYCH: pleasant and cooperative,  Lab Results  Component Value Date   WBC 4.8 12/30/2016   HGB 13.7 12/30/2016   HCT 44 12/30/2016   PLT 69 (A) 12/30/2016   GLUCOSE 99 10/08/2016   CHOL 100 10/08/2016    TRIG 177.0 (H) 10/08/2016   HDL 23.90 (L) 10/08/2016   LDLDIRECT 128.7 01/04/2009   LDLCALC 40 10/08/2016   ALT 47 10/08/2016   AST 35 10/08/2016   NA 139 10/08/2016   K 4.0 10/08/2016   CL 106 10/08/2016   CREATININE 0.87 10/08/2016   BUN 17 10/08/2016   CO2 28 10/08/2016   TSH 5.25 (H) 10/08/2016   PSA 0.30 10/08/2016   INR 1.1 05/01/2009     ASSESSMENT AND PLAN:  Discussed the following assessment and plan: Exam is benign and  review of record reassuring .   Since off and on for years  doubt other serious process but close observation   Prudent   Will follow  As planned  And due for labs at year or as  Per specialsit  .  -Patient advised to return or notify health care team  if symptoms worsen ,persist or new concerns arise.  Patient Instructions     Wt Readings from Last 3 Encounters:  04/10/17 216 lb (98 kg)  01/02/17 226 lb 12.8 oz (102.9 kg)  12/06/16 227 lb 11.2 oz (103.3 kg)   BP Readings from Last 3 Encounters:  04/10/17 120/80  01/02/17 (!) 156/84  12/06/16 134/70   Continue lifestyle intervention healthy eating and exercise . I am thinking  More related to chest abd wall   Issues that can flar  But you can   Touch base with  With Dr Earlean Shawl to be sure . No problem .   Ultrasound reassuring   That spleen  Is normal .   Keep  Yearly appt.         Standley Brooking. Cierria Height M.D.

## 2017-04-10 ENCOUNTER — Encounter: Payer: Self-pay | Admitting: Internal Medicine

## 2017-04-10 ENCOUNTER — Ambulatory Visit (INDEPENDENT_AMBULATORY_CARE_PROVIDER_SITE_OTHER): Payer: Medicare Other | Admitting: Internal Medicine

## 2017-04-10 VITALS — BP 120/80 | HR 67 | Temp 98.5°F | Wt 216.0 lb

## 2017-04-10 DIAGNOSIS — R109 Unspecified abdominal pain: Secondary | ICD-10-CM | POA: Diagnosis not present

## 2017-04-10 DIAGNOSIS — R0683 Snoring: Secondary | ICD-10-CM | POA: Diagnosis not present

## 2017-04-10 NOTE — Patient Instructions (Addendum)
   Wt Readings from Last 3 Encounters:  04/10/17 216 lb (98 kg)  01/02/17 226 lb 12.8 oz (102.9 kg)  12/06/16 227 lb 11.2 oz (103.3 kg)   BP Readings from Last 3 Encounters:  04/10/17 120/80  01/02/17 (!) 156/84  12/06/16 134/70   Continue lifestyle intervention healthy eating and exercise . I am thinking  More related to chest abd wall   Issues that can flar  But you can   Touch base with  With Dr Earlean Shawl to be sure . No problem .   Ultrasound reassuring   That spleen  Is normal .   Keep  Yearly appt.

## 2017-05-23 ENCOUNTER — Encounter: Payer: Self-pay | Admitting: Internal Medicine

## 2017-06-03 IMAGING — US US ABDOMEN COMPLETE
1 series · 13 of 25 positions shown · non-contrast
Comparison: 05/22/2010.

CLINICAL DATA: 66-year-old male with thrombocytopenia. Initial
encounter.

EXAM:
ABDOMEN ULTRASOUND COMPLETE

[Series 1: us abdomen complete · 0.22mm/px · 13 of 99 slices shown]
[im 1/99]
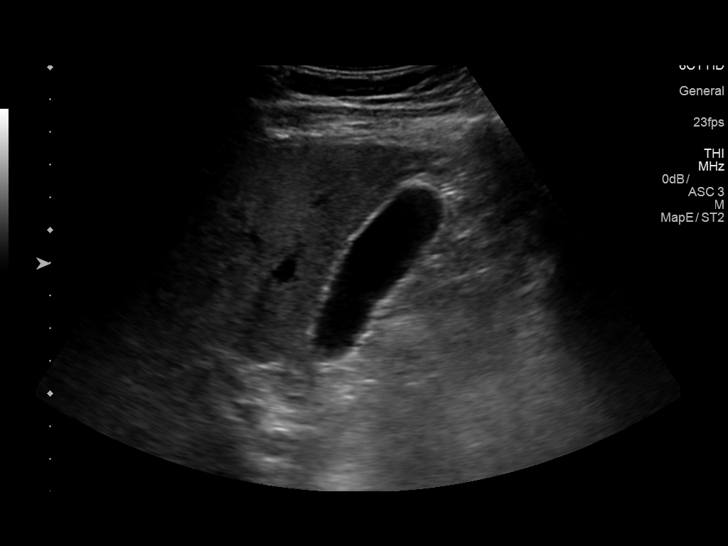
[im 9/99]
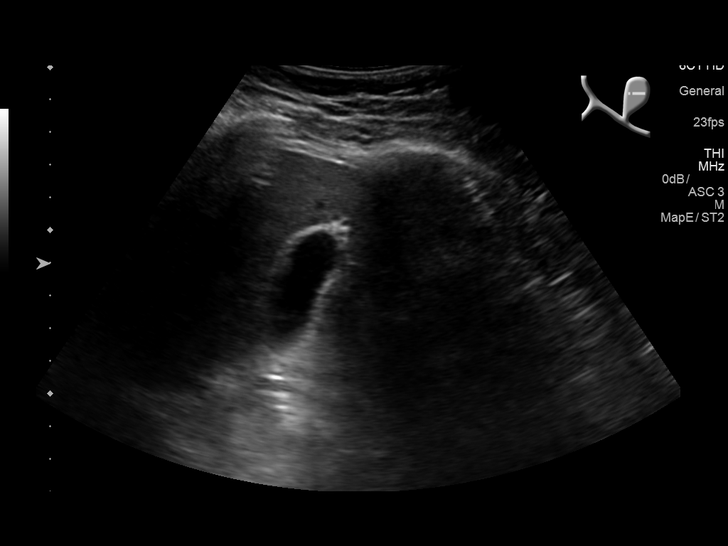
[im 17/99]
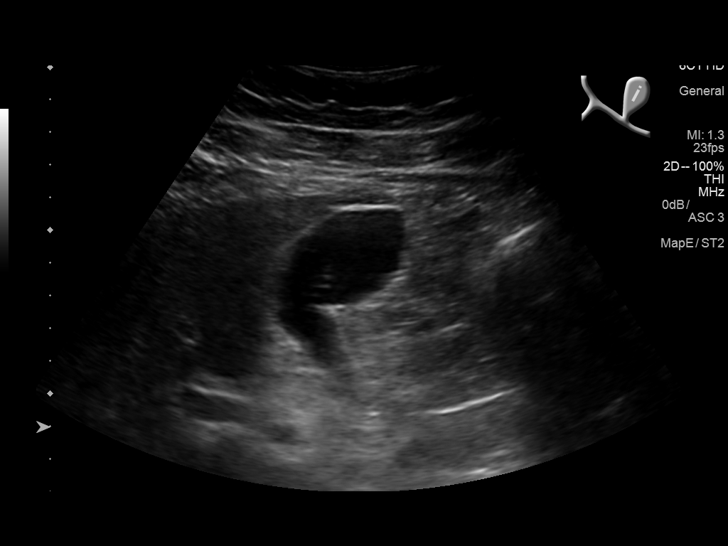
[im 25/99]
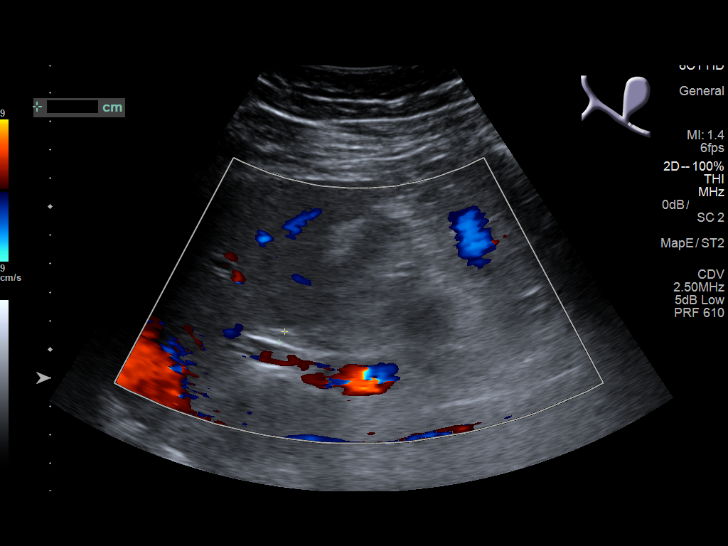
[im 33/99]
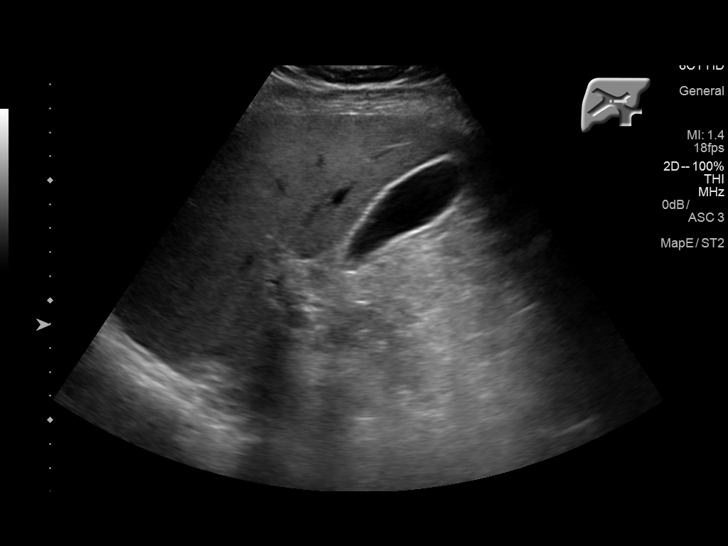
[im 41/99]
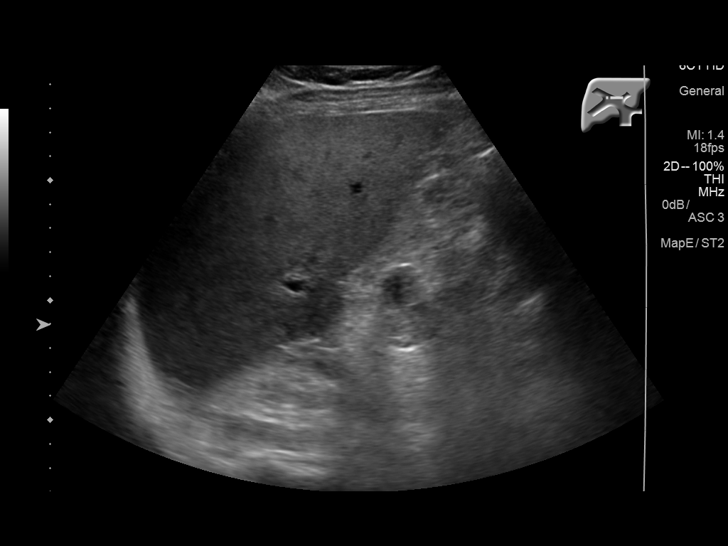
[im 50/99]
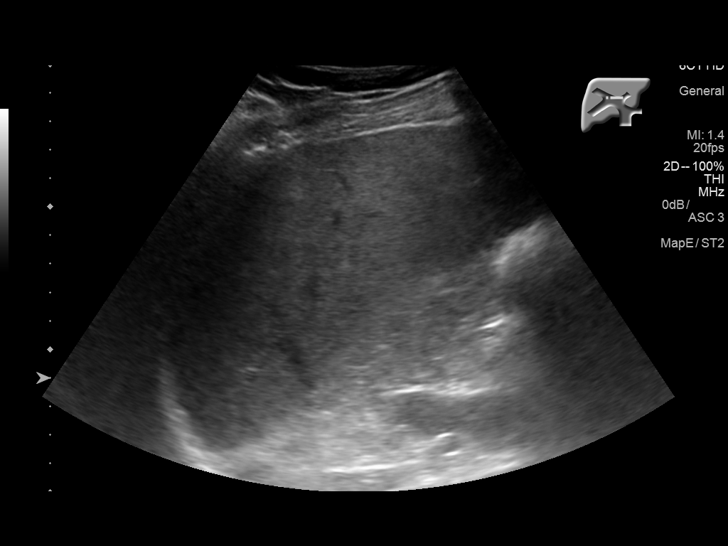
[im 58/99]
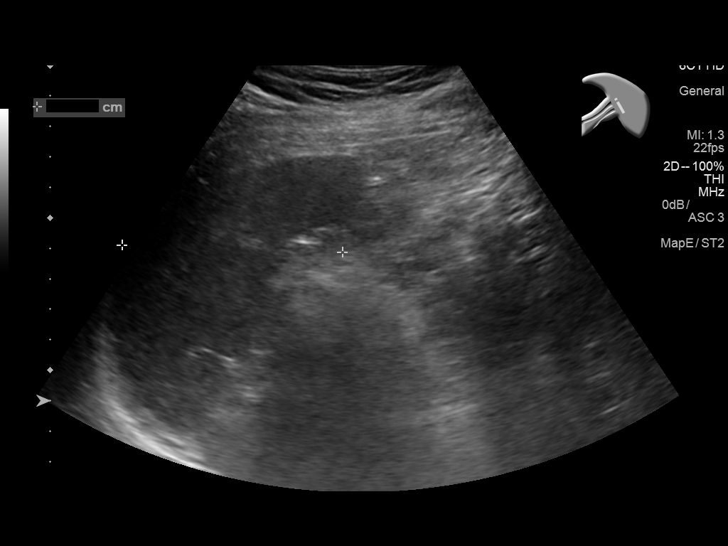
[im 66/99]
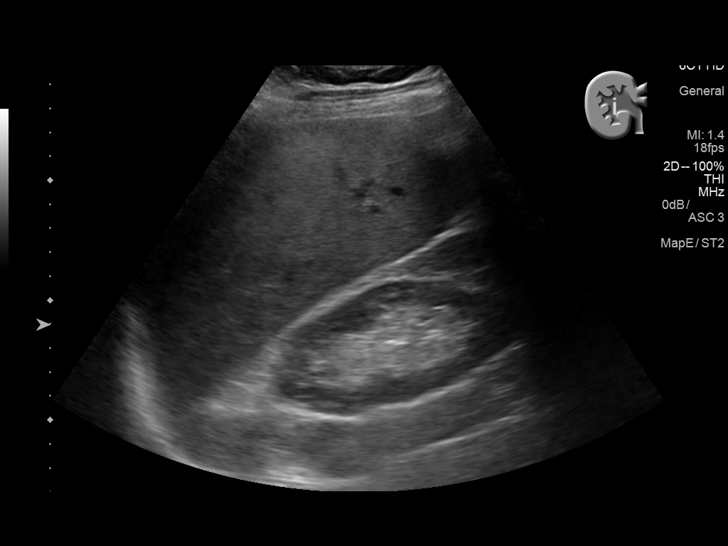
[im 74/99]
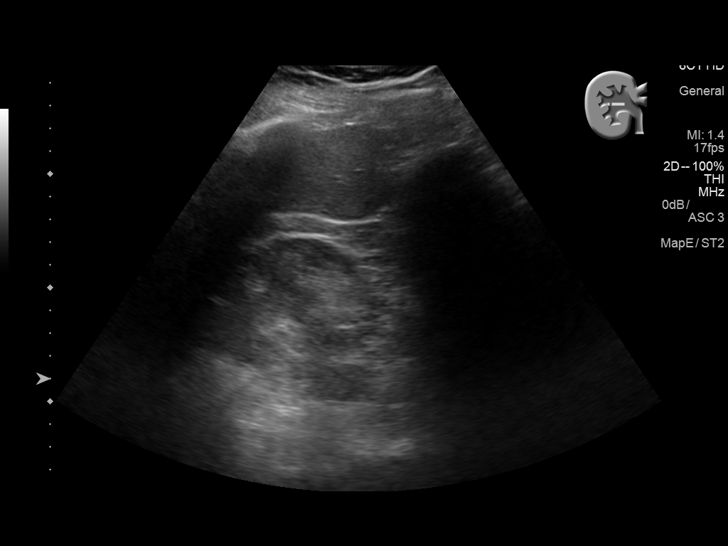
[im 82/99]
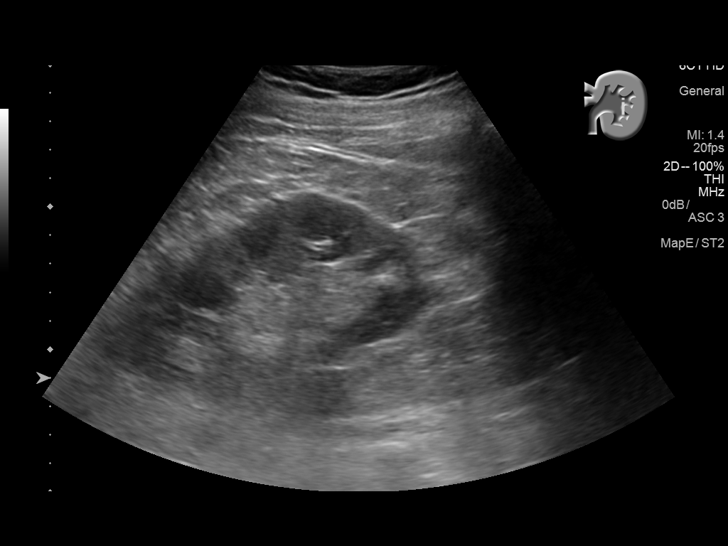
[im 90/99]
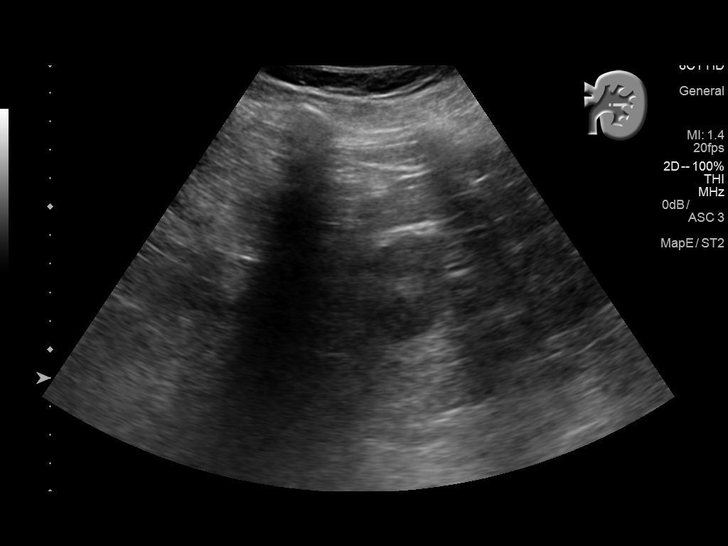
[im 99/99]
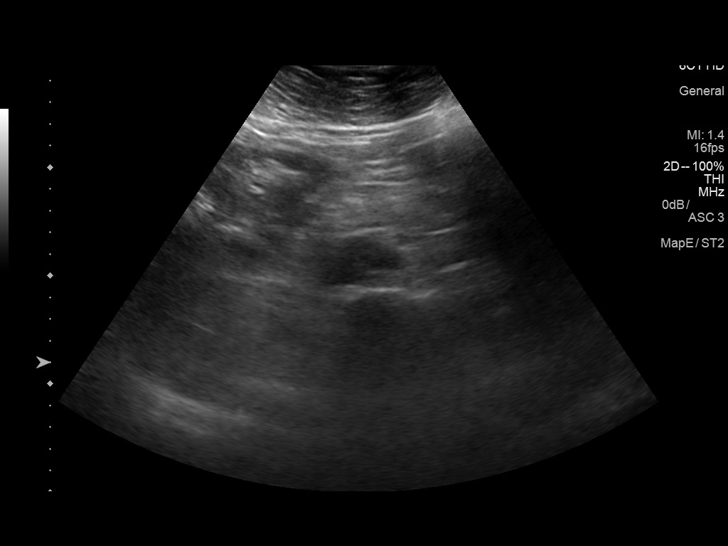

[13 of 25 positions shown; findings below may reference images not displayed]

FINDINGS: Gallbladder: 5.5 mm gallbladder polyp with stalk appears different
than on prior exam when this measured 2.6 mm and had no discernible
stalk. Second polyp previously noted not appreciated on the present
exam. Gallbladder otherwise unremarkable.

Common bile duct: Diameter: 4.2 mm.

Liver: No focal hepatic lesion. Minimal increased echogenicity may
represent very mild fatty infiltration.

IVC:  No abnormality visualized.

Pancreas: Suboptimally evaluated secondary to habitus and bowel gas.
Portions visualized unremarkable.

Spleen: Size and appearance within normal limits.

Right Kidney: Length: 10.7 cm. Echogenicity within normal limits. No
mass or hydronephrosis visualized.

Left Kidney: Length: 12.1 cm. Echogenicity within normal limits. No
mass or hydronephrosis visualized.

Abdominal aorta: Suboptimally evaluated secondary to habitus and
bowel gas. Portions visualized unremarkable.

Other findings: None.
IMPRESSION: 5.5 mm gallbladder polyp with stalk appears different than on prior
exam when this measured 2.6 mm and had no discernible stalk.

Very mild fatty infiltration of the liver.

No splenomegaly.

Pancreas and aorta are suboptimally evaluated secondary to bowel
gas. Portions visualized appear unremarkable.

## 2017-06-19 ENCOUNTER — Encounter: Payer: Self-pay | Admitting: Cardiovascular Disease

## 2017-06-19 ENCOUNTER — Ambulatory Visit (INDEPENDENT_AMBULATORY_CARE_PROVIDER_SITE_OTHER): Payer: Medicare Other | Admitting: Cardiovascular Disease

## 2017-06-19 VITALS — BP 124/78 | HR 73 | Ht 72.0 in | Wt 220.0 lb

## 2017-06-19 DIAGNOSIS — D696 Thrombocytopenia, unspecified: Secondary | ICD-10-CM

## 2017-06-19 DIAGNOSIS — G4733 Obstructive sleep apnea (adult) (pediatric): Secondary | ICD-10-CM | POA: Diagnosis not present

## 2017-06-19 DIAGNOSIS — I252 Old myocardial infarction: Secondary | ICD-10-CM

## 2017-06-19 DIAGNOSIS — I2510 Atherosclerotic heart disease of native coronary artery without angina pectoris: Secondary | ICD-10-CM | POA: Diagnosis not present

## 2017-06-19 DIAGNOSIS — E785 Hyperlipidemia, unspecified: Secondary | ICD-10-CM | POA: Diagnosis not present

## 2017-06-19 NOTE — Patient Instructions (Signed)

## 2017-06-19 NOTE — Progress Notes (Signed)
Patient ID: Jon Carter, male   DOB: 1951/02/11, 66 y.o.   MRN: [id number]    Primary M.D.: Dr. Berniece Andreas  HPI: Jon Carter is a 66 y.o. male who presents to the office today for a 5 month cardiology evaluation.  Jon Carter presented with an acute coronary syndrome/ST segment elevation myocardial infarction on 04/26/2009. I took him acutely to the cardiac catheterization laboratory where he was found to have total occlusion of his mid circumflex coronary artery. He had an excellent DTB time of less than 40 minutes and ultimately had a Xience DES 2.75x18 mm stent inserted into the circumflex, postdilated to 3.0 mm. Subsequently, he has been demonstrated to have complete salvage of myocardium. Initially, he had significant anxiety issues following his acute coronary syndrome. Ultimately, this has resolved.  He had worked as an Information systems manager.  Typically he is off the summer.  For the last several years he has been traveling extensively to both [place] as well as [place] and [place].  He remains active.  He denies any episodes of chest pain.  He continues to exercise at least 5 days per week at the gym.  He denies PND, orthopnea.  He is resolved.    His last stress test  in August 2013 continued to show normal perfusion.  In the past, he had some issues with mild thrombocytopenia.  He has been maintained on Crestor 10 mg daily.  He is on aspirin.  He is no longer taking any beta blocker or ACE inhibitor. Lipid studies in January 2017 were excellent with a total cholesterol 99, LDL 45, VLDL 23, and triglycerides 591.  HDL was low at 30.  Renal function was normal.  Transaminases were mildly elevated with an ALT of 61 and AST normal at 37.   Since I last saw him he traveled to [place] and [place] last summer.  He has been without chest pian or exertional symptoms. He will be retiing in May.  He had recent lab work by Dr. Fabian Sharp. He has had a h/o reduced platelets, but these were now 72K on  10/09/16. TC was 100; LDL 40 but LDL was low at 23.  LFTs were now normal.  He denies 0chest pain. He works out at [employer] at least 3 -5 days per week doing elliptical workouts.  He will be traveling to [place] and [place] this summer.  When I saw him, he stated that his wife had noticed that he was snoring loudly and had  potential apneic events while he has been sleeping.  He felt that he has slept relatively well, but he would wake up 2 times per night to go to the bathroom.  He denied any excessive daytime sleepiness.  An Epworth Sleepiness Scale score was calculated as shown below:   Epworth Sleepiness Scale: Situation   Chance of Dozing/Sleeping (0 = never , 1 = slight chance , 2 = moderate chance , 3 = high chance )   sitting and reading 0   watching TV 1   sitting inactive in a public place 0   being a passenger in a motor vehicle for an hour or more 1   lying down in the afternoon 3   sitting and talking to someone 0   sitting quietly after lunch (no alcohol) 0   while stopped for a few minutes in traffic as the driver 0   Total Score  5   He underwent a sleep study on 12/02/2016 ,  Which demonstrated mild obstructive sleep apnea overall with an AHI of 5.4 per hour.  However, his respiratory disturbance index was moderately abnormal at 23.8 per hour.  He had mild-to-moderate sleep apnea during REM sleep with an AHI of 14 per hour and RDI of 21.9 per hour.  There was mild oxygen desaturation to a nadir of 87%.  He snored loudly.   At that office visit, he was about to go to [place] for 2 months.  We discussed CPAP versus customized dental appliance. I  referred him to to Dr. Oneal Grout , and he was fitted with a by his appliance that he was able to take to [place] and use.  He has noticed that his sleep is improved.  He is snoring has significantly improved.  His sleep is more restorative.  A recent ApneicLink from 06/11/2017 showed an apneic hypopnea index of 3.5 overall and 3.9 with supine sleep.   Average oxygen saturation was 93% with the lowest at 88%.  He tells me Dr. Ron Parker will be doing some additional mild tweaking to his oral appliance.  He has undergone  a Hematology evaluation at [facility] for his chronic thrombocytopenia and monocytosis , which appear stable.  Since his return from [place], he began to exercise more vigorously and began to notice some left upper quadrant-like discomfort.  He is concerned perhaps this may be his heart.  However, he was diagnosed with costochondritis and his symptoms completely resolved.  Now that he is retired, he has more time off.  He will be returning to [place] next week for 3-4 weeks.  He presents for evaluation  Past Medical History:  Diagnosis Date  . Adjustment disorder with anxiety 06/13/2009   Qualifier: Diagnosis of  By: Regis Bill MD, Standley Brooking  related to his sudden MI underwent counseling takes low-dose medication as needed had side effects from an SSRI   . Alcohol addiction ([place])   . Hyperlipidemia    Hx of low HDL zero CAC in 2000 at [place]  . Low testosterone   . STEMI (ST elevation myocardial infarction) ([place]) 04/26/2009   left circumflex DES     Past Surgical History:  Procedure Laterality Date  . CARDIAC CATHETERIZATION  05/02/2009   UA - patent Cfx stent, 50-60% LAD, 50-60% RCA with normal LV funtion (Dr. Adora Fridge)  . CATARACT EXTRACTION Left 2009   [facility]  . CORONARY ANGIOPLASTY WITH STENT PLACEMENT  04/26/2009   ACS/STEMI - total occlusion of mid Cfx - Xience DES 2.75x10m (Dr. TCorky Downs  . NM MYOCAR PERF WALL MOTION  04/2012   bruce myoview; mild perfusion defect in basal inferior region (infarct/scar or overlying attenuation); post-stress EF 53%, EKG negative for ischemia, hypertensive response to exercise; abnormal study but unchanged from previous study  . TRANSTHORACIC ECHOCARDIOGRAM  05/2009   EF=50-55%; trace MR; mild TR, normal RSVP; AV mildly sclerotic    No Known Allergies  Current Outpatient Prescriptions  Medication  Sig Dispense Refill  . aspirin 81 MG tablet Take 81 mg by mouth daily.      . Coenzyme Q10 150 MG CAPS Take by mouth.      . doxycycline (DORYX) 100 MG DR capsule Take 100 mg by mouth daily.     . nitroGLYCERIN (NITROSTAT) 0.4 MG SL tablet Place 1 tablet (0.4 mg total) under the tongue every 5 (five) minutes as needed. 25 tablet 3  . rosuvastatin (CRESTOR) 10 MG tablet Take 10 mg by mouth daily.  No current facility-administered medications for this visit.     Social History   Social History  . Marital status: Married    Spouse name: N/A  . Number of children: N/A  . Years of education: N/A   Occupational History  . 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 Smoking status: Never Smoker  . Smokeless tobacco: Never Used  . Alcohol use 1.0 oz/week    2 Standard drinks or equivalent per week  . Drug use: No  . Sexual activity: Not on file   Other Topics Concern  . 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 [employer].  He is retired from [employer] after being in [employer] for 30 years.  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  He he has the summer off and typically travels abroad and remains active.  He exercises daily. There is no tobacco use.  ROS General: Negative; No fevers, chills, or night sweats;  HEENT: Negative; No changes in vision or hearing, sinus congestion, difficulty swallowing Pulmonary: Negative; No cough, wheezing, shortness of breath, hemoptysis Cardiovascular:  No recurrent anginal symptoms.  No PND, orthopnea.  No palpitations GI: Negative; No nausea, vomiting, diarrhea, or abdominal pain GU: Negative; No dysuria,  hematuria, or difficulty voiding Musculoskeletal: Negative; no myalgias, joint pain, or weakness Hematologic/Oncology: Negative; no easy bruising, bleeding Endocrine: Negative; no heat/cold intolerance; no diabetes Neuro: Negative; no changes in balance, headaches Skin: Negative; No rashes or skin lesions Psychiatric: Negative; No behavioral problems, depression Sleep:Positive for OSA, now with a customized oral dental appliance; no daytime sleepiness, hypersomnolence, bruxism, restless legs, hypnogognic hallucinations, no cataplexy Other comprehensive 14 point system review is negative.   PE BP 124/78   Pulse 73   Ht 6' (1.829 m)   Wt 220 lb (99.8 kg)   BMI 29.84 kg/m    Repeat blood pressure by me 126/78  Wt Readings from Last 3 Encounters:  06/19/17 220 lb (99.8 kg)  04/10/17 216 lb (98 kg)  01/02/17 226 lb 12.8 oz (102.9 kg)   General: Alert, oriented, no distress.  Skin: normal turgor, no rashes, warm and dry HEENT: Normocephalic, atraumatic. Pupils equal round and reactive to light; sclera anicteric; extraocular muscles intact;  Nose without nasal septal hypertrophy Mouth/Parynx benign; Mallinpatti scale 3 Neck: No JVD, no carotid bruits; normal carotid upstroke Lungs: clear to ausculatation and percussion; no wheezing or rales Chest wall: without tenderness to palpitation; resolution of prior costochondritis Heart: PMI not displaced, RRR, s1 s2 normal, 1/6 systolic murmur, no diastolic murmur, no rubs, gallops, thrills, or heaves Abdomen: soft, nontender; no hepatosplenomehaly, BS+; abdominal aorta nontender and not dilated by palpation. Back: no CVA tenderness Pulses 2+ Musculoskeletal: full range of motion, normal strength, no joint deformities Extremities: no clubbing cyanosis or edema, Homan's sign negative  Neurologic: grossly nonfocal; Cranial nerves grossly wnl Psychologic: Normal mood and affect    ECG (independently read by me): Normal sinus rhythm at 73  bpm.  No  ectopy.  Normal intervals.  May 2018 ECG (independently read by me): Normal sinus rhythm at 76 bpm.  Normal intervals.  No ST segment changes.  February 2018 ECG (independently read by me): Normal sinus rhythm at 68 bpm with mild sinus arrhythmia.  Isolated PVC.  Normal intervals.  April 2017 ECG (independently read by me): Normal sinus rhythm at 93 bpm.  No ectopy.  Normal intervals.  No ECG evidence of prior MI.  April 2016 ECG (independently read by me): Normal sinus rhythm at 64.  No ectopy.  Normal intervals.  January 2015 ECG (independently interpreted by me): Normal sinus rhythm at 71 beats per minute. No significant ST changes. Normal intervals.  Last ECG of 03/26/2013: Normal sinus rhythm at 71 beats per minute. Normal intervals.  LABS:  BMP Latest Ref Rng & Units 10/08/2016 10/03/2015 03/31/2015  Glucose 70 - 99 mg/dL 99 93 81  BUN 6 - 23 mg/dL '17 13 13  '$ Creatinine 0.40 - 1.50 mg/dL 0.87 0.79 0.85  Sodium 135 - 145 mEq/L 139 141 140  Potassium 3.5 - 5.1 mEq/L 4.0 4.4 4.1  Chloride 96 - 112 mEq/L 106 104 104  CO2 19 - 32 mEq/L 28 32 27  Calcium 8.4 - 10.5 mg/dL 9.0 9.4 9.6    Hepatic Function Latest Ref Rng & Units 10/08/2016 04/12/2016 01/08/2016  Total Protein 6.0 - 8.3 g/dL 7.8 7.8 7.7  Albumin 3.5 - 5.2 g/dL 4.2 4.2 4.1  AST 0 - 37 U/L 35 35 38(H)  ALT 0 - 53 U/L 47 47 56(H)  Alk Phosphatase 39 - 117 U/L 33(L) 39 31(L)  Total Bilirubin 0.2 - 1.2 mg/dL 0.5 0.5 0.5  Bilirubin, Direct 0.0 - 0.3 mg/dL 0.1 0.1 0.1    CBC Latest Ref Rng & Units 12/30/2016 10/08/2016 10/03/2015  WBC 10:3/mL 4.8 4.8 4.5  Hemoglobin 13.5 - 17.5 g/dL 13.7 14.6 14.8  Hematocrit 41 - 53 % 44 43.0 45.4  Platelets 150 - 399 K/L 69(A) 72.0 Repeated and verified X2.(L) 114.0(L)   Lab Results  Component Value Date   TSH 5.25 (H) 10/08/2016    BNP    Component Value Date/Time   PROBNP 99.0 04/28/2009 0420    Lipid Panel     Component Value Date/Time   CHOL 100 10/08/2016 0816   TRIG  177.0 (H) 10/08/2016 0816   TRIG 347 (HH) 07/17/2006 1010   HDL 23.90 (L) 10/08/2016 0816   CHOLHDL 4 10/08/2016 0816   VLDL 35.4 10/08/2016 0816   LDLCALC 40 10/08/2016 0816    RADIOLOGY: No results found.  IMPRESSION:  1. CAD in native artery   2. Old MI (myocardial infarction)   3. OSA (obstructive sleep apnea)   4. Thrombocytopenia ([place])   5. Hyperlipidemia with target LDL less than 70     ASSESSMENT AND PLAN: Jon Carter is a 66 year old white male who continues to do well  8 years following his acute coronary syndrome when he presented with left circumflex occlusion and underwent successful  early revascularization. He  has had complete salvage of myocardium verified by nuclear imaging.  Presently, he continues to do well and is not having any recurrent anginal symptoms and continues to experience excellent exercise program of exercises 5-6 days per week.  He has traveled to [place] as well as to [place] and [place] where he is hiked vigorously without recurrent symptomatology.  He retired in May and this past summer spent 2 weeks in [place] and will be going back to  [place] next week.  He recently had developed an episode of chest wall symptoms which are more likely costochondral in etiology and ultimately resolved.  He denies any exertionally precipitated chest pain.  His blood pressure today is stable.  He is on Crestor 10 mg for hyperlipidemia and in February 2018 LDL was excellent at 40.  He has obstructive sleep apnea that is mild overall but moderate RDI in mild to moderate with rim sleep.  His symptoms have significant improved since initiating and customized oral appliance.  With his extensive travel this seems to have been a good initial treatment modality.  He is still undergoing periodic tweaking of his device for optimal therapy.  He denies any bleeding.  Spine and his thrombocytopenia and in 10/2016 platelet count was 72,000.  As long as he remains stable, I will see him in 6  months for reevaluation. Time spent: 25 minutes  Troy Sine, MD, [facility]  06/21/2017 2:25 PM

## 2017-06-23 ENCOUNTER — Other Ambulatory Visit: Payer: Self-pay | Admitting: Cardiovascular Disease

## 2017-06-23 NOTE — Telephone Encounter (Signed)
REFILL 

## 2017-08-31 ENCOUNTER — Other Ambulatory Visit: Payer: Self-pay | Admitting: Cardiovascular Disease

## 2017-09-06 ENCOUNTER — Ambulatory Visit (HOSPITAL_COMMUNITY)
Admission: EM | Admit: 2017-09-06 | Discharge: 2017-09-06 | Disposition: A | Discharge status: Home / Self Care | Payer: Medicare Other | Attending: Family Medicine | Admitting: Family Medicine

## 2017-09-06 ENCOUNTER — Other Ambulatory Visit: Payer: Self-pay

## 2017-09-06 ENCOUNTER — Encounter (HOSPITAL_COMMUNITY): Payer: Self-pay | Admitting: Emergency Medicine

## 2017-09-06 DIAGNOSIS — B001 Herpesviral vesicular dermatitis: Secondary | ICD-10-CM

## 2017-09-06 MED ORDER — VALACYCLOVIR HCL 1 G PO TABS: 1000.0000 mg | ORAL_TABLET | Freq: Two times a day (BID) | ORAL | 3 refills | Status: DC

## 2017-09-06 MED ORDER — MUPIROCIN 2 % EX OINT: 1.0000 "application " | TOPICAL_OINTMENT | Freq: Three times a day (TID) | CUTANEOUS | 0 refills | Status: DC

## 2017-09-06 NOTE — ED Triage Notes (Signed)
Pt c/o cold sore on this chin, pt states "I need a prescription for it".

## 2017-09-10 NOTE — ED Provider Notes (Signed)
[place]   [id number] 09/06/17 Arrival Time: 8119  ASSESSMENT & PLAN:  1. Cold sore     Meds ordered this encounter  Medications  . valACYclovir (VALTREX) 1000 MG tablet    Sig: Take 1 tablet (1,000 mg total) by mouth 2 (two) times daily.    Dispense:  20 tablet    Refill:  3  . mupirocin ointment (BACTROBAN) 2 %    Sig: Apply 1 application topically 3 (three) times daily.    Dispense:  22 g    Refill:  0   May f/u as needed.  Reviewed expectations re: course of current medical issues. Questions answered. Outlined signs and symptoms indicating need for more acute intervention. Patient verbalized understanding. After Visit Summary given.   SUBJECTIVE:  Jon Carter is a 67 y.o. male who presents with complaint of frequent cold sores on lips or around lips. Noticed one coming on this morning. Out of Valtrex. Requests refill. Minimal discomfort. Just below lower lip. Afebrile. No recent illnesses. Normal PO intake. No specific aggravating or alleviating factors reported. No OTC treatment.  ROS: As per HPI.  OBJECTIVE: Vitals:   09/06/17 1856 09/06/17 1857  BP:  (!) 156/85  Pulse: 80   Resp: 16   Temp: 97.7 F (36.5 C)   SpO2: 100%     General appearance: alert; no distress Lungs: clear to auscultation bilaterally Heart: regular rate and rhythm Extremities: no edema Skin: warm and dry; cold sore just below lower lip that is erythematous; less than 1 cm with irregular borders Psychological: alert and cooperative; normal mood and affect  No Known Allergies  Past Medical History:  Diagnosis Date  . Adjustment disorder with anxiety 06/13/2009   Qualifier: Diagnosis of  By: Regis Bill MD, Standley Brooking  related to his sudden MI underwent counseling takes low-dose medication as needed had side effects from an SSRI   . Alcohol addiction ([place])   . Hyperlipidemia    Hx of low HDL zero CAC in 2000 at [place]  . Low testosterone   . STEMI (ST elevation myocardial  infarction) ([place]) 04/26/2009   left circumflex DES    Social History   Socioeconomic History  . Marital status: Married    Spouse name: Not on file  . Number of children: Not on file  . Years of education: Not on file  . Highest education level: Not on file  Social Needs  . Financial resource strain: Not on file  . Food insecurity - worry: Not on file  . Food insecurity - inability: Not on file  . Transportation needs - medical: Not on file  . Transportation needs - non-medical: Not on file  Occupational History  . Occupation: Tour manager: [employer]  Tobacco Use  . Smoking status: Never Smoker  . Smokeless tobacco: Never Used  Substance and Sexual Activity  . Alcohol use: Yes    Alcohol/week: 1.0 oz    Types: 2 Standard drinks or equivalent per week  . Drug use: No  . Sexual activity: Not on file  Other Topics Concern  . 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 Tongue cancer Father        tobacco  . Throat cancer Father   . Cirrhosis Father        also heart problems from etoh   . Hypertension Mother   . Heart Problems Unknown        mom  in 31s    Past Surgical History:  Procedure Laterality Date  . CARDIAC CATHETERIZATION  05/02/2009   UA - patent Cfx stent, 50-60% LAD, 50-60% RCA with normal LV funtion (Dr. Adora Fridge)  . CATARACT EXTRACTION Left 2009   [facility]  . CORONARY ANGIOPLASTY WITH STENT PLACEMENT  04/26/2009   ACS/STEMI - total occlusion of mid Cfx - Xience DES 2.75x18mm (Dr. Corky Downs)  . NM MYOCAR PERF WALL MOTION  04/2012   bruce myoview; mild perfusion defect in basal inferior region (infarct/scar or overlying attenuation);  post-stress EF 53%, EKG negative for ischemia, hypertensive response to exercise; abnormal study but unchanged from previous study  . TRANSTHORACIC ECHOCARDIOGRAM  05/2009   EF=50-55%; trace MR; mild TR, normal RSVP; AV mildly sclerotic     Vanessa Kick, MD 09/10/17 [phone]

## 2017-10-10 ENCOUNTER — Telehealth: Payer: Self-pay | Admitting: Cardiovascular Disease

## 2017-10-10 MED ORDER — NITROGLYCERIN 0.4 MG SL SUBL: 0.4000 mg | SUBLINGUAL_TABLET | SUBLINGUAL | 3 refills | Status: DC | PRN

## 2017-10-10 NOTE — Telephone Encounter (Signed)
New message   Prior auth on nitroGLYCERIN (NITROSTAT) 0.4 MG SL tablet.  [facility] [address] [phone]

## 2017-10-14 ENCOUNTER — Other Ambulatory Visit (INDEPENDENT_AMBULATORY_CARE_PROVIDER_SITE_OTHER): Payer: Medicare Other

## 2017-10-14 ENCOUNTER — Other Ambulatory Visit: Payer: Self-pay | Admitting: Internal Medicine

## 2017-10-14 DIAGNOSIS — R7989 Other specified abnormal findings of blood chemistry: Secondary | ICD-10-CM

## 2017-10-14 DIAGNOSIS — R945 Abnormal results of liver function studies: Secondary | ICD-10-CM

## 2017-10-14 DIAGNOSIS — E291 Testicular hypofunction: Secondary | ICD-10-CM

## 2017-10-14 DIAGNOSIS — E785 Hyperlipidemia, unspecified: Secondary | ICD-10-CM

## 2017-10-14 DIAGNOSIS — E781 Pure hyperglyceridemia: Secondary | ICD-10-CM

## 2017-10-14 DIAGNOSIS — F528 Other sexual dysfunction not due to a substance or known physiological condition: Secondary | ICD-10-CM | POA: Diagnosis not present

## 2017-10-14 LAB — CBC WITH DIFFERENTIAL/PLATELET
BASOS ABS: 0 10*3/uL (ref 0.0–0.1)
Basophils Relative: 0.4 % (ref 0.0–3.0)
Eosinophils Absolute: 0 10*3/uL (ref 0.0–0.7)
Eosinophils Relative: 0.3 % (ref 0.0–5.0)
HCT: 43.9 % (ref 39.0–52.0)
Hemoglobin: 14.8 g/dL (ref 13.0–17.0)
LYMPHS ABS: 1.8 10*3/uL (ref 0.7–4.0)
Lymphocytes Relative: 33.4 % (ref 12.0–46.0)
MCHC: 33.7 g/dL (ref 30.0–36.0)
MCV: 89.7 fl (ref 78.0–100.0)
MONOS PCT: 35.8 % — AB (ref 3.0–12.0)
Monocytes Absolute: 2 10*3/uL — ABNORMAL HIGH (ref 0.1–1.0)
NEUTROS PCT: 30.1 % — AB (ref 43.0–77.0)
Neutro Abs: 1.6 10*3/uL (ref 1.4–7.7)
Platelets: 73 10*3/uL — ABNORMAL LOW (ref 150.0–400.0)
RBC: 4.9 Mil/uL (ref 4.22–5.81)
RDW: 15.4 % (ref 11.5–15.5)
WBC: 5.5 10*3/uL (ref 4.0–10.5)

## 2017-10-14 LAB — HEPATIC FUNCTION PANEL
ALK PHOS: 37 U/L — AB (ref 39–117)
ALT: 27 U/L (ref 0–53)
AST: 24 U/L (ref 0–37)
Albumin: 4.2 g/dL (ref 3.5–5.2)
BILIRUBIN TOTAL: 0.6 mg/dL (ref 0.2–1.2)
Bilirubin, Direct: 0.1 mg/dL (ref 0.0–0.3)
Total Protein: 7.9 g/dL (ref 6.0–8.3)

## 2017-10-14 LAB — BASIC METABOLIC PANEL
BUN: 15 mg/dL (ref 6–23)
CALCIUM: 9.1 mg/dL (ref 8.4–10.5)
CO2: 29 mEq/L (ref 19–32)
CREATININE: 0.86 mg/dL (ref 0.40–1.50)
Chloride: 104 mEq/L (ref 96–112)
GFR: 94.29 mL/min (ref >60.00)
Glucose, Bld: 85 mg/dL (ref 70–99)
Potassium: 4 mEq/L (ref 3.5–5.1)
Sodium: 141 mEq/L (ref 135–145)

## 2017-10-14 LAB — TSH: TSH: 8.03 u[IU]/mL — ABNORMAL HIGH (ref 0.35–4.50)

## 2017-10-14 LAB — LIPID PANEL
CHOLESTEROL: 85 mg/dL (ref 0–200)
HDL: 24.1 mg/dL — ABNORMAL LOW (ref >39.00)
LDL CALC: 40 mg/dL (ref 0–99)
NonHDL: 61.23
Total CHOL/HDL Ratio: 4
Triglycerides: 108 mg/dL (ref 0.0–149.0)
VLDL: 21.6 mg/dL (ref 0.0–40.0)

## 2017-10-14 LAB — PSA: PSA: 0.46 ng/mL (ref 0.10–4.00)

## 2017-10-15 LAB — T4, FREE: Free T4: 1.15 ng/dL (ref 0.60–1.60)

## 2017-10-17 NOTE — Progress Notes (Signed)
Chief Complaint  Patient presents with  . Annual Exam    No new concerns.  lab monitoring     HPI: Jon Carter 67 y.o. comes in today for Preventive Medicare exam/ wellness visit .Since last visit. Doing well retired traveling exercising  lecft rig cage area  Felt to be  insig   Cause . No bleeding no new meds  Nfish oil   Health Maintenance  Topic Date Due  . COLONOSCOPY  04/14/2024  . TETANUS/TDAP  01/21/2027  . INFLUENZA VACCINE  Completed  . Hepatitis C Screening  Completed  . PNA vac Low Risk Adult  Completed   Health Maintenance Review LIFESTYLE:  Exercise:   Weights and treadmill.s   3-4 times per week.   Rib cage.     .  Tobacco/ETS: no Alcohol:  1-2 per night  And has cut back .   Sugar beverages: no Sleep: 8 hours  Sleep testing and  Appliance   nore labs.  Drug use: no HH:2  No pets     Hearing: ok  Vision:  No limitations at present . Last eye check UTD  Safety:  Has smoke detector and wears seat belts.  No firearms. No excess sun exposure. Sees dentist regularly.  Falls: n  Memory: Felt to be good  , no concern from  family.  Depression: No anhedonia unusual crying or depressive symptoms anxiety is stable not  Over whelming   Nutrition: Eats well balanced diet; adequate calcium and vitamin D. No swallowing chewing problems.  Injury: no major injuries in the last six months.  Other healthcare providers:  Reviewed today .  Social:  Lives with spouse married. No pets.   Preventive parameters: up-to-date  Reviewed   ADLS:   There are no problems or need for assistance  driving, feeding, obtaining food, dressing, toileting and bathing, managing money using phone.is independent.   ROS:  GEN/ HEENT: No fever, significant weight changes sweats headaches vision problems hearing changes, CV/ PULM; No chest pain shortness of breath cough, syncope,edema  change in exercise tolerance. GI /GU: No adominal pain, vomiting, change in bowel habits. No  blood in the stool. No significant GU symptoms. SKIN/HEME: ,no acute skin rashes suspicious lesions or bleeding. No lymphadenopathy, nodules, masses.  NEURO/ PSYCH:  No neurologic signs such as weakness numbness. No depression anxiety. IMM/ Allergy: No unusual infections.  Allergy .   REST of 12 system review negative except as per HPI   Past Medical History:  Diagnosis Date  . Adjustment disorder with anxiety 06/13/2009   Qualifier: Diagnosis of  By: Regis Bill MD, Standley Brooking  related to his sudden MI underwent counseling takes low-dose medication as needed had side effects from an SSRI   . Alcohol addiction ([place])   . Hyperlipidemia    Hx of low HDL zero CAC in 2000 at [facility]  . Low testosterone   . STEMI (ST elevation myocardial infarction) ([place]) 04/26/2009   left circumflex DES     Family History  Problem Relation Age of Onset  . Tongue cancer Father        tobacco  . Throat cancer Father   . Cirrhosis Father        also heart problems from etoh   . Hypertension Mother   . Heart Problems Unknown        mom  in 46s     Social History   Socioeconomic History  . Marital status: Married    Spouse  name: Not on file  . Number of children: Not on file  . Years of education: Not on file  . Highest education level: Not on file  Social Needs  . Financial resource strain: Not on file  . Food insecurity - worry: Not on file  . Food insecurity - inability: Not on file  . Transportation needs - medical: Not on file  . Transportation needs - non-medical: Not on file  Occupational History  . Occupation: Tour manager: [employer]  Tobacco Use  . Smoking status: Never Smoker  . Smokeless tobacco: Never Used  Substance and Sexual Activity  . Alcohol use: Yes    Alcohol/week: 1.0 oz    Types: 2 Standard drinks or equivalent per week  . Drug use: No  . Sexual activity: Not on file  Other Topics Concern  . Not on file  Social History Narrative   Designated Party  Release signed on 02/05/2010     [phone]    married works at [employer] retired Therapist, art reserve PhD Surveyor, quantity   never smoked regular exercise household  3    no pets   2-3 beverages per day wine      Retired may 18             Outpatient Encounter Medications as of 10/20/2017  Medication Sig  . aspirin 81 MG tablet Take 81 mg by mouth daily.    . Coenzyme Q10 150 MG CAPS Take by mouth.    . rosuvastatin (CRESTOR) 20 MG tablet TAKE 1 TABLET BY MOUTH ONCE DAILY  . [DISCONTINUED] nitroGLYCERIN (NITROSTAT) 0.4 MG SL tablet Place 1 tablet (0.4 mg total) under the tongue every 5 (five) minutes as needed.  . [DISCONTINUED] doxycycline (DORYX) 100 MG DR capsule Take 100 mg by mouth daily.   . [DISCONTINUED] mupirocin ointment (BACTROBAN) 2 % Apply 1 application topically 3 (three) times daily. (Patient not taking: Reported on 10/20/2017)  . [DISCONTINUED] valACYclovir (VALTREX) 1000 MG tablet Take 1 tablet (1,000 mg total) by mouth 2 (two) times daily. (Patient not taking: Reported on 10/20/2017)   No facility-administered encounter medications on file as of 10/20/2017.     EXAM:  BP 124/70 (BP Location: Right Arm, Patient Position: Sitting, Cuff Size: Normal)   Pulse 72   Temp 97.7 F (36.5 C) (Oral)   Ht 5\' 11"  (1.803 m)   Wt 218 lb 3.2 oz (99 kg)   BMI 30.43 kg/m   Body mass index is 30.43 kg/m.  Physical Exam: Vital signs reviewed OAC:ZYSA is a well-developed well-nourished alert cooperative   who appears stated age in no acute distress.  HEENT: normocephalic atraumatic , Eyes: PERRL EOM's full, conjunctiva clear, Nares: paten,t no deformity discharge or tenderness., Ears: no deformity EAC's clear TMs with normal landmarks. Mouth: clear OP, no lesions, edema.  Moist mucous membranes. Dentition in adequate repair. NECK: supple without masses, thyromegaly or bruits. CHEST/PULM:  Clear to auscultation and percussion breath sounds equal no wheeze , rales or rhonchi. No chest wall  deformities or tenderness. Located left left lateral rib cage cc joint  CV: PMI is nondisplaced, S1 S2 no gallops, murmurs, rubs. Peripheral pulses are full without delay.No JVD .  ABDOMEN: Bowel sounds normal nontender  No guard or rebound, no hepato splenomegal no CVA tenderness.   Extremtities:  No clubbing cyanosis or edema, no acute joint swelling or redness no focal atrophy NEURO:  Oriented x3, cranial nerves 3-12 appear to  be intact, no obvious focal weakness,gait within normal limits no abnormal reflexes or asymmetrical SKIN: No acute rashes normal turgor, color, no bruising or petechiae. PSYCH: Oriented, good eye contact, no obvious depression anxiety, cognition and judgment appear normal. LN: no cervical axillary inguinal adenopathy No noted deficits in memory, attention, and speech.   Lab Results  Component Value Date   WBC 5.5 10/14/2017   HGB 14.8 10/14/2017   HCT 43.9 10/14/2017   PLT 73.0 Repeated and verified X2. (L) 10/14/2017   GLUCOSE 85 10/14/2017   CHOL 85 10/14/2017   TRIG 108.0 10/14/2017   HDL 24.10 (L) 10/14/2017   LDLDIRECT 128.7 01/04/2009   LDLCALC 40 10/14/2017   ALT 27 10/14/2017   AST 24 10/14/2017   NA 141 10/14/2017   K 4.0 10/14/2017   CL 104 10/14/2017   CREATININE 0.86 10/14/2017   BUN 15 10/14/2017   CO2 29 10/14/2017   TSH 8.03 (H) 10/14/2017   PSA 0.46 10/14/2017   INR 1.1 05/01/2009    ASSESSMENT AND PLAN:  Discussed the following assessment and plan:  Visit for preventive health examination  Hyperlipidemia, unspecified hyperlipidemia type - taking 10 mg crestor  Medication management  Monocytosis relative   Elevated TSH - cw subclinical  finding   may add medication if confrimed  neg fam hx   - Plan: TSH, T4, free, Thyroid antibodies  Thrombocytopenia (HCC) - stable no bleeding  Atherosclerosis of native coronary artery of native heart without angina pectoris Stable an doing well.  tsh rising with nl free t4 neg fam hx and  exam  Disc poss of adding low dose thyroid med  But would want  To get antibody levels   And repeat  First     Patient Care Team: Burnis Medin, MD as PCP - General Troy Sine, MD (Cardiology) Calvert Cantor, MD as Consulting Physician (Ophthalmology) Jari Pigg, MD as Consulting Physician (Dermatology) Richmond Campbell, MD as Consulting Physician (Gastroenterology)  Patient Instructions  Glad you are doing well.  Continue lifestyle intervention healthy eating and exercise .  Plan blood test   For thyroid in  1 month  May consider adding medication  If still off. As we discussed .   Otherwise    Yearly check up and   Follow  Blood count every 6-12 months      Health Maintenance, Male A healthy lifestyle and preventive care is important for your health and wellness. Ask your health care provider about what schedule of regular examinations is right for you. What should I know about weight and diet? Eat a Healthy Diet  Eat plenty of vegetables, fruits, whole grains, low-fat dairy products, and lean protein.  Do not eat a lot of foods high in solid fats, added sugars, or salt.  Maintain a Healthy Weight Regular exercise can help you achieve or maintain a healthy weight. You should:  Do at least 150 minutes of exercise each week. The exercise should increase your heart rate and make you sweat (moderate-intensity exercise).  Do strength-training exercises at least twice a week.  Watch Your Levels of Cholesterol and Blood Lipids  Have your blood tested for lipids and cholesterol every 5 years starting at 67 years of age. If you are at high risk for heart disease, you should start having your blood tested when you are 67 years old. You may need to have your cholesterol levels checked more often if: ? Your lipid or cholesterol levels are high. ? You are  older than 67 years of age. ? You are at high risk for heart disease.  What should I know about cancer screening? Many types  of cancers can be detected early and may often be prevented. Lung Cancer  You should be screened every year for lung cancer if: ? You are a current smoker who has smoked for at least 30 years. ? You are a former smoker who has quit within the past 15 years.  Talk to your health care provider about your screening options, when you should start screening, and how often you should be screened.  Colorectal Cancer  Routine colorectal cancer screening usually begins at 67 years of age and should be repeated every 5-10 years until you are 67 years old. You may need to be screened more often if early forms of precancerous polyps or small growths are found. Your health care provider may recommend screening at an earlier age if you have risk factors for colon cancer.  Your health care provider may recommend using home test kits to check for hidden blood in the stool.  A small camera at the end of a tube can be used to examine your colon (sigmoidoscopy or colonoscopy). This checks for the earliest forms of colorectal cancer.  Prostate and Testicular Cancer  Depending on your age and overall health, your health care provider may do certain tests to screen for prostate and testicular cancer.  Talk to your health care provider about any symptoms or concerns you have about testicular or prostate cancer.  Skin Cancer  Check your skin from head to toe regularly.  Tell your health care provider about any new moles or changes in moles, especially if: ? There is a change in a mole's size, shape, or color. ? You have a mole that is larger than a pencil eraser.  Always use sunscreen. Apply sunscreen liberally and repeat throughout the day.  Protect yourself by wearing long sleeves, pants, a wide-brimmed hat, and sunglasses when outside.  What should I know about heart disease, diabetes, and high blood pressure?  If you are 64-2 years of age, have your blood pressure checked every 3-5 years. If you  are 51 years of age or older, have your blood pressure checked every year. You should have your blood pressure measured twice-once when you are at a hospital or clinic, and once when you are not at a hospital or clinic. Record the average of the two measurements. To check your blood pressure when you are not at a hospital or clinic, you can use: ? An automated blood pressure machine at a pharmacy. ? A home blood pressure monitor.  Talk to your health care provider about your target blood pressure.  If you are between 54-23 years old, ask your health care provider if you should take aspirin to prevent heart disease.  Have regular diabetes screenings by checking your fasting blood sugar level. ? If you are at a normal weight and have a low risk for diabetes, have this test once every three years after the age of 72. ? If you are overweight and have a high risk for diabetes, consider being tested at a younger age or more often.  A one-time screening for abdominal aortic aneurysm (AAA) by ultrasound is recommended for men aged 12-75 years who are current or former smokers. What should I know about preventing infection? Hepatitis B If you have a higher risk for hepatitis B, you should be screened for this virus. Talk with your  health care provider to find out if you are at risk for hepatitis B infection. Hepatitis C Blood testing is recommended for:  Everyone born from [birth year] through 1965.  Anyone with known risk factors for hepatitis C.  Sexually Transmitted Diseases (STDs)  You should be screened each year for STDs including gonorrhea and chlamydia if: ? You are sexually active and are younger than 67 years of age. ? You are older than 67 years of age and your health care provider tells you that you are at risk for this type of infection. ? Your sexual activity has changed since you were last screened and you are at an increased risk for chlamydia or gonorrhea. Ask your health care provider if  you are at risk.  Talk with your health care provider about whether you are at high risk of being infected with HIV. Your health care provider may recommend a prescription medicine to help prevent HIV infection.  What else can I do?  Schedule regular health, dental, and eye exams.  Stay current with your vaccines (immunizations).  Do not use any tobacco products, such as cigarettes, chewing tobacco, and e-cigarettes. If you need help quitting, ask your health care provider.  Limit alcohol intake to no more than 2 drinks per day. One drink equals 12 ounces of beer, 5 ounces of wine, or 1 ounces of hard liquor.  Do not use street drugs.  Do not share needles.  Ask your health care provider for help if you need support or information about quitting drugs.  Tell your health care provider if you often feel depressed.  Tell your health care provider if you have ever been abused or do not feel safe at home. This information is not intended to replace advice given to you by your health care provider. Make sure you discuss any questions you have with your health care provider. Document Released: 02/15/2008 Document Revised: 04/17/2016 Document Reviewed: 05/23/2015 Elsevier Interactive Patient Education  2018 [place]. Kathee Tumlin M.D.

## 2017-10-20 ENCOUNTER — Ambulatory Visit (INDEPENDENT_AMBULATORY_CARE_PROVIDER_SITE_OTHER): Payer: Medicare Other | Admitting: Internal Medicine

## 2017-10-20 ENCOUNTER — Telehealth: Payer: Self-pay | Admitting: Cardiovascular Disease

## 2017-10-20 ENCOUNTER — Encounter: Payer: Self-pay | Admitting: Internal Medicine

## 2017-10-20 VITALS — BP 124/70 | HR 72 | Temp 97.7°F | Ht 71.0 in | Wt 218.2 lb

## 2017-10-20 DIAGNOSIS — D696 Thrombocytopenia, unspecified: Secondary | ICD-10-CM | POA: Diagnosis not present

## 2017-10-20 DIAGNOSIS — D72821 Monocytosis (symptomatic): Secondary | ICD-10-CM | POA: Diagnosis not present

## 2017-10-20 DIAGNOSIS — R7989 Other specified abnormal findings of blood chemistry: Secondary | ICD-10-CM | POA: Insufficient documentation

## 2017-10-20 DIAGNOSIS — Z0000 Encounter for general adult medical examination without abnormal findings: Secondary | ICD-10-CM | POA: Diagnosis not present

## 2017-10-20 DIAGNOSIS — I2510 Atherosclerotic heart disease of native coronary artery without angina pectoris: Secondary | ICD-10-CM | POA: Diagnosis not present

## 2017-10-20 DIAGNOSIS — E785 Hyperlipidemia, unspecified: Secondary | ICD-10-CM | POA: Diagnosis not present

## 2017-10-20 DIAGNOSIS — Z79899 Other long term (current) drug therapy: Secondary | ICD-10-CM

## 2017-10-20 MED ORDER — NITROGLYCERIN 0.4 MG SL SUBL: 0.4000 mg | SUBLINGUAL_TABLET | SUBLINGUAL | 6 refills | Status: DC | PRN

## 2017-10-20 NOTE — Patient Instructions (Addendum)
Glad you are doing well.  Continue lifestyle intervention healthy eating and exercise .  Plan blood test   For thyroid in  1 month  May consider adding medication  If still off. As we discussed .   Otherwise    Yearly check up and   Follow  Blood count every 6-12 months      Health Maintenance, Male A healthy lifestyle and preventive care is important for your health and wellness. Ask your health care provider about what schedule of regular examinations is right for you. What should I know about weight and diet? Eat a Healthy Diet  Eat plenty of vegetables, fruits, whole grains, low-fat dairy products, and lean protein.  Do not eat a lot of foods high in solid fats, added sugars, or salt.  Maintain a Healthy Weight Regular exercise can help you achieve or maintain a healthy weight. You should:  Do at least 150 minutes of exercise each week. The exercise should increase your heart rate and make you sweat (moderate-intensity exercise).  Do strength-training exercises at least twice a week.  Watch Your Levels of Cholesterol and Blood Lipids  Have your blood tested for lipids and cholesterol every 5 years starting at 67 years of age. If you are at high risk for heart disease, you should start having your blood tested when you are 67 years old. You may need to have your cholesterol levels checked more often if: ? Your lipid or cholesterol levels are high. ? You are older than 67 years of age. ? You are at high risk for heart disease.  What should I know about cancer screening? Many types of cancers can be detected early and may often be prevented. Lung Cancer  You should be screened every year for lung cancer if: ? You are a current smoker who has smoked for at least 30 years. ? You are a former smoker who has quit within the past 15 years.  Talk to your health care provider about your screening options, when you should start screening, and how often you should be  screened.  Colorectal Cancer  Routine colorectal cancer screening usually begins at 67 years of age and should be repeated every 5-10 years until you are 67 years old. You may need to be screened more often if early forms of precancerous polyps or small growths are found. Your health care provider may recommend screening at an earlier age if you have risk factors for colon cancer.  Your health care provider may recommend using home test kits to check for hidden blood in the stool.  A small camera at the end of a tube can be used to examine your colon (sigmoidoscopy or colonoscopy). This checks for the earliest forms of colorectal cancer.  Prostate and Testicular Cancer  Depending on your age and overall health, your health care provider may do certain tests to screen for prostate and testicular cancer.  Talk to your health care provider about any symptoms or concerns you have about testicular or prostate cancer.  Skin Cancer  Check your skin from head to toe regularly.  Tell your health care provider about any new moles or changes in moles, especially if: ? There is a change in a mole's size, shape, or color. ? You have a mole that is larger than a pencil eraser.  Always use sunscreen. Apply sunscreen liberally and repeat throughout the day.  Protect yourself by wearing long sleeves, pants, a wide-brimmed hat, and sunglasses when outside.  What should I know about heart disease, diabetes, and high blood pressure?  If you are 37-6 years of age, have your blood pressure checked every 3-5 years. If you are 47 years of age or older, have your blood pressure checked every year. You should have your blood pressure measured twice-once when you are at a hospital or clinic, and once when you are not at a hospital or clinic. Record the average of the two measurements. To check your blood pressure when you are not at a hospital or clinic, you can use: ? An automated blood pressure machine at a  pharmacy. ? A home blood pressure monitor.  Talk to your health care provider about your target blood pressure.  If you are between 58-52 years old, ask your health care provider if you should take aspirin to prevent heart disease.  Have regular diabetes screenings by checking your fasting blood sugar level. ? If you are at a normal weight and have a low risk for diabetes, have this test once every three years after the age of 73. ? If you are overweight and have a high risk for diabetes, consider being tested at a younger age or more often.  A one-time screening for abdominal aortic aneurysm (AAA) by ultrasound is recommended for men aged 23-75 years who are current or former smokers. What should I know about preventing infection? Hepatitis B If you have a higher risk for hepatitis B, you should be screened for this virus. Talk with your health care provider to find out if you are at risk for hepatitis B infection. Hepatitis C Blood testing is recommended for:  Everyone born from [birth year] through 1965.  Anyone with known risk factors for hepatitis C.  Sexually Transmitted Diseases (STDs)  You should be screened each year for STDs including gonorrhea and chlamydia if: ? You are sexually active and are younger than 67 years of age. ? You are older than 67 years of age and your health care provider tells you that you are at risk for this type of infection. ? Your sexual activity has changed since you were last screened and you are at an increased risk for chlamydia or gonorrhea. Ask your health care provider if you are at risk.  Talk with your health care provider about whether you are at high risk of being infected with HIV. Your health care provider may recommend a prescription medicine to help prevent HIV infection.  What else can I do?  Schedule regular health, dental, and eye exams.  Stay current with your vaccines (immunizations).  Do not use any tobacco products, such as  cigarettes, chewing tobacco, and e-cigarettes. If you need help quitting, ask your health care provider.  Limit alcohol intake to no more than 2 drinks per day. One drink equals 12 ounces of beer, 5 ounces of wine, or 1 ounces of hard liquor.  Do not use street drugs.  Do not share needles.  Ask your health care provider for help if you need support or information about quitting drugs.  Tell your health care provider if you often feel depressed.  Tell your health care provider if you have ever been abused or do not feel safe at home. This information is not intended to replace advice given to you by your health care provider. Make sure you discuss any questions you have with your health care provider. Document Released: 02/15/2008 Document Revised: 04/17/2016 Document Reviewed: 05/23/2015 Elsevier Interactive Patient Education  [employer].

## 2017-10-20 NOTE — Telephone Encounter (Signed)
°*  STAT* If patient is at the pharmacy, call can be transferred to refill team.   1. Which medications need to be refilled? (please list name of each medication and dose if known)     nitroGLYCERIN (NITROSTAT) 0.4 MG SL tablet    2. Which pharmacy/location (including street and city if local pharmacy) is medication to be sent to? [facility] -[place], [address] [place] [address]. Do they need a 30 day or 90 day supply? 5

## 2017-12-02 ENCOUNTER — Other Ambulatory Visit (INDEPENDENT_AMBULATORY_CARE_PROVIDER_SITE_OTHER): Payer: Medicare Other

## 2017-12-02 DIAGNOSIS — R7989 Other specified abnormal findings of blood chemistry: Secondary | ICD-10-CM | POA: Diagnosis not present

## 2017-12-02 LAB — T4, FREE: Free T4: 1.16 ng/dL (ref 0.60–1.60)

## 2017-12-02 LAB — T3, FREE: T3, Free: 3.6 pg/mL (ref 2.3–4.2)

## 2017-12-02 LAB — TSH: TSH: 3.29 u[IU]/mL (ref 0.35–4.50)

## 2017-12-03 LAB — THYROID ANTIBODIES
Thyroglobulin Ab: <1 IU/mL (ref <=1)
Thyroperoxidase Ab SerPl-aCnc: <1 IU/mL (ref <9)

## 2017-12-18 ENCOUNTER — Ambulatory Visit (INDEPENDENT_AMBULATORY_CARE_PROVIDER_SITE_OTHER): Payer: Medicare Other | Admitting: Cardiovascular Disease

## 2017-12-18 ENCOUNTER — Encounter: Payer: Self-pay | Admitting: Cardiovascular Disease

## 2017-12-18 VITALS — BP 124/62 | HR 77 | Ht 72.0 in | Wt 222.6 lb

## 2017-12-18 DIAGNOSIS — G4733 Obstructive sleep apnea (adult) (pediatric): Secondary | ICD-10-CM

## 2017-12-18 DIAGNOSIS — I252 Old myocardial infarction: Secondary | ICD-10-CM

## 2017-12-18 DIAGNOSIS — E785 Hyperlipidemia, unspecified: Secondary | ICD-10-CM | POA: Diagnosis not present

## 2017-12-18 DIAGNOSIS — I2510 Atherosclerotic heart disease of native coronary artery without angina pectoris: Secondary | ICD-10-CM

## 2017-12-18 NOTE — Progress Notes (Signed)
Patient ID: Jon Carter, male   DOB: 1951-02-28, 67 y.o.   MRN: [id number]    Primary M.D.: Dr. Shanon Ace  HPI: Jon Carter is a 67 y.o. male who presents to the office today for a 6 month cardiology evaluation.  Jon Carter presented with an acute coronary syndrome/ST segment elevation myocardial infarction on 04/26/2009. I took him acutely to the cardiac catheterization laboratory where he was found to have total occlusion of his mid circumflex coronary artery. He had an excellent DTB time of less than 40 minutes and ultimately had a Xience DES 2.75x18 mm stent inserted into the circumflex, postdilated to 3.0 mm. Subsequently, he has been demonstrated to have complete salvage of myocardium. Initially, he had significant anxiety issues following his acute coronary syndrome. Ultimately, this has resolved.  He had worked as an Air traffic controller.  Typically he is off the summer.  For the last several years he has been traveling extensively to both [place] as well as [place] and [place].  He remains active.  He denies any episodes of chest pain.  He continues to exercise at least 5 days per week at the gym.  He denies PND, orthopnea.  He is resolved.    His last stress test  in August 2013 continued to show normal perfusion.  In the past, he had some issues with mild thrombocytopenia.  He has been maintained on Crestor 10 mg daily.  He is on aspirin.  He is no longer taking any beta blocker or ACE inhibitor. Lipid studies in January 2017 were excellent with a total cholesterol 99, LDL 45, VLDL 23, and triglycerides 116.  HDL was low at 30.  Renal function was normal.  Transaminases were mildly elevated with an ALT of 61 and AST normal at 37.   Since I last saw him he traveled to [place] and [name] last summer.  He has been without chest pian or exertional symptoms. He will be retiing in May.  He had recent lab work by Dr. Regis Bill. He has had a h/o reduced platelets, but these were now 72K on  10/09/16. TC was 100; LDL 40 but LDL was low at 23.  LFTs were now normal.  He denies 0chest pain. He works out at [employer] at least 3 -5 days per week doing elliptical workouts.  He will be traveling to [place] and [place] this summer.  His wife had noticed that he was snoring loudly and had  potential apneic events while he has been sleeping.  He felt that he has slept relatively well, but he would wake up 2 times per night to go to the bathroom.  He denied any excessive daytime sleepiness.  An Epworth Sleepiness Scale score was calculated as shown below:   Epworth Sleepiness Scale: Situation   Chance of Dozing/Sleeping (0 = never , 1 = slight chance , 2 = moderate chance , 3 = high chance )   sitting and reading 0   watching TV 1   sitting inactive in a public place 0   being a passenger in a motor vehicle for an hour or more 1   lying down in the afternoon 3   sitting and talking to someone 0   sitting quietly after lunch (no alcohol) 0   while stopped for a few minutes in traffic as the driver 0   Total Score  5   He underwent a sleep study on 12/02/2016 , Which demonstrated mild obstructive sleep apnea overall  with an AHI of 5.4 per hour.  However, his respiratory disturbance index was moderately abnormal at 23.8 per hour.  He had mild-to-moderate sleep apnea during REM sleep with an AHI of 14 per hour and RDI of 21.9 per hour.  There was mild oxygen desaturation to a nadir of 87%.  He snored loudly.   At that office visit, he was about to go to [place] for 2 months.  We discussed CPAP versus customized dental appliance. I  referred him to to Dr. Oneal Grout , and he was fitted with a by his appliance that he was able to take to [place] and use.  He has noticed that his sleep is improved.  He is snoring has significantly improved.  His sleep is more restorative.  A recent ApneicLink from 06/11/2017 showed an apneic hypopnea index of 3.5 overall and 3.9 with supine sleep.  Average oxygen saturation was  93% with the lowest at 88%. Dr. Ron Parker has done additional mild tweaking to his oral appliance.  He has undergone  a Hematology evaluation at [facility] for his chronic thrombocytopenia and monocytosis , which appear stable.  Since his return from [place], he began to exercise more vigorously and began to notice some left upper quadrant-like discomfort.  He is concerned perhaps this may be his heart.  However, he was diagnosed with costochondritis and his symptoms completely resolved.  Now that he is retired, he has more time off.   Since I last saw him in October 2018, he traveled to [place] and recently was in [place].  He has continued to feel well.  He denies recurrent chest pain.  He remains active.  He denies PND orthopnea.  He denies shortness of breath.  He is sleeping better with his oral appliance then prior to institution.  He presents for evaluation  Past Medical History:  Diagnosis Date  . Adjustment disorder with anxiety 06/13/2009   Qualifier: Diagnosis of  By: Regis Bill MD, Standley Brooking  related to his sudden MI underwent counseling takes low-dose medication as needed had side effects from an SSRI   . Alcohol addiction ([place])   . Hyperlipidemia    Hx of low HDL zero CAC in 2000 at [place] [facility]  . Low testosterone   . STEMI (ST elevation myocardial infarction) ([place]) 04/26/2009   left circumflex DES     Past Surgical History:  Procedure Laterality Date  . CARDIAC CATHETERIZATION  05/02/2009   UA - patent Cfx stent, 50-60% LAD, 50-60% RCA with normal LV funtion (Dr. Adora Fridge)  . CATARACT EXTRACTION Left 2009   [facility]  . CORONARY ANGIOPLASTY WITH STENT PLACEMENT  04/26/2009   ACS/STEMI - total occlusion of mid Cfx - Xience DES 2.75x34m (Dr. TCorky Downs  . NM MYOCAR PERF WALL MOTION  04/2012   bruce myoview; mild perfusion defect in basal inferior region (infarct/scar or overlying attenuation); post-stress EF 53%, EKG negative for ischemia, hypertensive response to exercise; abnormal study but unchanged  from previous study  . TRANSTHORACIC ECHOCARDIOGRAM  05/2009   EF=50-55%; trace MR; mild TR, normal RSVP; AV mildly sclerotic    No Known Allergies  Current Outpatient Medications  Medication Sig Dispense Refill  . aspirin 81 MG tablet Take 81 mg by mouth daily.      . Coenzyme Q10 150 MG CAPS Take by mouth.      . nitroGLYCERIN (NITROSTAT) 0.4 MG SL tablet Place 1 tablet (0.4 mg total) under the tongue every 5 (five) minutes as needed. 5 tablet  6  . rosuvastatin (CRESTOR) 20 MG tablet TAKE 1 TABLET BY MOUTH ONCE DAILY 90 tablet 3   No current facility-administered medications for this visit.     Social History   Socioeconomic History  . Marital status: Married    Spouse name: Not on file  . Number of children: Not on file  . Years of education: Not on file  . Highest education level: Not on file  Occupational History  . Occupation: phd Copywriter, advertising: [employer]  . Financial resource strain: Not on file  . Food insecurity:    Worry: Not on file    Inability: Not on file  . Transportation needs:    Medical: Not on file    Non-medical: Not on file  Tobacco Use  . Smoking status: Never Smoker  . Smokeless tobacco: Never Used  Substance and Sexual Activity  . Alcohol use: Yes    Alcohol/week: 1.0 oz    Types: 2 Standard drinks or equivalent per week  . Drug use: No  . Sexual activity: Not on file  Lifestyle  . Physical activity:    Days per week: Not on file    Minutes per session: Not on file  . Stress: Not on file  Relationships  . Social connections:    Talks on phone: Not on file    Gets together: Not on file    Attends religious service: Not on file    Active member of club or organization: Not on file    Attends meetings of clubs or organizations: Not on file    Relationship status: Not on file  . Intimate partner violence:    Fear of current or ex partner: Not on file    Emotionally abused: Not on file    Physically abused:  Not on file    Forced sexual activity: Not on file  Other Topics Concern  . Not on file  Social History Narrative   Designated Party Release signed on 02/05/2010     [phone]    married works at [employer] retired Therapist, art reserve PhD Surveyor, quantity   never smoked regular exercise household  3    no pets   2-3 beverages per day wine      Retired may 18            Socially he is married to my patient Ms. [employer].  He is retired from [employer] after being in [employer] for 30 years.  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  He he has the summer off and typically travels abroad and remains active. Marland Kitchen  He completely retired in May 2018.  He exercises daily. There is no tobacco use.  ROS General: Negative; No fevers, chills, or night sweats;  HEENT: Negative; No changes in vision or hearing, sinus congestion, difficulty swallowing Pulmonary: Negative; No cough, wheezing, shortness of breath, hemoptysis Cardiovascular:  No recurrent anginal symptoms.  No PND, orthopnea.  No palpitations GI: Negative; No nausea, vomiting, diarrhea, or abdominal pain GU: Negative; No dysuria, hematuria, or difficulty voiding Musculoskeletal: Negative; no myalgias, joint pain, or weakness Hematologic/Oncology: Negative; no easy bruising, bleeding Endocrine: Negative; no heat/cold intolerance; no diabetes Neuro: Negative; no changes in balance, headaches Skin: Negative; No rashes or skin lesions Psychiatric: Negative; No behavioral problems, depression Sleep:Positive for OSA, now with a customized oral dental appliance; no daytime sleepiness, hypersomnolence, bruxism, restless legs, hypnogognic hallucinations, no  cataplexy Other comprehensive 14 point system review is negative.   PE BP 124/62   Pulse 77   Ht 6' (1.829 m)   Wt 222 lb 9.6 oz (101 kg)   BMI 30.19 kg/m    .  Repeat blood pressure was 126/64  Wt Readings from Last 3 Encounters:  12/18/17 222 lb 9.6 oz (101 kg)  10/20/17  218 lb 3.2 oz (99 kg)  06/19/17 220 lb (99.8 kg)   General: Alert, oriented, no distress.  Skin: normal turgor, no rashes, warm and dry HEENT: Normocephalic, atraumatic. Pupils equal round and reactive to light; sclera anicteric; extraocular muscles intact;  Nose without nasal septal hypertrophy Mouth/Parynx benign; Mallinpatti scale 3 Neck: No JVD, no carotid bruits; normal carotid upstroke Lungs: clear to ausculatation and percussion; no wheezing or rales Chest wall: without tenderness to palpitation Heart: PMI not displaced, RRR, s1 s2 normal, 1/6 systolic murmur, no diastolic murmur, no rubs, gallops, thrills, or heaves Abdomen: soft, nontender; no hepatosplenomehaly, BS+; abdominal aorta nontender and not dilated by palpation. Back: no CVA tenderness Pulses 2+ Musculoskeletal: full range of motion, normal strength, no joint deformities Extremities: no clubbing cyanosis or edema, Homan's sign negative  Neurologic: grossly nonfocal; Cranial nerves grossly wnl Psychologic: Normal mood and affect   ECG (independently read by me): normal sinus rhythm at 77 bpm.  No ectopy.  Normal intervals.  October 2018 ECG (independently read by me): Normal sinus rhythm at 73 bpm.  No ectopy.  Normal intervals.  May 2018 ECG (independently read by me): Normal sinus rhythm at 76 bpm.  Normal intervals.  No ST segment changes.  February 2018 ECG (independently read by me): Normal sinus rhythm at 68 bpm with mild sinus arrhythmia.  Isolated PVC.  Normal intervals.  April 2017 ECG (independently read by me): Normal sinus rhythm at 93 bpm.  No ectopy.  Normal intervals.  No ECG evidence of prior MI.  April 2016 ECG (independently read by me): Normal sinus rhythm at 64.  No ectopy.  Normal intervals.  January 2015 ECG (independently interpreted by me): Normal sinus rhythm at 71 beats per minute. No significant ST changes. Normal intervals.  Last ECG of 03/26/2013: Normal sinus rhythm at 71 beats per  minute. Normal intervals.  LABS:  BMP Latest Ref Rng & Units 10/14/2017 10/08/2016 10/03/2015  Glucose 70 - 99 mg/dL 85 99 93  BUN 6 - 23 mg/dL '15 17 13  '$ Creatinine 0.40 - 1.50 mg/dL 0.86 0.87 0.79  Sodium 135 - 145 mEq/L 141 139 141  Potassium 3.5 - 5.1 mEq/L 4.0 4.0 4.4  Chloride 96 - 112 mEq/L 104 106 104  CO2 19 - 32 mEq/L 29 28 32  Calcium 8.4 - 10.5 mg/dL 9.1 9.0 9.4    Hepatic Function Latest Ref Rng & Units 10/14/2017 10/08/2016 04/12/2016  Total Protein 6.0 - 8.3 g/dL 7.9 7.8 7.8  Albumin 3.5 - 5.2 g/dL 4.2 4.2 4.2  AST 0 - 37 U/L 24 35 35  ALT 0 - 53 U/L 27 47 47  Alk Phosphatase 39 - 117 U/L 37(L) 33(L) 39  Total Bilirubin 0.2 - 1.2 mg/dL 0.6 0.5 0.5  Bilirubin, Direct 0.0 - 0.3 mg/dL 0.1 0.1 0.1    CBC Latest Ref Rng & Units 10/14/2017 12/30/2016 10/08/2016  WBC 4.0 - 10.5 K/uL 5.5 4.8 4.8  Hemoglobin 13.0 - 17.0 g/dL 14.8 13.7 14.6  Hematocrit 39.0 - 52.0 % 43.9 44 43.0  Platelets 150.0 - 400.0 K/uL 73.0 Repeated and verified X2.(L)  69(A) 72.0 Repeated and verified X2.(L)   Lab Results  Component Value Date   TSH 3.29 12/02/2017    BNP    Component Value Date/Time   PROBNP 99.0 04/28/2009 0420    Lipid Panel     Component Value Date/Time   CHOL 85 10/14/2017 0815   TRIG 108.0 10/14/2017 0815   TRIG 347 (HH) 07/17/2006 1010   HDL 24.10 (L) 10/14/2017 0815   CHOLHDL 4 10/14/2017 0815   VLDL 21.6 10/14/2017 0815   LDLCALC 40 10/14/2017 0815    RADIOLOGY: No results found.  IMPRESSION:  1. CAD in native artery   2. Old MI (myocardial infarction)   3. OSA (obstructive sleep apnea)   4. Hyperlipidemia with target LDL less than 70     ASSESSMENT AND PLAN: Jon Carter is a 67 year old white male who continues to do well  9years following his acute coronary syndrome when he presented with left circumflex occlusion and underwent successful  early revascularization. He  has had complete salvage of myocardium verified by nuclear imaging.  Presently, he continues  to do well and is not having any recurrent anginal symptoms and continues to experience excellent exercise program of exercises 5-6 days per week.  He has traveled to [place] as well as to [place] and [place] where he is hiked vigorously without recurrent symptomatology.  He retired in May 2018 . Marland Kitchen  He has continued to travel.  He denies recurrent anginal symptoms.  His blood pressure is stable.  He continues to be on rosuvastatin 20 mg for hyperlipidemia  In February 2019.  LDL cholesterol was excellent at 40 and triglycerides were 108.  He is eating well.  He is sleeping better since initiation of the oral appliance for his obstructive sleep apnea.  There are rare instances where he may not sleep as well.  He has undergone several tweaking of his appliance by Dr. Ron Parker.  He continues to be on aspirin for his CAD.  Thyroid function studies are normal, and recent laboratory was reviewed.  As long as he remains stable, will continue his current medical regimen.  I will see him in 6 months for reevaluation.   Troy Sine, MD, [facility]  12/19/2017 6:14 PM

## 2017-12-18 NOTE — Patient Instructions (Signed)
Medication Instructions:  Your physician recommends that you continue on your current medications as directed. Please refer to the Current Medication list given to you today.  Follow-Up: Your physician wants you to follow-up in: 6 months with Dr. Kelly.  You will receive a reminder letter in the mail two months in advance. If you don't receive a letter, please call our office to schedule the follow-up appointment.      If you need a refill on your cardiac medications before your next appointment, please call your pharmacy.   

## 2017-12-19 ENCOUNTER — Encounter: Payer: Self-pay | Admitting: Cardiovascular Disease

## 2018-04-03 ENCOUNTER — Ambulatory Visit: Payer: Medicare Other | Admitting: Internal Medicine

## 2018-06-12 ENCOUNTER — Ambulatory Visit: Payer: Medicare Other | Admitting: Cardiovascular Disease

## 2018-07-27 ENCOUNTER — Emergency Department (HOSPITAL_COMMUNITY)
Admission: EM | Admit: 2018-07-27 | Discharge: 2018-07-27 | Disposition: A | Discharge status: Home / Self Care | Payer: Medicare Other | Attending: Emergency Medicine | Admitting: Emergency Medicine

## 2018-07-27 ENCOUNTER — Encounter (HOSPITAL_COMMUNITY): Payer: Self-pay | Admitting: Emergency Medicine

## 2018-07-27 DIAGNOSIS — Z7982 Long term (current) use of aspirin: Secondary | ICD-10-CM | POA: Diagnosis not present

## 2018-07-27 DIAGNOSIS — R040 Epistaxis: Secondary | ICD-10-CM

## 2018-07-27 DIAGNOSIS — Z79899 Other long term (current) drug therapy: Secondary | ICD-10-CM | POA: Diagnosis not present

## 2018-07-27 MED ORDER — OXYMETAZOLINE HCL 0.05 % NA SOLN
1.0000 | Freq: Once | NASAL | Status: AC
  Administered 2018-07-27: 1 via NASAL
  Filled 2018-07-27: qty 15

## 2018-07-27 MED ORDER — SILVER NITRATE-POT NITRATE 75-25 % EX MISC
1.0000 "application " | Freq: Once | CUTANEOUS | Status: AC
  Administered 2018-07-27: 1 via TOPICAL
  Filled 2018-07-27: qty 1

## 2018-07-27 NOTE — ED Provider Notes (Signed)
[facility] EMERGENCY DEPARTMENT Provider Note   CSN: [id number] Arrival date & time: 07/27/18  1414     History   Chief Complaint Chief Complaint  Patient presents with  . Epistaxis    HPI Jon Carter is a 67 y.o. male.  HPI   He presents for evaluation of nosebleeding which started today and is persistent.  He is using tissues and blowing his nose to help the bleeding.  He has similar episode of bleeding last week and started using a humidifier which seemed to help.  He takes "baby aspirin," as a cardiac preventive.  He does not take anticoagulants.  He denies nausea, vomiting, weakness or dizziness.  The bleeding is from the right nares, and he is getting some blood.  Past Medical History:  Diagnosis Date  . Adjustment disorder with anxiety 06/13/2009   Qualifier: Diagnosis of  By: Regis Bill MD, Standley Brooking  related to his sudden MI underwent counseling takes low-dose medication as needed had side effects from an SSRI   . Alcohol addiction ([place])   . Hyperlipidemia    Hx of low HDL zero CAC in 2000 at [facility]  . Low testosterone   . STEMI (ST elevation myocardial infarction) ([place]) 04/26/2009   left circumflex DES     Patient Active Problem List   Diagnosis Date Noted  . Elevated TSH 10/20/2017  . Monocytosis relative  10/20/2017  . Elevated LFTs 12/16/2015  . Testicular nodule vs cyst 12/10/2013  . Visit for preventive health examination 04/29/2012  . Leukopenia 05/14/2011  . Rosacea 05/14/2011  . Unspecified adverse effect of unspecified drug, medicinal and biological substance 10/19/2010  . Thrombocytopenia ([place]) 10/12/2010  . Hyperlipidemia   . HYPOGONADISM 02/05/2010  . Coronary atherosclerosis 06/13/2009  . POSTSURG PERCUT TRANSLUMINAL COR ANGPLSTY STS 06/13/2009  . ERECTILE DYSFUNCTION, MILD 06/29/2008  . HYPERTRIGLYCERIDEMIA 06/23/2007  . HYPERLIPIDEMIA 06/23/2007  . LOW HDL 06/23/2007    Past Surgical History:  Procedure Laterality Date   . CARDIAC CATHETERIZATION  05/02/2009   UA - patent Cfx stent, 50-60% LAD, 50-60% RCA with normal LV funtion (Dr. Adora Fridge)  . CATARACT EXTRACTION Left 2009   [facility]  . CORONARY ANGIOPLASTY WITH STENT PLACEMENT  04/26/2009   ACS/STEMI - total occlusion of mid Cfx - Xience DES 2.75x81mm (Dr. Corky Downs)  . NM MYOCAR PERF WALL MOTION  04/2012   bruce myoview; mild perfusion defect in basal inferior region (infarct/scar or overlying attenuation); post-stress EF 53%, EKG negative for ischemia, hypertensive response to exercise; abnormal study but unchanged from previous study  . TRANSTHORACIC ECHOCARDIOGRAM  05/2009   EF=50-55%; trace MR; mild TR, normal RSVP; AV mildly sclerotic        Home Medications    Prior to Admission medications   Medication Sig Start Date End Date Taking? Authorizing Provider  aspirin 81 MG tablet Take 81 mg by mouth daily.     Yes [provider]  Coenzyme Q10 150 MG CAPS Take 150 mg by mouth daily.    Yes [provider]  nitroGLYCERIN (NITROSTAT) 0.4 MG SL tablet Place 1 tablet (0.4 mg total) under the tongue every 5 (five) minutes as needed. 10/20/17  Yes Troy Sine, MD  rosuvastatin (CRESTOR) 20 MG tablet TAKE 1 TABLET BY MOUTH ONCE DAILY Patient taking differently: Take 20 mg by mouth daily.  09/01/17  Yes Troy Sine, MD    Family History Family History  Problem Relation Age of Onset  . Tongue  cancer Father        tobacco  . Throat cancer Father   . Cirrhosis Father        also heart problems from etoh   . Hypertension Mother   . Heart Problems Unknown        mom  in 38s     Social History Social History   Tobacco Use  . Smoking status: Never Smoker  . Smokeless tobacco: Never Used  Substance Use Topics  . Alcohol use: Yes    Alcohol/week: 2.0 standard drinks    Types: 2 Standard drinks or equivalent per week  . Drug use: No     Allergies   Patient has no known allergies.   Review of Systems Review of  Systems  All other systems reviewed and are negative.    Physical Exam Updated Vital Signs BP 139/77 (BP Location: Left Arm)   Pulse 78   Temp (!) 97.5 F (36.4 C) (Oral)   Resp 18   Ht 6' (1.829 m)   Wt 102.1 kg   SpO2 96%   BMI 30.52 kg/m   Physical Exam  Constitutional: He is oriented to person, place, and time. He appears well-developed and well-nourished. No distress.  HENT:  Head: Normocephalic and atraumatic.  Right Ear: External ear normal.  Left Ear: External ear normal.  Bloody nasal tampon and right nares.  Small amount of blood in left nares.  No blood in oropharynx.  No facial deformity or injury.  Eyes: Pupils are equal, round, and reactive to light. Conjunctivae and EOM are normal.  Neck: Normal range of motion and phonation normal. Neck supple.  Cardiovascular: Normal rate.  Pulmonary/Chest: Effort normal. He exhibits no bony tenderness.  Musculoskeletal: Normal range of motion.  Neurological: He is alert and oriented to person, place, and time. No cranial nerve deficit or sensory deficit. He exhibits normal muscle tone. Coordination normal.  Skin: Skin is warm, dry and intact.  Psychiatric: He has a normal mood and affect. His behavior is normal. Judgment and thought content normal.  Nursing note and vitals reviewed.    ED Treatments / Results  Labs (all labs ordered are listed, but only abnormal results are displayed) Labs Reviewed - No data to display  EKG None  Radiology No results found.  Procedures .Epistaxis Management Date/Time: 07/27/2018 4:32 PM Performed by: Daleen Bo, MD Authorized by: Daleen Bo, MD   Consent:    Consent obtained:  Verbal   Risks discussed:  Bleeding, infection and pain   Alternatives discussed:  No treatment Procedure details:    Treatment site:  R anterior   Treatment method:  Silver nitrate   Treatment complexity:  Extensive   Treatment episode: recurring   Post-procedure details:    Assessment:   Bleeding stopped   Patient tolerance of procedure:  Tolerated well, no immediate complications Comments:     Blood from the right nares expelled with blowing.  Afrin instilled for hemostasis.  Suctioning used to clear remainder of blood from anterior nares.  Visualize bleeding site anterior nares, to 3 mm area, with mild venous bleeding.  Secondary treatment with silver nitrate required for bleeding beneath the medication application area.n        (including critical care time)  Medications Ordered in ED Medications  silver nitrate applicators applicator 1 application (1 application Topical Given by Other 07/27/18 1547)  oxymetazoline (AFRIN) 0.05 % nasal spray 1 spray (1 spray Each Nare Given 07/27/18 1555)     Initial  Impression / Assessment and Plan / ED Course  I have reviewed the triage vital signs and the nursing notes.  Pertinent labs & imaging results that were available during my care of the patient were reviewed by me and considered in my medical decision making (see chart for details).      Patient Vitals for the past 24 hrs:  BP Temp Temp src Pulse Resp SpO2 Height Weight  07/27/18 1559 139/77 - - 78 18 96 % - -  07/27/18 1418 (!) 151/91 (!) 97.5 F (36.4 C) Oral 88 18 98 % 6' (1.829 m) 102.1 kg    5:04 PM Reevaluation with update and discussion. After initial assessment and treatment, an updated evaluation reveals bleeding from right nares has been controlled.  Findings discussed and questions answered.Daleen Bo   Medical Decision Making: Right nares bleeding likely secondary to anterior abrasion.  Doubt significant blood loss, acute infectious process or posterior nasal bleed.  CRITICAL CARE-no Performed by: Daleen Bo  Nursing Notes Reviewed/ Care Coordinated Applicable Imaging Reviewed Interpretation of Laboratory Data incorporated into ED treatment  The patient appears reasonably screened and/or stabilized for discharge and I doubt any other medical  condition or other [facility] requiring further screening, evaluation, or treatment in the ED at this time prior to discharge.  Plan: Home Medications-continue usual medication, Afrin nasal spray twice daily; Home Treatments-nose care for epistaxis; return here if the recommended treatment, does not improve the symptoms; Recommended follow up-PCP and ENT follow-up as needed.   Final Clinical Impressions(s) / ED Diagnoses   Final diagnoses:  Epistaxis    ED Discharge Orders    None       Daleen Bo, MD 07/27/18 1708

## 2018-07-27 NOTE — Discharge Instructions (Addendum)
Return here if you are unable to control future episodes of nosebleeding with pressure on the nose for 1 hour.  Start treating the nosebleeding this evening with Afrin nasal spray to puffs in each nostril, twice a day.  Also use Vaseline on the inside of your nose, to improve moisturization, 3 or 4 times a day.  Avoid blowing your nose if possible.  Use Tylenol if needed for pain.

## 2018-07-27 NOTE — ED Triage Notes (Signed)
States blew nose at 11 oo this am and he went to urgent care and was sent here ,only on  Asa no other blood thinners

## 2018-07-27 NOTE — ED Triage Notes (Signed)
Pt had nosebleed last week and today beginning at 11. Pt takes 81 mg of aspirin. Pt does not wish to wear a gown or be on stretcher.

## 2018-07-27 NOTE — ED Notes (Signed)
Pt. Refusing gown at this time.

## 2018-07-27 NOTE — ED Notes (Signed)
Pt voices understanding of discharge instructions. In NAD. Ambulatory at departure.

## 2018-09-03 ENCOUNTER — Other Ambulatory Visit: Payer: Self-pay | Admitting: Cardiovascular Disease

## 2018-09-07 ENCOUNTER — Encounter

## 2018-09-07 ENCOUNTER — Ambulatory Visit (INDEPENDENT_AMBULATORY_CARE_PROVIDER_SITE_OTHER): Payer: Medicare Other | Admitting: Cardiovascular Disease

## 2018-09-07 ENCOUNTER — Encounter: Payer: Self-pay | Admitting: Cardiovascular Disease

## 2018-09-07 VITALS — BP 124/74 | HR 82 | Ht 72.0 in | Wt 224.0 lb

## 2018-09-07 DIAGNOSIS — I252 Old myocardial infarction: Secondary | ICD-10-CM

## 2018-09-07 DIAGNOSIS — I2510 Atherosclerotic heart disease of native coronary artery without angina pectoris: Secondary | ICD-10-CM | POA: Diagnosis not present

## 2018-09-07 DIAGNOSIS — G4733 Obstructive sleep apnea (adult) (pediatric): Secondary | ICD-10-CM | POA: Diagnosis not present

## 2018-09-07 DIAGNOSIS — D696 Thrombocytopenia, unspecified: Secondary | ICD-10-CM

## 2018-09-07 DIAGNOSIS — E785 Hyperlipidemia, unspecified: Secondary | ICD-10-CM | POA: Diagnosis not present

## 2018-09-07 NOTE — Progress Notes (Signed)
Patient ID: Jon Carter, male   DOB: 03/22/1951, 67 y.o.   MRN: [id number]    Primary M.D.: Jon Carter  HPI: Jon Carter is a 67 y.o. male who presents to the office today for an 8 month cardiology evaluation.  Jon Carter presented with an acute coronary syndrome/ST segment elevation myocardial infarction on 04/26/2009. I took him acutely to the cardiac catheterization laboratory where he was found to have total occlusion of his mid circumflex coronary artery. He had an excellent DTB time of less than 40 minutes and ultimately had a Xience DES 2.75x18 mm stent inserted into the circumflex, postdilated to 3.0 mm. Subsequently, he has been demonstrated to have complete salvage of myocardium. Initially, he had significant anxiety issues following his acute coronary syndrome. Ultimately, this has resolved.  He had worked as an aviator instructor.  Typically he is off the summer.  For the last several years he has been traveling extensively to both [place] as well as [place] and [place].  He remains active.  He denies any episodes of chest pain.  He continues to exercise at least 5 days per week at the gym.  He denies PND, orthopnea.  He is resolved.    His last stress test  in August 2013 continued to show normal perfusion.  In the past, he had some issues with mild thrombocytopenia.  He has been maintained on Crestor 10 mg daily.  He is on aspirin.  He is no longer taking any beta blocker or ACE inhibitor. Lipid studies in January 2017 were excellent with a total cholesterol 99, LDL 45, VLDL 23, and triglycerides 116.  HDL was low at 30.  Renal function was normal.  Transaminases were mildly elevated with an ALT of 61 and AST normal at 37.   Since I last saw him he traveled to [place] and [place] last summer.  He has been without chest pian or exertional symptoms. He will be retiing in May.  He had recent lab work by Dr. Panosh. He has had a h/o reduced platelets, but these were now 72K on  10/09/16. TC was 100; LDL 40 but LDL was low at 23.  LFTs were now normal.  He denies 0chest pain. He works out at the gym at least 3 -5 days per week doing elliptical workouts.  He will be traveling to [place] and [place] this summer.  His wife had noticed that he was snoring loudly and had potential apneic events while he has been sleeping.  He felt that he has slept relatively well, but he would wake up 2 times per night to go to the bathroom.  He denied any excessive daytime sleepiness.  An Epworth Sleepiness Scale score was calculated as shown below:   Epworth Sleepiness Scale: Situation   Chance of Dozing/Sleeping (0 = never , 1 = slight chance , 2 = moderate chance , 3 = high chance )   sitting and reading 0   watching TV 1   sitting inactive in a public place 0   being a passenger in a motor vehicle for an hour or more 1   lying down in the afternoon 3   sitting and talking to someone 0   sitting quietly after lunch (no alcohol) 0   while stopped for a few minutes in traffic as the driver 0   Total Score  5   He underwent a sleep study on 12/02/2016 which demonstrated mild obstructive sleep apnea overall with an   AHI of 5.4 per hour.  However, his respiratory disturbance index was moderately abnormal at 23.8 per hour.  He had mild-to-moderate sleep apnea during REM sleep with an AHI of 14 per hour and RDI of 21.9 per hour.  There was mild oxygen desaturation to a nadir of 87%.  He snored loudly.   At that office visit, he was about to go to [place] for 2 months.  We discussed CPAP versus customized dental appliance. I  referred him to to Jon Carter , and he was fitted with a by his appliance that he was able to take to [place] and use.  He has noticed that his sleep is improved.  He is snoring has significantly improved.  His sleep is more restorative.  A recent ApneicLink from 06/11/2017 showed an apneic hypopnea index of 3.5 overall and 3.9 with supine sleep.  Average oxygen saturation was 93%  with the lowest at 88%. Jon Carter has done additional mild tweaking to his oral appliance.  He has undergone  a Hematology evaluation at [facility] for his chronic thrombocytopenia and monocytosis , which appear stable.  Since his return from [place], he began to exercise more vigorously and began to notice some left upper quadrant-like discomfort.  He is concerned perhaps this may be his heart.  However, he was diagnosed with costochondritis and his symptoms completely resolved.  Now that he is retired, he has more time off.   I last saw him in April 2019 at which time he was doing well.  He has had at least 4 adjustments to his oral appliance device for his sleep apnea.  He still has some residual snoring and continues to work with Jon Carter with reference to this.  Presently he denies any episodes of chest tightness.  He exercises regularly and typically walks up to 4 miles per day.  He has been doing yoga.  He developed a persistent nosebleed leading to an ER evaluation on July 27, 2018.  He received treatment with silver nitrate following Afrin, suctioning of his right nares.  Laboratory was not done to assess his thrombocytopenia.  He was seen by ENT for follow-up evaluation and was stable.  He had developed flulike symptoms shortly after Christmas which lasted for approximately 6 days.  He is unaware of palpitations.  He will be seeing his primary doctor Jon Carter next month and complete laboratory will be obtained.  He presents for reevaluation.  Past Medical History:  Diagnosis Date  . Adjustment disorder with anxiety 06/13/2009   Qualifier: Diagnosis of  By: Jon Carter, Jon Carter  related to his sudden MI underwent counseling takes low-dose medication as needed had side effects from an SSRI   . Alcohol addiction ([facility])   . Hyperlipidemia    Hx of low HDL zero CAC in 2000 at [facility]  . Low testosterone   . STEMI (ST elevation myocardial infarction) ([facility]) 04/26/2009   left circumflex DES     Past  Surgical History:  Procedure Laterality Date  . CARDIAC CATHETERIZATION  05/02/2009   UA - patent Cfx stent, 50-60% LAD, 50-60% RCA with normal LV funtion (Dr. Adora Fridge)  . CATARACT EXTRACTION Left 2009   [facility]  . CORONARY ANGIOPLASTY WITH STENT PLACEMENT  04/26/2009   ACS/STEMI - total occlusion of mid Cfx - Xience DES 2.75x39m (Dr. TCorky Downs  . NM MYOCAR PERF WALL MOTION  04/2012   bruce myoview; mild perfusion defect in basal inferior region (infarct/scar or overlying attenuation);  post-stress EF 53%, EKG negative for ischemia, hypertensive response to exercise; abnormal study but unchanged from previous study  . TRANSTHORACIC ECHOCARDIOGRAM  05/2009   EF=50-55%; trace MR; mild TR, normal RSVP; AV mildly sclerotic    No Known Allergies  Current Outpatient Medications  Medication Sig Dispense Refill  . aspirin 81 MG tablet Take 81 mg by mouth daily.      . Coenzyme Q10 150 MG CAPS Take 150 mg by mouth daily.     . nitroGLYCERIN (NITROSTAT) 0.4 MG SL tablet Place 1 tablet (0.4 mg total) under the tongue every 5 (five) minutes as needed. 5 tablet 6  . rosuvastatin (CRESTOR) 20 MG tablet TAKE 1 TABLET BY MOUTH ONCE DAILY 90 tablet 0   No current facility-administered medications for this visit.     Social History   Socioeconomic History  . Marital status: Married    Spouse name: Not on file  . Number of children: Not on file  . Years of education: Not on file  . Highest education level: Not on file  Occupational History  . Occupation: phd teaching    Employer: [employer]  Social Needs  . Financial resource strain: Not on file  . Food insecurity:    Worry: Not on file    Inability: Not on file  . Transportation needs:    Medical: Not on file    Non-medical: Not on file  Tobacco Use  . Smoking status: Never Smoker  . Smokeless tobacco: Never Used  Substance and Sexual Activity  . Alcohol use: Yes    Alcohol/week: 2.0 standard drinks    Types: 2 Standard  drinks or equivalent per week  . Drug use: No  . Sexual activity: Not on file  Lifestyle  . Physical activity:    Days per week: Not on file    Minutes per session: Not on file  . Stress: Not on file  Relationships  . Social connections:    Talks on phone: Not on file    Gets together: Not on file    Attends religious service: Not on file    Active member of club or organization: Not on file    Attends meetings of clubs or organizations: Not on file    Relationship status: Not on file  . Intimate partner violence:    Fear of current or ex partner: Not on file    Emotionally abused: Not on file    Physically abused: Not on file    Forced sexual activity: Not on file  Other Topics Concern  . Not on file  Social History Narrative   Designated Party Release signed on 02/05/2010     [phone]    married works at [employer] retired [employer] PhD teaching  Aviation   never smoked regular exercise household  3    no pets   2-3 beverages per day wine      Retired may 18            Socially he is married to my patient Ms. Claudia Sadlowski.  He is retired from the [employer] after being in the [employer] for 30 years.  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  He he has the summer off and typically travels abroad and remains active. .  He completely retired in May 2018.  He exercises daily. There is no tobacco use.  ROS General: Negative; No fevers, chills, or night sweats;  HEENT: Negative; No   changes in vision or hearing, sinus congestion, difficulty swallowing Pulmonary: Negative; No cough, wheezing, shortness of breath, hemoptysis Cardiovascular:  No recurrent anginal symptoms.  No PND, orthopnea.  No palpitations GI: Negative; No nausea, vomiting, diarrhea, or abdominal pain GU: Negative; No dysuria, hematuria, or difficulty voiding Musculoskeletal: Negative; no myalgias, joint pain, or weakness Hematologic/Oncology: Negative; no easy bruising, bleeding Endocrine: Negative; no  heat/cold intolerance; no diabetes Neuro: Negative; no changes in balance, headaches Skin: Negative; No rashes or skin lesions Psychiatric: Negative; No behavioral problems, depression Sleep:Positive for OSA, now with a customized oral dental appliance; no daytime sleepiness, hypersomnolence, bruxism, restless legs, hypnogognic hallucinations, no cataplexy Other comprehensive 14 point system review is negative.   PE BP 124/74   Pulse 82   Ht 6' (1.829 m)   Wt 224 lb (101.6 kg)   BMI 30.38 kg/m    Repeat blood pressure by me was 124/78  Wt Readings from Last 3 Encounters:  09/07/18 224 lb (101.6 kg)  07/27/18 225 lb (102.1 kg)  12/18/17 222 lb 9.6 oz (101 kg)   General: Alert, oriented, no distress.  Skin: normal turgor, no rashes, warm and dry HEENT: Normocephalic, atraumatic. Pupils equal round and reactive to light; sclera anicteric; extraocular muscles intact;  Nose without nasal septal hypertrophy Mouth/Parynx benign; Mallinpatti scale 3 Neck: No JVD, no carotid bruits; normal carotid upstroke Lungs: clear to ausculatation and percussion; no wheezing or rales Chest wall: without tenderness to palpitation Heart: PMI not displaced, RRR, s1 s2 normal, 1/6 systolic murmur, no diastolic murmur, no rubs, gallops, thrills, or heaves Abdomen: soft, nontender; no hepatosplenomehaly, BS+; abdominal aorta nontender and not dilated by palpation. Back: no CVA tenderness Pulses 2+ Musculoskeletal: full range of motion, normal strength, no joint deformities Extremities: no clubbing cyanosis or edema, Homan's sign negative  Neurologic: grossly nonfocal; Cranial nerves grossly wnl Psychologic: Normal mood and affect   ECG (independently read by me): Normal sinus rhythm at 82 bpm with an isolated PVC.  No significant ST-T changes.  April 2019 ECG (independently read by me): normal sinus rhythm at 77 bpm.  No ectopy.  Normal intervals.  October 2018 ECG (independently read by me):  Normal sinus rhythm at 73 bpm.  No ectopy.  Normal intervals.  May 2018 ECG (independently read by me): Normal sinus rhythm at 76 bpm.  Normal intervals.  No ST segment changes.  February 2018 ECG (independently read by me): Normal sinus rhythm at 68 bpm with mild sinus arrhythmia.  Isolated PVC.  Normal intervals.  April 2017 ECG (independently read by me): Normal sinus rhythm at 93 bpm.  No ectopy.  Normal intervals.  No ECG evidence of prior MI.  April 2016 ECG (independently read by me): Normal sinus rhythm at 64.  No ectopy.  Normal intervals.  January 2015 ECG (independently interpreted by me): Normal sinus rhythm at 71 beats per minute. No significant ST changes. Normal intervals.  Last ECG of 03/26/2013: Normal sinus rhythm at 71 beats per minute. Normal intervals.  LABS:  BMP Latest Ref Rng & Units 10/14/2017 10/08/2016 10/03/2015  Glucose 70 - 99 mg/dL 85 99 93  BUN 6 - 23 mg/dL 15 17 13  Creatinine 0.40 - 1.50 mg/dL 0.86 0.87 0.79  Sodium 135 - 145 mEq/L 141 139 141  Potassium 3.5 - 5.1 mEq/L 4.0 4.0 4.4  Chloride 96 - 112 mEq/L 104 106 104  CO2 19 - 32 mEq/L 29 28 32  Calcium 8.4 - 10.5 mg/dL 9.1 9.0 9.4      Hepatic Function Latest Ref Rng & Units 10/14/2017 10/08/2016 04/12/2016  Total Protein 6.0 - 8.3 g/dL 7.9 7.8 7.8  Albumin 3.5 - 5.2 g/dL 4.2 4.2 4.2  AST 0 - 37 U/L 24 35 35  ALT 0 - 53 U/L 27 47 47  Alk Phosphatase 39 - 117 U/L 37(L) 33(L) 39  Total Bilirubin 0.2 - 1.2 mg/dL 0.6 0.5 0.5  Bilirubin, Direct 0.0 - 0.3 mg/dL 0.1 0.1 0.1    CBC Latest Ref Rng & Units 10/14/2017 12/30/2016 10/08/2016  WBC 4.0 - 10.5 K/uL 5.5 4.8 4.8  Hemoglobin 13.0 - 17.0 g/dL 14.8 13.7 14.6  Hematocrit 39.0 - 52.0 % 43.9 44 43.0  Platelets 150.0 - 400.0 K/uL 73.0 Repeated and verified X2.(L) 69(A) 72.0 Repeated and verified X2.(L)   Lab Results  Component Value Date   TSH 3.29 12/02/2017    BNP    Component Value Date/Time   PROBNP 99.0 04/28/2009 0420    Lipid Panel       Component Value Date/Time   CHOL 85 10/14/2017 0815   TRIG 108.0 10/14/2017 0815   TRIG 347 (HH) 07/17/2006 1010   HDL 24.10 (L) 10/14/2017 0815   CHOLHDL 4 10/14/2017 0815   VLDL 21.6 10/14/2017 0815   LDLCALC 40 10/14/2017 0815    RADIOLOGY: No results found.  IMPRESSION:  1. CAD in native artery   2. Old MI (myocardial infarction)   3. OSA (obstructive sleep apnea)   4. Hyperlipidemia with target LDL less than 70   5. Thrombocytopenia ([facility])    ASSESSMENT AND PLAN: Mr. Kissoon is a 67-year-old white male who is now 9-1/2 years following his acute coronary syndrome when he presented with left circumflex occlusion and underwent successful  early revascularization. He  has had complete salvage of myocardium verified by nuclear imaging.  Presently, he continues to do well and is not having any recurrent anginal symptoms and continues to experience excellent exercise program of exercises 5-6 days per week.  He travels extensively he has traveled to [place] as well as to [place] and [place] where he is hiked vigorously without recurrent symptomatology.  He retired in May 2018.  Presently he is without recurrent anginal symptomatology.  He has not required any nitroglycerin use.  He continues to be on baby aspirin.  He is on rosuvastatin 20 mg with target LDL less than 70.  In February 2019 LDL was excellent at 40.  He has been found to have mild to moderate obstructive sleep apnea and has been using an oral appliance followed by Dr. Mark Katz.  He is required tweaking of his appliance.  He is monitoring his snoring with an app at home.  He denies any residual daytime sleepiness.  I reviewed his emergency room evaluation when he presented with persistent bleeding from his right anterior nares to the 3 mm area which was felt to be mild venous bleeding.  He was treated with silver nitrate and a ultimately saw ENT for follow-up evaluation.  His ECG remained stable.  I commended him on his  exercise program.  If he continues to have issues with oral appliance we did discuss potential CPAP therapy if necessary in the future.  Will be going to [place] for several weeks.  Repeat laboratory will be obtained with his primary physician.  I will see him in 6 months for cardiology reevaluation or sooner as necessary.  Thomas A. Kelly, Carter, FACC  09/07/2018 6:06 PM    

## 2018-09-07 NOTE — Patient Instructions (Signed)

## 2018-10-20 NOTE — Progress Notes (Signed)
Chief Complaint  Patient presents with  . Annual Exam    Pt is healthy and well     HPI: Jon Carter 68 y.o. comes in today for Preventive Medicare exam/  And med check .Since last visit. 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   And to go to [place] for 2 months   Asks about  immunizations needed etc  as skin area rosacea  Under care   Orthodonticts.   moouth appliance  For  sleep apnea  Cad  No sx on  crestor  Sees  Cards dr Claiborne Billings   Health Maintenance  Topic Date Due  . INFLUENZA VACCINE  04/02/2018  . COLONOSCOPY  04/14/2024  . TETANUS/TDAP  01/21/2027  . Hepatitis C Screening  Completed  . PNA vac Low Risk Adult  Completed   Health Maintenance Review LIFESTYLE:  Exercise:   4 miles per day  Gym  Tobacco/ETS:  no Alcohol:   Patten 2 per night  Sugar beverages:  No to little Sleep:   About 8 hours  Drug use: no HH:  2 no pets  .    Hearing:  Ok   Vision:  No limitations at present .   [name]    Safety:  Has smoke detector and wears seat belts.  . No excess sun exposure. H Sees dentist regularly.  Falls: n.  Memory: Felt to be good  , no concern from her or her family.  Depression: No anhedonia unusual crying or depressive symptoms  Nutrition: Eats well balanced diet; adequate calcium and vitamin D. No swallowing chewing problems.  Injury: no major injuries in the last six months.  Other healthcare providers:  Reviewed today .  Preventive parameters: up-to-date  Reviewed   ADLS:   There are no problems or need for assistance  driving, feeding, obtaining food, dressing, toileting and bathing, managing money using phone.  is independent   ROS:  GEN/ HEENT: No fever, significant weight changes sweats headaches vision problems hearing changes, CV/ PULM; No chest pain shortness of breath cough, syncope,edema  change in exercise  tolerance. GI /GU: No adominal pain, vomiting, change in bowel habits. No blood in the stool. No significant GU symptoms. SKIN/HEME: ,no acute skin rashes suspicious lesions or bleeding. Under derm care  No lymphadenopathy, nodules, masses.  NEURO/ PSYCH:  No neurologic signs such as weakness numbness. No depression anxiety. IMM/ Allergy: No unusual infections.  Allergy .   REST of 12 system review negative except as per HPI   Past Medical History:  Diagnosis Date  . Adjustment disorder with anxiety 06/13/2009   Qualifier: Diagnosis of  By: Jon Carter, Jon Carter  related to his sudden MI underwent counseling takes low-dose medication as needed had side effects from an SSRI   . Alcohol addiction (Jon Carter)   . Hyperlipidemia    Hx of low HDL zero CAC in 2000 at [facility]  . Low testosterone   . STEMI (ST elevation myocardial infarction) (Jon Carter) 04/26/2009   left circumflex DES     Family History  Problem Relation Age of Onset  . Tongue cancer Father        tobacco  . Throat cancer Father   . Cirrhosis Father        also heart problems from etoh   . Hypertension  Mother   . Heart Problems Unknown        mom  in 43s     Social History   Socioeconomic History  . Marital status: Married    Spouse name: Not on file  . Number of children: Not on file  . Years of education: Not on file  . Highest education level: Not on file  Occupational History  . Occupation: phd Copywriter, advertising: Jon Carter  . Financial resource strain: Not on file  . Food insecurity:    Worry: Not on file    Inability: Not on file  . Transportation needs:    Medical: Not on file    Non-medical: Not on file  Tobacco Use  . Smoking status: Never Smoker  . Smokeless tobacco: Never Used  Substance and Sexual Activity  . Alcohol use: Yes    Alcohol/week: 2.0 standard drinks    Types: 2 Standard drinks or equivalent per week  . Drug use: No  . Sexual activity: Not on file  Lifestyle  .  Physical activity:    Days per week: Not on file    Minutes per session: Not on file  . Stress: Not on file  Relationships  . Social connections:    Talks on phone: Not on file    Gets together: Not on file    Attends religious service: Not on file    Active member of club or organization: Not on file    Attends meetings of clubs or organizations: Not on file    Relationship status: Not on file  Other Topics Concern  . Not on file  Social History Narrative   Designated Party Release signed on 02/05/2010     [phone]    married works at [employer] retired Therapist, art reserve PhD Surveyor, quantity   never smoked regular exercise household  3    no pets   2-3 beverages per day wine      Retired may 18             Outpatient Encounter Medications as of 10/21/2018  Medication Sig  . aspirin 81 MG tablet Take 81 mg by mouth daily.    . Coenzyme Q10 150 MG CAPS Take 150 mg by mouth daily.   . nitroGLYCERIN (NITROSTAT) 0.4 MG SL tablet Place 1 tablet (0.4 mg total) under the tongue every 5 (five) minutes as needed.  . rosuvastatin (CRESTOR) 20 MG tablet TAKE 1 TABLET BY MOUTH ONCE DAILY  . valACYclovir (VALTREX) 1000 MG tablet Take 2 tablets (2,000 mg total) by mouth 2 (two) times daily. As needed for cold sores.   No facility-administered encounter medications on file as of 10/21/2018.     EXAM:  BP 124/76 (BP Location: Right Arm, Patient Position: Sitting, Cuff Size: Normal)   Pulse 71   Temp 97.7 F (36.5 C) (Oral)   Ht 5\' 11"  (1.803 m)   Wt 227 lb 12.8 oz (103.3 kg)   BMI 31.77 kg/m   Body mass index is 31.77 kg/m.  Physical Exam: Vital signs reviewed NIO:EVOJ is a well-developed well-nourished alert cooperative   who appears stated age in no acute distress.  HEENT: normocephalic atraumatic , Eyes: PERRL EOM's full, conjunctiva clear, Nares: paten,t no deformity discharge or tenderness., Ears: no deformity EAC's clear TMs with normal landmarks. Mouth: clear OP, no lesions,  edema.  Moist mucous membranes. Dentition in adequate repair. NECK: supple without masses, thyromegaly or bruits.  CHEST/PULM:  Clear to auscultation and percussion breath sounds equal no wheeze , rales or rhonchi. No chest wall deformities or tenderness. CV: PMI is nondisplaced, S1 S2 no gallops, murmurs, rubs. Peripheral pulses are full without delay.No JVD.  ABDOMEN: Bowel sounds normal nontender  No guard or rebound, no hepato splenomegal no CVA tenderness.   Extremtities:  No clubbing cyanosis or edema, no acute joint swelling or redness no focal atrophy NEURO:  Oriented x3, cranial nerves 3-12 appear to be intact, no obvious focal weakness,gait within normal limits no abnormal reflexes or asymmetrical SKIN: No acute rashes normal turgor, color, no bruising or petechiae.  Rosacea  Few scaly areas on face  PSYCH: Oriented, good eye contact, no obvious depression anxiety, cognition and judgment appear normal. LN: no cervical axillary inguinal adenopathy No noted deficits in memory, attention, and speech.   Lab Results  Component Value Date   WBC 9.4 10/21/2018   HGB 15.2 10/21/2018   HCT 46.0 10/21/2018   PLT 59.0 (L) 10/21/2018   GLUCOSE 87 10/21/2018   CHOL 80 10/21/2018   TRIG 176.0 (H) 10/21/2018   HDL 23.30 (L) 10/21/2018   LDLDIRECT 128.7 01/04/2009   LDLCALC 22 10/21/2018   ALT 36 10/21/2018   AST 30 10/21/2018   NA 138 10/21/2018   K 4.2 10/21/2018   CL 102 10/21/2018   CREATININE 0.93 10/21/2018   BUN 16 10/21/2018   CO2 28 10/21/2018   TSH 4.30 10/21/2018   PSA 0.38 10/21/2018   INR 1.1 05/01/2009    ASSESSMENT AND PLAN:  Discussed the following assessment and plan:  Visit for preventive health examination  Hyperlipidemia, unspecified hyperlipidemia type - Plan: Basic metabolic panel, CBC with Differential/Platelet, Hepatic function panel, Lipid panel, TSH  Medication management - Plan: Basic metabolic panel, CBC with Differential/Platelet, Hepatic function  panel, Lipid panel, TSH  Thrombocytopenia (Kermit) - Plan: Basic metabolic panel, CBC with Differential/Platelet, Hepatic function panel, Lipid panel, TSH  Elevated LFTs - Plan: Basic metabolic panel, CBC with Differential/Platelet, Hepatic function panel, Lipid panel, TSH  Rosacea - Plan: Basic metabolic panel, CBC with Differential/Platelet, Hepatic function panel, Lipid panel, TSH  Atherosclerosis of native coronary artery of native heart without angina pectoris - Plan: Basic metabolic panel, CBC with Differential/Platelet, Hepatic function panel, Lipid panel, TSH  Screening PSA (prostate specific antigen) - Plan: PSA  History of cold sores - valtrex rx given  Look into immuniz   Hep a if not done in past   He will check cdc web site and send in message about what is needed  Can come back for hep a if needed Monitoring lab  Exam is reassuring Patient Care Team: Burnis Medin, Carter as PCP - General Troy Sine, Carter as PCP - Cardiology (Cardiology) Troy Sine, Carter (Cardiology) Calvert Cantor, Carter as Consulting Physician (Ophthalmology) Jari Pigg, Carter as Consulting Physician (Dermatology) Richmond Campbell, Carter as Consulting Physician (Gastroenterology)  Patient Instructions   Glad you are doing well.   Valtrex  1-2 days as needed.   Consider hepatitis A vaccine  Send [name] a  Message about vaccines needed   Immunization History  Administered Date(s) Administered  . Influenza Split 05/14/2011  . Influenza Whole 06/23/2007, 05/16/2010  . Influenza, High Dose Seasonal PF 06/11/2017  . Influenza,inj,Quad PF,6+ Mos 05/11/2013, 06/11/2016  . Influenza-Unspecified 06/11/2017  . Pneumococcal Conjugate-13 06/11/2016  . Pneumococcal Polysaccharide-23 12/13/2009, 06/19/2017  . Td 04/19/2008  . Tdap 01/20/2017  . Zoster 05/23/2011  . Zoster Recombinat (Shingrix)  04/11/2017, 07/31/2017    Health Maintenance, Male A healthy lifestyle and preventive care is important for your health  and wellness. Ask your health care provider about what schedule of regular examinations is right for you. What should I know about weight and diet? Eat a Healthy Diet  Eat plenty of vegetables, fruits, whole grains, low-fat dairy products, and lean protein.  Do not eat a lot of foods high in solid fats, added sugars, or salt.  Maintain a Healthy Weight Regular exercise can help you achieve or maintain a healthy weight. You should:  Do at least 150 minutes of exercise each week. The exercise should increase your heart rate and make you sweat (moderate-intensity exercise).  Do strength-training exercises at least twice a week. Watch Your Levels of Cholesterol and Blood Lipids  Have your blood tested for lipids and cholesterol every 5 years starting at 68 years of age. If you are at high risk for heart disease, you should start having your blood tested when you are 68 years old. You may need to have your cholesterol levels checked more often if: ? Your lipid or cholesterol levels are high. ? You are older than 68 years of age. ? You are at high risk for heart disease. What should I know about cancer screening? Many types of cancers can be detected early and may often be prevented. Lung Cancer  You should be screened every year for lung cancer if: ? You are a current smoker who has smoked for at least 30 years. ? You are a former smoker who has quit within the past 15 years.  Talk to your health care provider about your screening options, when you should start screening, and how often you should be screened. Colorectal Cancer  Routine colorectal cancer screening usually begins at 68 years of age and should be repeated every 5-10 years until you are 68 years old. You may need to be screened more often if early forms of precancerous polyps or small growths are found. Your health care provider may recommend screening at an earlier age if you have risk factors for colon cancer.  Your health  care provider may recommend using home test kits to check for hidden blood in the stool.  A small camera at the end of a tube can be used to examine your colon (sigmoidoscopy or colonoscopy). This checks for the earliest forms of colorectal cancer. Prostate and Testicular Cancer  Depending on your age and overall health, your health care provider may do certain tests to screen for prostate and testicular cancer.  Talk to your health care provider about any symptoms or concerns you have about testicular or prostate cancer. Skin Cancer  Check your skin from head to toe regularly.  Tell your health care provider about any new moles or changes in moles, especially if: ? There is a change in a mole's size, shape, or color. ? You have a mole that is larger than a pencil eraser.  Always use sunscreen. Apply sunscreen liberally and repeat throughout the day.  Protect yourself by wearing long sleeves, pants, a wide-brimmed hat, and sunglasses when outside. What should I know about heart disease, diabetes, and high blood pressure?  If you are 46-85 years of age, have your blood pressure checked every 3-5 years. If you are 49 years of age or older, have your blood pressure checked every year. You should have your blood pressure measured twice-once when you are at a hospital or clinic, and once when  you are not at a hospital or clinic. Record the average of the two measurements. To check your blood pressure when you are not at a hospital or clinic, you can use: ? An automated blood pressure machine at a pharmacy. ? A home blood pressure monitor.  Talk to your health care provider about your target blood pressure.  If you are between 23-44 years old, ask your health care provider if you should take aspirin to prevent heart disease.  Have regular diabetes screenings by checking your fasting blood sugar level. ? If you are at a normal weight and have a low risk for diabetes, have this test once every  three years after the age of 62. ? If you are overweight and have a high risk for diabetes, consider being tested at a younger age or more often.  A one-time screening for abdominal aortic aneurysm (AAA) by ultrasound is recommended for men aged 30-75 years who are current or former smokers. What should I know about preventing infection? Hepatitis B If you have a higher risk for hepatitis B, you should be screened for this virus. Talk with your health care provider to find out if you are at risk for hepatitis B infection. Hepatitis C Blood testing is recommended for:  Everyone born from [birth year] through 1965.  Anyone with known risk factors for hepatitis C. Sexually Transmitted Diseases (STDs)  You should be screened each year for STDs including gonorrhea and chlamydia if: ? You are sexually active and are younger than 68 years of age. ? You are older than 68 years of age and your health care provider tells you that you are at risk for this type of infection. ? Your sexual activity has changed since you were last screened and you are at an increased risk for chlamydia or gonorrhea. Ask your health care provider if you are at risk.  Talk with your health care provider about whether you are at high risk of being infected with HIV. Your health care provider may recommend a prescription medicine to help prevent HIV infection. What else can I do?  Schedule regular health, dental, and eye exams.  Stay current with your vaccines (immunizations).  Do not use any tobacco products, such as cigarettes, chewing tobacco, and e-cigarettes. If you need help quitting, ask your health care provider.  Limit alcohol intake to no more than 2 drinks per day. One drink equals 12 ounces of beer, 5 ounces of wine, or 1 ounces of hard liquor.  Do not use street drugs.  Do not share needles.  Ask your health care provider for help if you need support or information about quitting drugs.  Tell your health  care provider if you often feel depressed.  Tell your health care provider if you have ever been abused or do not feel safe at home. This information is not intended to replace advice given to you by your health care provider. Make sure you discuss any questions you have with your health care provider. Document Released: 02/15/2008 Document Revised: 04/17/2016 Document Reviewed: 05/23/2015 Elsevier Interactive Patient Education  2019 [place] K. Mionna Advincula M.D.

## 2018-10-21 ENCOUNTER — Encounter: Payer: Self-pay | Admitting: Internal Medicine

## 2018-10-21 ENCOUNTER — Ambulatory Visit (INDEPENDENT_AMBULATORY_CARE_PROVIDER_SITE_OTHER): Payer: Medicare Other | Admitting: Internal Medicine

## 2018-10-21 ENCOUNTER — Other Ambulatory Visit: Payer: Medicare Other

## 2018-10-21 VITALS — BP 124/76 | HR 71 | Temp 97.7°F | Ht 71.0 in | Wt 227.8 lb

## 2018-10-21 DIAGNOSIS — R7989 Other specified abnormal findings of blood chemistry: Secondary | ICD-10-CM

## 2018-10-21 DIAGNOSIS — Z79899 Other long term (current) drug therapy: Secondary | ICD-10-CM | POA: Diagnosis not present

## 2018-10-21 DIAGNOSIS — Z0000 Encounter for general adult medical examination without abnormal findings: Secondary | ICD-10-CM | POA: Diagnosis not present

## 2018-10-21 DIAGNOSIS — E785 Hyperlipidemia, unspecified: Secondary | ICD-10-CM

## 2018-10-21 DIAGNOSIS — Z8619 Personal history of other infectious and parasitic diseases: Secondary | ICD-10-CM

## 2018-10-21 DIAGNOSIS — R945 Abnormal results of liver function studies: Secondary | ICD-10-CM

## 2018-10-21 DIAGNOSIS — L719 Rosacea, unspecified: Secondary | ICD-10-CM

## 2018-10-21 DIAGNOSIS — Z125 Encounter for screening for malignant neoplasm of prostate: Secondary | ICD-10-CM

## 2018-10-21 DIAGNOSIS — D696 Thrombocytopenia, unspecified: Secondary | ICD-10-CM

## 2018-10-21 DIAGNOSIS — I2510 Atherosclerotic heart disease of native coronary artery without angina pectoris: Secondary | ICD-10-CM

## 2018-10-21 LAB — CBC WITH DIFFERENTIAL/PLATELET
BASOS PCT: 0.5 % (ref 0.0–3.0)
Basophils Absolute: 0 10*3/uL (ref 0.0–0.1)
EOS ABS: 0 10*3/uL (ref 0.0–0.7)
EOS PCT: 0.1 % (ref 0.0–5.0)
HCT: 46 % (ref 39.0–52.0)
Hemoglobin: 15.2 g/dL (ref 13.0–17.0)
LYMPHS ABS: 2 10*3/uL (ref 0.7–4.0)
Lymphocytes Relative: 21.5 % (ref 12.0–46.0)
MCHC: 33.1 g/dL (ref 30.0–36.0)
MCV: 88.7 fl (ref 78.0–100.0)
MONO ABS: 4.4 10*3/uL — AB (ref 0.1–1.0)
Monocytes Relative: 47.2 % — ABNORMAL HIGH (ref 3.0–12.0)
NEUTROS PCT: 30.7 % — AB (ref 43.0–77.0)
Neutro Abs: 2.9 10*3/uL (ref 1.4–7.7)
Platelets: 59 10*3/uL — ABNORMAL LOW (ref 150.0–400.0)
RBC: 5.19 Mil/uL (ref 4.22–5.81)
RDW: 15.3 % (ref 11.5–15.5)
WBC: 9.4 10*3/uL (ref 4.0–10.5)

## 2018-10-21 LAB — BASIC METABOLIC PANEL
BUN: 16 mg/dL (ref 6–23)
CO2: 28 mEq/L (ref 19–32)
CREATININE: 0.93 mg/dL (ref 0.40–1.50)
Calcium: 9.3 mg/dL (ref 8.4–10.5)
Chloride: 102 mEq/L (ref 96–112)
GFR: 80.81 mL/min (ref >60.00)
GLUCOSE: 87 mg/dL (ref 70–99)
POTASSIUM: 4.2 meq/L (ref 3.5–5.1)
Sodium: 138 mEq/L (ref 135–145)

## 2018-10-21 LAB — LIPID PANEL
CHOLESTEROL: 80 mg/dL (ref 0–200)
HDL: 23.3 mg/dL — ABNORMAL LOW (ref >39.00)
LDL CALC: 22 mg/dL (ref 0–99)
NonHDL: 56.97
TRIGLYCERIDES: 176 mg/dL — AB (ref 0.0–149.0)
Total CHOL/HDL Ratio: 3
VLDL: 35.2 mg/dL (ref 0.0–40.0)

## 2018-10-21 LAB — TSH: TSH: 4.3 u[IU]/mL (ref 0.35–4.50)

## 2018-10-21 LAB — HEPATIC FUNCTION PANEL
ALT: 36 U/L (ref 0–53)
AST: 30 U/L (ref 0–37)
Albumin: 4.4 g/dL (ref 3.5–5.2)
Alkaline Phosphatase: 38 U/L — ABNORMAL LOW (ref 39–117)
BILIRUBIN TOTAL: 0.8 mg/dL (ref 0.2–1.2)
Bilirubin, Direct: 0.2 mg/dL (ref 0.0–0.3)
Total Protein: 8.4 g/dL — ABNORMAL HIGH (ref 6.0–8.3)

## 2018-10-21 LAB — PSA: PSA: 0.38 ng/mL (ref 0.10–4.00)

## 2018-10-21 MED ORDER — VALACYCLOVIR HCL 1 G PO TABS: 2000.0000 mg | ORAL_TABLET | Freq: Two times a day (BID) | ORAL | 2 refills | Status: DC

## 2018-10-21 NOTE — Patient Instructions (Signed)
Glad you are doing well.   Valtrex  1-2 days as needed.   Consider hepatitis A vaccine  Send [name] a  Message about vaccines needed   Immunization History  Administered Date(s) Administered  . Influenza Split 05/14/2011  . Influenza Whole 06/23/2007, 05/16/2010  . Influenza, High Dose Seasonal PF 06/11/2017  . Influenza,inj,Quad PF,6+ Mos 05/11/2013, 06/11/2016  . Influenza-Unspecified 06/11/2017  . Pneumococcal Conjugate-13 06/11/2016  . Pneumococcal Polysaccharide-23 12/13/2009, 06/19/2017  . Td 04/19/2008  . Tdap 01/20/2017  . Zoster 05/23/2011  . Zoster Recombinat (Shingrix) 04/11/2017, 07/31/2017    Health Maintenance, Male A healthy lifestyle and preventive care is important for your health and wellness. Ask your health care provider about what schedule of regular examinations is right for you. What should I know about weight and diet? Eat a Healthy Diet  Eat plenty of vegetables, fruits, whole grains, low-fat dairy products, and lean protein.  Do not eat a lot of foods high in solid fats, added sugars, or salt.  Maintain a Healthy Weight Regular exercise can help you achieve or maintain a healthy weight. You should:  Do at least 150 minutes of exercise each week. The exercise should increase your heart rate and make you sweat (moderate-intensity exercise).  Do strength-training exercises at least twice a week. Watch Your Levels of Cholesterol and Blood Lipids  Have your blood tested for lipids and cholesterol every 5 years starting at 68 years of age. If you are at high risk for heart disease, you should start having your blood tested when you are 68 years old. You may need to have your cholesterol levels checked more often if: ? Your lipid or cholesterol levels are high. ? You are older than 68 years of age. ? You are at high risk for heart disease. What should I know about cancer screening? Many types of cancers can be detected early and may often be prevented. Lung  Cancer  You should be screened every year for lung cancer if: ? You are a current smoker who has smoked for at least 30 years. ? You are a former smoker who has quit within the past 15 years.  Talk to your health care provider about your screening options, when you should start screening, and how often you should be screened. Colorectal Cancer  Routine colorectal cancer screening usually begins at 68 years of age and should be repeated every 5-10 years until you are 68 years old. You may need to be screened more often if early forms of precancerous polyps or small growths are found. Your health care provider may recommend screening at an earlier age if you have risk factors for colon cancer.  Your health care provider may recommend using home test kits to check for hidden blood in the stool.  A small camera at the end of a tube can be used to examine your colon (sigmoidoscopy or colonoscopy). This checks for the earliest forms of colorectal cancer. Prostate and Testicular Cancer  Depending on your age and overall health, your health care provider may do certain tests to screen for prostate and testicular cancer.  Talk to your health care provider about any symptoms or concerns you have about testicular or prostate cancer. Skin Cancer  Check your skin from head to toe regularly.  Tell your health care provider about any new moles or changes in moles, especially if: ? There is a change in a mole's size, shape, or color. ? You have a mole that is larger than  a pencil eraser.  Always use sunscreen. Apply sunscreen liberally and repeat throughout the day.  Protect yourself by wearing long sleeves, pants, a wide-brimmed hat, and sunglasses when outside. What should I know about heart disease, diabetes, and high blood pressure?  If you are 41-39 years of age, have your blood pressure checked every 3-5 years. If you are 28 years of age or older, have your blood pressure checked every year.  You should have your blood pressure measured twice-once when you are at a hospital or clinic, and once when you are not at a hospital or clinic. Record the average of the two measurements. To check your blood pressure when you are not at a hospital or clinic, you can use: ? An automated blood pressure machine at a pharmacy. ? A home blood pressure monitor.  Talk to your health care provider about your target blood pressure.  If you are between 58-41 years old, ask your health care provider if you should take aspirin to prevent heart disease.  Have regular diabetes screenings by checking your fasting blood sugar level. ? If you are at a normal weight and have a low risk for diabetes, have this test once every three years after the age of 5. ? If you are overweight and have a high risk for diabetes, consider being tested at a younger age or more often.  A one-time screening for abdominal aortic aneurysm (AAA) by ultrasound is recommended for men aged 64-75 years who are current or former smokers. What should I know about preventing infection? Hepatitis B If you have a higher risk for hepatitis B, you should be screened for this virus. Talk with your health care provider to find out if you are at risk for hepatitis B infection. Hepatitis C Blood testing is recommended for:  Everyone born from [birth year] through 1965.  Anyone with known risk factors for hepatitis C. Sexually Transmitted Diseases (STDs)  You should be screened each year for STDs including gonorrhea and chlamydia if: ? You are sexually active and are younger than 68 years of age. ? You are older than 68 years of age and your health care provider tells you that you are at risk for this type of infection. ? Your sexual activity has changed since you were last screened and you are at an increased risk for chlamydia or gonorrhea. Ask your health care provider if you are at risk.  Talk with your health care provider about whether you  are at high risk of being infected with HIV. Your health care provider may recommend a prescription medicine to help prevent HIV infection. What else can I do?  Schedule regular health, dental, and eye exams.  Stay current with your vaccines (immunizations).  Do not use any tobacco products, such as cigarettes, chewing tobacco, and e-cigarettes. If you need help quitting, ask your health care provider.  Limit alcohol intake to no more than 2 drinks per day. One drink equals 12 ounces of beer, 5 ounces of wine, or 1 ounces of hard liquor.  Do not use street drugs.  Do not share needles.  Ask your health care provider for help if you need support or information about quitting drugs.  Tell your health care provider if you often feel depressed.  Tell your health care provider if you have ever been abused or do not feel safe at home. This information is not intended to replace advice given to you by your health care provider. Make sure you discuss  any questions you have with your health care provider. Document Released: 02/15/2008 Document Revised: 04/17/2016 Document Reviewed: 05/23/2015 Elsevier Interactive Patient Education  2019 [employer].

## 2018-10-22 LAB — CBC WITH DIFFERENTIAL/PLATELET
ABSOLUTE MONOCYTES: 3385 {cells}/uL — AB (ref 200–950)
Basophils Absolute: 46 cells/uL (ref 0–200)
Basophils Relative: 0.5 %
Eosinophils Absolute: 9 cells/uL — ABNORMAL LOW (ref 15–500)
Eosinophils Relative: 0.1 %
HCT: 48.2 % (ref 38.5–50.0)
Hemoglobin: 15.6 g/dL (ref 13.2–17.1)
Lymphs Abs: 2675 cells/uL (ref 850–3900)
MCH: 29.6 pg (ref 27.0–33.0)
MCHC: 32.4 g/dL (ref 32.0–36.0)
MCV: 91.5 fL (ref 80.0–100.0)
MONOS PCT: 37.2 %
NEUTROS PCT: 32.8 %
Neutro Abs: 2985 cells/uL (ref 1500–7800)
PLATELETS: 56 10*3/uL — AB (ref 140–400)
RBC: 5.27 10*6/uL (ref 4.20–5.80)
RDW: 14.4 % (ref 11.0–15.0)
TOTAL LYMPHOCYTE: 29.4 %
WBC: 9.1 10*3/uL (ref 3.8–10.8)

## 2018-10-22 LAB — PATHOLOGIST SMEAR REVIEW

## 2018-10-26 ENCOUNTER — Other Ambulatory Visit: Payer: Self-pay

## 2018-10-26 DIAGNOSIS — D691 Qualitative platelet defects: Secondary | ICD-10-CM

## 2018-10-29 ENCOUNTER — Ambulatory Visit (INDEPENDENT_AMBULATORY_CARE_PROVIDER_SITE_OTHER): Payer: Medicare Other | Admitting: Internal Medicine

## 2018-10-29 ENCOUNTER — Encounter: Payer: Self-pay | Admitting: Internal Medicine

## 2018-10-29 VITALS — BP 140/80 | HR 86 | Temp 97.9°F | Wt 230.1 lb

## 2018-10-29 DIAGNOSIS — M109 Gout, unspecified: Secondary | ICD-10-CM

## 2018-10-29 MED ORDER — COLCHICINE 0.6 MG PO CAPS: 1.0000 | ORAL_CAPSULE | Freq: Two times a day (BID) | ORAL | 0 refills | Status: AC

## 2018-10-29 MED ORDER — INDOMETHACIN 25 MG PO CAPS: 25.0000 mg | ORAL_CAPSULE | Freq: Three times a day (TID) | ORAL | 0 refills | Status: DC

## 2018-10-29 NOTE — Patient Instructions (Addendum)
-Hope you feel better soon!  -Use colchicine 2 times a day for the next 3 days and then as needed for pain. Come back to see [name] if not better within 5-8 days.   Gout  Gout is painful swelling of your joints. Gout is a type of arthritis. It is caused by having too much uric acid in your body. Uric acid is a chemical that is made when your body breaks down substances called purines. If your body has too much uric acid, sharp crystals can form and build up in your joints. This causes pain and swelling. Gout attacks can happen quickly and be very painful (acute gout). Over time, the attacks can affect more joints and happen more often (chronic gout). What are the causes?  Too much uric acid in your blood. This can happen because: ? Your kidneys do not remove enough uric acid from your blood. ? Your body makes too much uric acid. ? You eat too many foods that are high in purines. These foods include organ meats, some seafood, and beer.  Trauma or stress. What increases the risk?  Having a family history of gout.  Being male and middle-aged.  Being male and having gone through menopause.  Being very overweight (obese).  Drinking alcohol, especially beer.  Not having enough water in the body (being dehydrated).  Losing weight too quickly.  Having an organ transplant.  Having lead poisoning.  Taking certain medicines.  Having kidney disease.  Having a skin condition called psoriasis. What are the signs or symptoms? An attack of acute gout usually happens in just one joint. The most common place is the big toe. Attacks often start at night. Other joints that may be affected include joints of the feet, ankle, knee, fingers, wrist, or elbow. Symptoms of an attack may include:  Very bad pain.  Warmth.  Swelling.  Stiffness.  Shiny, red, or purple skin.  Tenderness. The affected joint may be very painful to touch.  Chills and fever. Chronic gout may cause symptoms more  often. More joints may be involved. You may also have white or yellow lumps (tophi) on your hands or feet or in other areas near your joints. How is this treated?  Treatment for this condition has two phases: treating an acute attack and preventing future attacks.  Acute gout treatment may include: ? NSAIDs. ? Steroids. These are taken by mouth or injected into a joint. ? Colchicine. This medicine relieves pain and swelling. It can be given by mouth or through an IV tube.  Preventive treatment may include: ? Taking small doses of NSAIDs or colchicine daily. ? Using a medicine that reduces uric acid levels in your blood. ? Making changes to your diet. You may need to see a food expert (dietitian) about what to eat and drink to prevent gout. Follow these instructions at home: During a gout attack   If told, put ice on the painful area: ? Put ice in a plastic bag. ? Place a towel between your skin and the bag. ? Leave the ice on for 20 minutes, 2-3 times a day.  Raise (elevate) the painful joint above the level of your heart as often as you can.  Rest the joint as much as possible. If the joint is in your leg, you may be given crutches.  Follow instructions from your doctor about what you cannot eat or drink. Avoiding future gout attacks  Eat a low-purine diet. Avoid foods and drinks such as: ?  Liver. ? Kidney. ? Anchovies. ? Asparagus. ? Herring. ? Mushrooms. ? Mussels. ? Beer.  Stay at a healthy weight. If you want to lose weight, talk with your doctor. Do not lose weight too fast.  Start or continue an exercise plan as told by your doctor. Eating and drinking  Drink enough fluids to keep your pee (urine) pale yellow.  If you drink alcohol: ? Limit how much you use to:  0-1 drink a day for women.  0-2 drinks a day for men. ? Be aware of how much alcohol is in your drink. In the [place] one drink equals one 12 oz bottle of beer (355 mL), one 5 oz glass of wine (148  mL), or one 1 oz glass of hard liquor (44 mL). General instructions  Take over-the-counter and prescription medicines only as told by your doctor.  Do not drive or use heavy machinery while taking prescription pain medicine.  Return to your normal activities as told by your doctor. Ask your doctor what activities are safe for you.  Keep all follow-up visits as told by your doctor. This is important. Contact a doctor if:  You have another gout attack.  You still have symptoms of a gout attack after 10 days of treatment.  You have problems (side effects) because of your medicines.  You have chills or a fever.  You have burning pain when you pee (urinate).  You have pain in your lower back or belly. Get help right away if:  You have very bad pain.  Your pain cannot be controlled.  You cannot pee. Summary  Gout is painful swelling of the joints.  The most common site of pain is the big toe, but it can affect other joints.  Medicines and avoiding some foods can help to prevent and treat gout attacks. This information is not intended to replace advice given to you by your health care provider. Make sure you discuss any questions you have with your health care provider. Document Released: 05/28/2008 Document Revised: 03/11/2018 Document Reviewed: 03/11/2018 Elsevier Interactive Patient Education  2019 [place] A low-purine eating plan involves making food choices to limit your intake of purine. Purine is a kind of uric acid. Too much uric acid in your blood can cause certain conditions, such as gout and kidney stones. Eating a low-purine diet can help control these conditions. What are tips for following this plan? Reading food labels   Avoid foods with saturated or Trans fat.  Check the ingredient list of grains-based foods, such as bread and cereal, to make sure that they contain whole grains.  Check the ingredient list of sauces or  soups to make sure they do not contain meat or fish.  When choosing soft drinks, check the ingredient list to make sure they do not contain high-fructose corn syrup. Shopping  Buy plenty of fresh fruits and vegetables.  Avoid buying canned or fresh fish.  Buy dairy products labeled as low-fat or nonfat.  Avoid buying premade or processed foods. These foods are often high in fat, salt (sodium), and added sugar. Cooking  Use olive oil instead of butter when cooking. Oils like olive oil, canola oil, and sunflower oil contain healthy fats. Meal planning  Learn which foods do or do not affect you. If you find out that a food tends to cause your gout symptoms to flare up, avoid eating that food. You can enjoy foods that do not cause problems. If  you have any questions about a food item, talk with your dietitian or health care provider.  Limit foods high in fat, especially saturated fat. Fat makes it harder for your body to get rid of uric acid.  Choose foods that are lower in fat and are lean sources of protein. General guidelines  Limit alcohol intake to no more than 1 drink a day for nonpregnant women and 2 drinks a day for men. One drink equals 12 oz of beer, 5 oz of wine, or 1 oz of hard liquor. Alcohol can affect the way your body gets rid of uric acid.  Drink plenty of water to keep your urine clear or pale yellow. Fluids can help remove uric acid from your body.  If directed by your health care provider, take a vitamin C supplement.  Work with your health care provider and dietitian to develop a plan to achieve or maintain a healthy weight. Losing weight can help reduce uric acid in your blood. What foods are recommended? The items listed may not be a complete list. Talk with your dietitian about what dietary choices are best for you. Foods low in purines Foods low in purines do not need to be limited. These include:  All fruits.  All low-purine vegetables, pickles, and  olives.  Breads, pasta, rice, cornbread, and popcorn. Cake and other baked goods.  All dairy foods.  Eggs, nuts, and nut butters.  Spices and condiments, such as salt, herbs, and vinegar.  Plant oils, butter, and margarine.  Water, sugar-free soft drinks, tea, coffee, and cocoa.  Vegetable-based soups, broths, sauces, and gravies. Foods moderate in purines Foods moderate in purines should be limited to the amounts listed.   cup of asparagus, cauliflower, spinach, mushrooms, or green peas, each day.  2/3 cup uncooked oatmeal, each day.   cup dry wheat bran or wheat germ, each day.  2-3 ounces of meat or poultry, each day.  4-6 ounces of shellfish, such as crab, lobster, oysters, or shrimp, each day.  1 cup cooked beans, peas, or lentils, each day.  Soup, broths, or bouillon made from meat or fish. Limit these foods as much as possible. What foods are not recommended? The items listed may not be a complete list. Talk with your dietitian about what dietary choices are best for you. Limit your intake of foods high in purines, including:  Beer and other alcohol.  Meat-based gravy or sauce.  Canned or fresh fish, such as: ? Anchovies, sardines, herring, and tuna. ? Mussels and scallops. ? Codfish, trout, and haddock.  Berniece Salines.  Organ meats, such as: ? Liver or kidney. ? Tripe. ? Sweetbreads (thymus gland or pancreas).  [employer].  Yeast or yeast extract supplements.  Drinks sweetened with high-fructose corn syrup. Summary  Eating a low-purine diet can help control conditions caused by too much uric acid in the body, such as gout or kidney stones.  Choose low-purine foods, limit alcohol, and limit foods high in fat.  You will learn over time which foods do or do not affect you. If you find out that a food tends to cause your gout symptoms to flare up, avoid eating that food. This information is not intended to replace advice given to you by your health  care provider. Make sure you discuss any questions you have with your health care provider. Document Released: 12/14/2010 Document Revised: 10/02/2016 Document Reviewed: 10/02/2016 Elsevier Interactive Patient Education  2019 [employer].

## 2018-10-29 NOTE — Progress Notes (Signed)
Established Patient Office Visit     CC/Reason for Visit: left ankle pain and swelling  HPI: Jon Carter is a 68 y.o. male who is coming in today for the above mentioned reasons. For 2 days has been having pain and swelling of his left ankle. Does not recall a specific injury altho he does exercise quite a bit. Has been able to bear weight without issue. No insect bites that he is aware of. No fever/chills.   Past Medical/Surgical History: Past Medical History:  Diagnosis Date  . Adjustment disorder with anxiety 06/13/2009   Qualifier: Diagnosis of  By: Regis Bill MD, Standley Brooking  related to his sudden MI underwent counseling takes low-dose medication as needed had side effects from an SSRI   . Alcohol addiction ([place])   . Hyperlipidemia    Hx of low HDL zero CAC in 2000 at [place]  . Low testosterone   . STEMI (ST elevation myocardial infarction) ([place]) 04/26/2009   left circumflex DES     Past Surgical History:  Procedure Laterality Date  . CARDIAC CATHETERIZATION  05/02/2009   UA - patent Cfx stent, 50-60% LAD, 50-60% RCA with normal LV funtion (Dr. Adora Fridge)  . CATARACT EXTRACTION Left 2009   [facility]  . CORONARY ANGIOPLASTY WITH STENT PLACEMENT  04/26/2009   ACS/STEMI - total occlusion of mid Cfx - Xience DES 2.75x26mm (Dr. Corky Downs)  . NM MYOCAR PERF WALL MOTION  04/2012   bruce myoview; mild perfusion defect in basal inferior region (infarct/scar or overlying attenuation); post-stress EF 53%, EKG negative for ischemia, hypertensive response to exercise; abnormal study but unchanged from previous study  . TRANSTHORACIC ECHOCARDIOGRAM  05/2009   EF=50-55%; trace MR; mild TR, normal RSVP; AV mildly sclerotic    Social History:  reports that he has never smoked. He has never used smokeless tobacco. He reports current alcohol use of about 2.0 standard drinks of alcohol per week. He reports that he does not use drugs.  Allergies: No Known Allergies  Family History:  Family  History  Problem Relation Age of Onset  . Tongue cancer Father        tobacco  . Throat cancer Father   . Cirrhosis Father        also heart problems from etoh   . Hypertension Mother   . Heart Problems Unknown        mom  in 25s      Current Outpatient Medications:  .  aspirin 81 MG tablet, Take 81 mg by mouth daily.  , Disp: , Rfl:  .  Coenzyme Q10 150 MG CAPS, Take 150 mg by mouth daily. , Disp: , Rfl:  .  nitroGLYCERIN (NITROSTAT) 0.4 MG SL tablet, Place 1 tablet (0.4 mg total) under the tongue every 5 (five) minutes as needed., Disp: 5 tablet, Rfl: 6 .  rosuvastatin (CRESTOR) 20 MG tablet, TAKE 1 TABLET BY MOUTH ONCE DAILY, Disp: 90 tablet, Rfl: 0 .  valACYclovir (VALTREX) 1000 MG tablet, Take 2 tablets (2,000 mg total) by mouth 2 (two) times daily. As needed for cold sores., Disp: 30 tablet, Rfl: 2 .  Colchicine 0.6 MG CAPS, Take 1 capsule by mouth 2 (two) times daily., Disp: 45 capsule, Rfl: 0  Review of Systems:  Constitutional: Denies fever, chills, diaphoresis, appetite change and fatigue.  HEENT: Denies photophobia, eye pain, redness, hearing loss, ear pain, congestion, sore throat, rhinorrhea, sneezing, mouth sores, trouble swallowing, neck pain, neck stiffness and tinnitus.  Respiratory: Denies SOB, DOE, cough, chest tightness,  and wheezing.   Cardiovascular: Denies chest pain, palpitations and leg swelling.  Gastrointestinal: Denies nausea, vomiting, abdominal pain, diarrhea, constipation, blood in stool and abdominal distention.  Genitourinary: Denies dysuria, urgency, frequency, hematuria, flank pain and difficulty urinating.  Endocrine: Denies: hot or cold intolerance, sweats, changes in hair or nails, polyuria, polydipsia. Musculoskeletal: Denies myalgias, back pain, joint swelling, arthralgias and gait problem.  Skin: Denies pallor, rash and wound.  Neurological: Denies dizziness, seizures, syncope, weakness, light-headedness, numbness and headaches.    Hematological: Denies adenopathy. Easy bruising, personal or family bleeding history  Psychiatric/Behavioral: Denies suicidal ideation, mood changes, confusion, nervousness, sleep disturbance and agitation    Physical Exam: Vitals:   10/29/18 1522  BP: 140/80  Pulse: 86  Temp: 97.9 F (36.6 C)  TempSrc: Oral  SpO2: 96%  Weight: 230 lb 1.6 oz (104.4 kg)    Body mass index is 32.09 kg/m.   Constitutional: NAD, calm, comfortable Eyes: PERRL, lids and conjunctivae normal ENMT: Mucous membranes are moist.  Musculoskeletal: left ankle is swollen, erythematous, no particularly painful to palpation of int and ext maleolus. Skin: no rashes, lesions, ulcers. No induration Psychiatric: Normal judgment and insight. Alert and oriented x 3. Normal mood.    Impression and Plan:  Acute gout of left ankle, unspecified cause  -Suspect gout. -Colchicine BID, info on low purine diet given. -Advised to RTC if no improvement in 1 week.    Patient Instructions  -Hope you feel better soon!  -Use colchicine 2 times a day for the next 3 days and then as needed for pain. Come back to see [name] if not better within 5-8 days.   Gout  Gout is painful swelling of your joints. Gout is a type of arthritis. It is caused by having too much uric acid in your body. Uric acid is a chemical that is made when your body breaks down substances called purines. If your body has too much uric acid, sharp crystals can form and build up in your joints. This causes pain and swelling. Gout attacks can happen quickly and be very painful (acute gout). Over time, the attacks can affect more joints and happen more often (chronic gout). What are the causes?  Too much uric acid in your blood. This can happen because: ? Your kidneys do not remove enough uric acid from your blood. ? Your body makes too much uric acid. ? You eat too many foods that are high in purines. These foods include organ meats, some seafood, and  beer.  Trauma or stress. What increases the risk?  Having a family history of gout.  Being male and middle-aged.  Being male and having gone through menopause.  Being very overweight (obese).  Drinking alcohol, especially beer.  Not having enough water in the body (being dehydrated).  Losing weight too quickly.  Having an organ transplant.  Having lead poisoning.  Taking certain medicines.  Having kidney disease.  Having a skin condition called psoriasis. What are the signs or symptoms? An attack of acute gout usually happens in just one joint. The most common place is the big toe. Attacks often start at night. Other joints that may be affected include joints of the feet, ankle, knee, fingers, wrist, or elbow. Symptoms of an attack may include:  Very bad pain.  Warmth.  Swelling.  Stiffness.  Shiny, red, or purple skin.  Tenderness. The affected joint may be very painful to touch.  Chills and fever.  Chronic gout may cause symptoms more often. More joints may be involved. You may also have white or yellow lumps (tophi) on your hands or feet or in other areas near your joints. How is this treated?  Treatment for this condition has two phases: treating an acute attack and preventing future attacks.  Acute gout treatment may include: ? NSAIDs. ? Steroids. These are taken by mouth or injected into a joint. ? Colchicine. This medicine relieves pain and swelling. It can be given by mouth or through an IV tube.  Preventive treatment may include: ? Taking small doses of NSAIDs or colchicine daily. ? Using a medicine that reduces uric acid levels in your blood. ? Making changes to your diet. You may need to see a food expert (dietitian) about what to eat and drink to prevent gout. Follow these instructions at home: During a gout attack   If told, put ice on the painful area: ? Put ice in a plastic bag. ? Place a towel between your skin and the bag. ? Leave the  ice on for 20 minutes, 2-3 times a day.  Raise (elevate) the painful joint above the level of your heart as often as you can.  Rest the joint as much as possible. If the joint is in your leg, you may be given crutches.  Follow instructions from your doctor about what you cannot eat or drink. Avoiding future gout attacks  Eat a low-purine diet. Avoid foods and drinks such as: ? Liver. ? Kidney. ? Anchovies. ? Asparagus. ? Herring. ? Mushrooms. ? Mussels. ? Beer.  Stay at a healthy weight. If you want to lose weight, talk with your doctor. Do not lose weight too fast.  Start or continue an exercise plan as told by your doctor. Eating and drinking  Drink enough fluids to keep your pee (urine) pale yellow.  If you drink alcohol: ? Limit how much you use to:  0-1 drink a day for women.  0-2 drinks a day for men. ? Be aware of how much alcohol is in your drink. In the [place] one drink equals one 12 oz bottle of beer (355 mL), one 5 oz glass of wine (148 mL), or one 1 oz glass of hard liquor (44 mL). General instructions  Take over-the-counter and prescription medicines only as told by your doctor.  Do not drive or use heavy machinery while taking prescription pain medicine.  Return to your normal activities as told by your doctor. Ask your doctor what activities are safe for you.  Keep all follow-up visits as told by your doctor. This is important. Contact a doctor if:  You have another gout attack.  You still have symptoms of a gout attack after 10 days of treatment.  You have problems (side effects) because of your medicines.  You have chills or a fever.  You have burning pain when you pee (urinate).  You have pain in your lower back or belly. Get help right away if:  You have very bad pain.  Your pain cannot be controlled.  You cannot pee. Summary  Gout is painful swelling of the joints.  The most common site of pain is the big toe, but it can affect  other joints.  Medicines and avoiding some foods can help to prevent and treat gout attacks. This information is not intended to replace advice given to you by your health care provider. Make sure you discuss any questions you have with your health care provider.  Document Released: 05/28/2008 Document Revised: 03/11/2018 Document Reviewed: 03/11/2018 Elsevier Interactive Patient Education  2019 [place] A low-purine eating plan involves making food choices to limit your intake of purine. Purine is a kind of uric acid. Too much uric acid in your blood can cause certain conditions, such as gout and kidney stones. Eating a low-purine diet can help control these conditions. What are tips for following this plan? Reading food labels   Avoid foods with saturated or Trans fat.  Check the ingredient list of grains-based foods, such as bread and cereal, to make sure that they contain whole grains.  Check the ingredient list of sauces or soups to make sure they do not contain meat or fish.  When choosing soft drinks, check the ingredient list to make sure they do not contain high-fructose corn syrup. Shopping  Buy plenty of fresh fruits and vegetables.  Avoid buying canned or fresh fish.  Buy dairy products labeled as low-fat or nonfat.  Avoid buying premade or processed foods. These foods are often high in fat, salt (sodium), and added sugar. Cooking  Use olive oil instead of butter when cooking. Oils like olive oil, canola oil, and sunflower oil contain healthy fats. Meal planning  Learn which foods do or do not affect you. If you find out that a food tends to cause your gout symptoms to flare up, avoid eating that food. You can enjoy foods that do not cause problems. If you have any questions about a food item, talk with your dietitian or health care provider.  Limit foods high in fat, especially saturated fat. Fat makes it harder for your body to get  rid of uric acid.  Choose foods that are lower in fat and are lean sources of protein. General guidelines  Limit alcohol intake to no more than 1 drink a day for nonpregnant women and 2 drinks a day for men. One drink equals 12 oz of beer, 5 oz of wine, or 1 oz of hard liquor. Alcohol can affect the way your body gets rid of uric acid.  Drink plenty of water to keep your urine clear or pale yellow. Fluids can help remove uric acid from your body.  If directed by your health care provider, take a vitamin C supplement.  Work with your health care provider and dietitian to develop a plan to achieve or maintain a healthy weight. Losing weight can help reduce uric acid in your blood. What foods are recommended? The items listed may not be a complete list. Talk with your dietitian about what dietary choices are best for you. Foods low in purines Foods low in purines do not need to be limited. These include:  All fruits.  All low-purine vegetables, pickles, and olives.  Breads, pasta, rice, cornbread, and popcorn. Cake and other baked goods.  All dairy foods.  Eggs, nuts, and nut butters.  Spices and condiments, such as salt, herbs, and vinegar.  Plant oils, butter, and margarine.  Water, sugar-free soft drinks, tea, coffee, and cocoa.  Vegetable-based soups, broths, sauces, and gravies. Foods moderate in purines Foods moderate in purines should be limited to the amounts listed.   cup of asparagus, cauliflower, spinach, mushrooms, or green peas, each day.  2/3 cup uncooked oatmeal, each day.   cup dry wheat bran or wheat germ, each day.  2-3 ounces of meat or poultry, each day.  4-6 ounces of shellfish, such as crab, lobster, oysters, or shrimp, each  day.  1 cup cooked beans, peas, or lentils, each day.  Soup, broths, or bouillon made from meat or fish. Limit these foods as much as possible. What foods are not recommended? The items listed may not be a complete list.  Talk with your dietitian about what dietary choices are best for you. Limit your intake of foods high in purines, including:  Beer and other alcohol.  Meat-based gravy or sauce.  Canned or fresh fish, such as: ? Anchovies, sardines, herring, and tuna. ? Mussels and scallops. ? Codfish, trout, and haddock.  Berniece Salines.  Organ meats, such as: ? Liver or kidney. ? Tripe. ? Sweetbreads (thymus gland or pancreas).  Wild Clinical biochemist.  Yeast or yeast extract supplements.  Drinks sweetened with high-fructose corn syrup. Summary  Eating a low-purine diet can help control conditions caused by too much uric acid in the body, such as gout or kidney stones.  Choose low-purine foods, limit alcohol, and limit foods high in fat.  You will learn over time which foods do or do not affect you. If you find out that a food tends to cause your gout symptoms to flare up, avoid eating that food. This information is not intended to replace advice given to you by your health care provider. Make sure you discuss any questions you have with your health care provider. Document Released: 12/14/2010 Document Revised: 10/02/2016 Document Reviewed: 10/02/2016 Elsevier Interactive Patient Education  2019 [place], MD [facility]

## 2019-02-22 ENCOUNTER — Telehealth: Payer: Medicare Other | Admitting: Cardiovascular Disease

## 2019-02-26 ENCOUNTER — Telehealth: Payer: Medicare Other | Admitting: Physician Assistant

## 2019-03-01 ENCOUNTER — Other Ambulatory Visit: Payer: Self-pay | Admitting: Cardiovascular Disease

## 2019-03-01 MED ORDER — ROSUVASTATIN CALCIUM 20 MG PO TABS: 20.0000 mg | ORAL_TABLET | Freq: Every day | ORAL | 0 refills | Status: DC

## 2019-03-01 NOTE — Telephone Encounter (Signed)
New Message     *STAT* If patient is at the pharmacy, call can be transferred to refill team.   1. Which medications need to be refilled? (please list name of each medication and dose if known) Rosuvastatin  2. Which pharmacy/location (including street and city if local pharmacy) is medication to be sent to? [facility] on [address]  3. Do they need a 30 day or 90 day supply? 90 day supply

## 2019-03-01 NOTE — Telephone Encounter (Signed)
Refill for rosuvastatin sent to [facility] in [place] as requested.

## 2019-03-31 ENCOUNTER — Encounter: Payer: Self-pay | Admitting: Physician Assistant

## 2019-03-31 ENCOUNTER — Ambulatory Visit (INDEPENDENT_AMBULATORY_CARE_PROVIDER_SITE_OTHER): Payer: Medicare Other | Admitting: Physician Assistant

## 2019-03-31 ENCOUNTER — Other Ambulatory Visit: Payer: Self-pay

## 2019-03-31 VITALS — BP 128/80 | HR 76 | Temp 98.1°F | Ht 71.0 in | Wt 226.0 lb

## 2019-03-31 DIAGNOSIS — I2510 Atherosclerotic heart disease of native coronary artery without angina pectoris: Secondary | ICD-10-CM | POA: Diagnosis not present

## 2019-03-31 DIAGNOSIS — I252 Old myocardial infarction: Secondary | ICD-10-CM

## 2019-03-31 DIAGNOSIS — G4733 Obstructive sleep apnea (adult) (pediatric): Secondary | ICD-10-CM

## 2019-03-31 DIAGNOSIS — E785 Hyperlipidemia, unspecified: Secondary | ICD-10-CM

## 2019-03-31 NOTE — Patient Instructions (Signed)
Medication Instructions:  Your physician recommends that you continue on your current medications as directed. Please refer to the Current Medication list given to you today.  If you need a refill on your cardiac medications before your next appointment, please call your pharmacy.   Follow-Up: At [facility], you and your health needs are our priority.  As part of our continuing mission to provide you with exceptional heart care, we have created designated Provider Care Teams.  These Care Teams include your primary Cardiologist (physician) and Advanced Practice Providers (APPs -  Physician Assistants and Nurse Practitioners) who all work together to provide you with the care you need, when you need it. You will need a follow up appointment in 6 months.  Please call our office 2 months in advance to schedule this appointment.  You may see Shelva Majestic, MD or one of the following Advanced Practice Providers on your designated Care Team: [place], [place] . Fabian Sharp, PA-C  Any Other Special Instructions Will Be Listed Below (If Applicable). None

## 2019-03-31 NOTE — Progress Notes (Signed)
Cardiology Office Note:    Date:  03/31/2019   ID:  Jon Carter, DOB 1950/09/04, MRN [id number]  PCP:  Burnis Medin, MD  Cardiologist:  Shelva Majestic, MD   Referring MD: Burnis Medin, MD   Chief Complaint  Patient presents with  . Follow-up    History of Present Illness:    Jon Carter is a 68 y.o. male with a hx of coronary atherosclerosis and STEMI in 2010 s/p DES to Cx, HLD, OSA on CPAP, and chronic thrombocytopenia (has been worked up by hematology at [facility]). Stress test in 2013 negative for ischemia. He does have a history of atypical chest pain discovered to be costochondritis. He is very active and travels extensively internationally. He is retired and used to work as an Air traffic controller.   He presents today for a 6 month follow up.  He is here with his wife who is also being seen today for cardiology follow up. He remains active. He had an ache in his left arm yesterday after holding his smartphone for several hours while watching a movie (internet was down in their house for the evening). It sounds consistent with compression of his ulnar nerve, which is completely resolved today. No CP, SOB, edema, orthopnea, nausea, or diaphoresis. Overall, he is doing quite well.   Past Medical History:  Diagnosis Date  . Adjustment disorder with anxiety 06/13/2009   Qualifier: Diagnosis of  By: Regis Bill MD, Stand[place] Brooking  related to his sudden MI underwent counseling takes low-dose medication as needed had side effects from an SSRI   . Alcohol addiction ([place])   . Hyperlipidemia    Hx of low HDL zero CAC in 2000 at [place]  . Low testosterone   . STEMI (ST elevation myocardial infarction) ([place]) 04/26/2009   left circumflex DES     Past Surgical History:  Procedure Laterality Date  . CARDIAC CATHETERIZATION  05/02/2009   UA - patent Cfx stent, 50-60% LAD, 50-60% RCA with normal LV funtion (Dr. Adora Fridge)  . CATARACT EXTRACTION Left 2009   [place]  . CORONARY ANGIOPLASTY WITH  STENT PLACEMENT  04/26/2009   ACS/STEMI - total occlusion of mid Cfx - Xience DES 2.75x9mm (Dr. Corky Downs)  . NM MYOCAR PERF WALL MOTION  04/2012   bruce myoview; mild perfusion defect in basal inferior region (infarct/scar or overlying attenuation); post-stress EF 53%, EKG negative for ischemia, hypertensive response to exercise; abnormal study but unchanged from previous study  . TRANSTHORACIC ECHOCARDIOGRAM  05/2009   EF=50-55%; trace MR; mild TR, normal RSVP; AV mildly sclerotic    Current Medications: Current Meds  Medication Sig  . aspirin 81 MG tablet Take 81 mg by mouth daily.    . Coenzyme Q10 150 MG CAPS Take 150 mg by mouth daily.   . Colchicine 0.6 MG CAPS Take 1 capsule by mouth 2 (two) times daily.  . nitroGLYCERIN (NITROSTAT) 0.4 MG SL tablet Place 1 tablet (0.4 mg total) under the tongue every 5 (five) minutes as needed.  . rosuvastatin (CRESTOR) 20 MG tablet Take 1 tablet (20 mg total) by mouth daily.  . valACYclovir (VALTREX) 1000 MG tablet Take 2 tablets (2,000 mg total) by mouth 2 (two) times daily. As needed for cold sores.     Allergies:   Patient has no known allergies.   Social History   Socioeconomic History  . Marital status: Married    Spouse name: Not on file  . Number of children: Not on  file  . Years of education: Not on file  . Highest education level: Not on file  Occupational History  . Occupation: phd Copywriter, advertising: [employer]  . Financial resource strain: Not on file  . Food insecurity    Worry: Not on file    Inability: Not on file  . Transportation needs    Medical: Not on file    Non-medical: Not on file  Tobacco Use  . Smoking status: Never Smoker  . Smokeless tobacco: Never Used  Substance and Sexual Activity  . Alcohol use: Yes    Alcohol/week: 2.0 standard drinks    Types: 2 Standard drinks or equivalent per week  . Drug use: No  . Sexual activity: Not on file  Lifestyle  . Physical activity     Days per week: Not on file    Minutes per session: Not on file  . Stress: Not on file  Relationships  . Social Herbalist on phone: Not on file    Gets together: Not on file    Attends religious service: Not on file    Active member of club or organization: Not on file    Attends meetings of clubs or organizations: Not on file    Relationship status: Not on file  Other Topics Concern  . Not on file  Social History Narrative   Designated Party Release signed on 02/05/2010     [phone]    married works at [employer] retired Therapist, art reserve PhD Surveyor, quantity   never smoked regular exercise household  3    no pets   2-3 beverages per day wine      Retired may 18              Family History: The patient's family history includes Cirrhosis in his father; Heart Problems in his unknown relative; Hypertension in his mother; Throat cancer in his father; Tongue cancer in his father.  ROS:   Please see the history of present illness.     All other systems reviewed and are negative.  EKGs/Labs/Other Studies Reviewed:    The following studies were reviewed today:  none  EKG:  EKG is ordered today.  The ekg ordered today demonstrates sinus rhythm HR 76  Recent Labs: 10/21/2018: ALT 36; BUN 16; Creatinine, Ser 0.93; Hemoglobin 15.6; Platelets 56; Potassium 4.2; Sodium 138; TSH 4.30  Recent Lipid Panel    Component Value Date/Time   CHOL 80 10/21/2018 1129   TRIG 176.0 (H) 10/21/2018 1129   TRIG 347 (HH) 07/17/2006 1010   HDL 23.30 (L) 10/21/2018 1129   CHOLHDL 3 10/21/2018 1129   VLDL 35.2 10/21/2018 1129   LDLCALC 22 10/21/2018 1129   LDLDIRECT 128.7 01/04/2009 0733    Physical Exam:    VS:  BP 128/80   Pulse 76   Temp 98.1 F (36.7 C)   Ht 5\' 11"  (1.803 m)   Wt 226 lb (102.5 kg)   BMI 31.52 kg/m     Wt Readings from Last 3 Encounters:  03/31/19 226 lb (102.5 kg)  10/29/18 230 lb 1.6 oz (104.4 kg)  10/21/18 227 lb 12.8 oz (103.3 kg)     GEN: Well  nourished, well developed in no acute distress HEENT: Normal NECK: No JVD; No carotid bruits LYMPHATICS: No lymphadenopathy CARDIAC: RRR, no murmurs, rubs, gallops RESPIRATORY:  Clear to auscultation without rales, wheezing or rhonchi  ABDOMEN: Soft, non-tender, non-distended  MUSCULOSKELETAL:  No edema; No deformity  SKIN: Warm and dry NEUROLOGIC:  Alert and oriented x 3 PSYCHIATRIC:  Normal affect   ASSESSMENT:    1. Atherosclerosis of native coronary artery of native heart without angina pectoris   2. Hyperlipidemia, unspecified hyperlipidemia type   3. Old MI (myocardial infarction)   4. OSA (obstructive sleep apnea)    PLAN:    In order of problems listed above:  CAD s/p STEMI and DES to Cx 2010 On ASA and statin. No recurrent anginal symptoms. Not on BB or ACEI, per Dr. Claiborne Billings.   Chronic thrombocytopenia Is being evaluated by hematology at [facility]. Is taking 81 mg ASA without bleeding problems. I will continue ASA for now given his CAD and stent.    Hyperlipidemia 10/21/2018: Cholesterol 80; HDL 23.30; LDL Cholesterol 22; Triglycerides 176.0; VLDL 35.2 Continue crestor.    OSA on CPAP Stable.    Follow up with Dr. Claiborne Billings in 6 months.   Medication Adjustments/Labs and Tests Ordered: Current medicines are reviewed at length with the patient today.  Concerns regarding medicines are outlined above.  Orders Placed This Encounter  Procedures  . EKG 12-Lead   No orders of the defined types were placed in this encounter.   Signed, Ledora Bottcher, [place]  03/31/2019 4:35 PM    [facility]

## 2019-04-06 ENCOUNTER — Encounter: Payer: Self-pay | Admitting: Internal Medicine

## 2019-04-06 ENCOUNTER — Telehealth (INDEPENDENT_AMBULATORY_CARE_PROVIDER_SITE_OTHER): Payer: Medicare Other | Admitting: Internal Medicine

## 2019-04-06 ENCOUNTER — Other Ambulatory Visit: Payer: Self-pay

## 2019-04-06 ENCOUNTER — Ambulatory Visit: Payer: Medicare Other | Admitting: Physician Assistant

## 2019-04-06 DIAGNOSIS — M79602 Pain in left arm: Secondary | ICD-10-CM

## 2019-04-06 NOTE — Progress Notes (Signed)
Virtual Visit via Video Note  I connected with@ on 04/06/19 at  2:00 PM EDT by a video enabled telemedicine application and verified that I am speaking with the correct person using two identifiers. Location patient: home Location provider:work  office Persons participating in the virtual visit: patient, provider  WIth national recommendations  regarding COVID 19 pandemic   video visit is advised over in office visit for this patient.  Patient aware  of the limitations of evaluation and management by telemedicine and  availability of in person appointments. and agreed to proceed.   HPI: Jon Carter presents for video visit  For new problem  Left arm pain  Achy burn left lateral arm without weakness  Seems to bother hiim at night not association  with cp sob or exercise . Up and around  Not worse  No injury but has been sitting back in chair and using screen  Phone for 3 days when Internet was out.   H had concern since consistent for 9 days   No weakness or associated sx .  Had f/ u cad cards  July 29  And felt was not cv  No injury  ROS: See pertinent positives and negatives per HPI.  Past Medical History:  Diagnosis Date  . Adjustment disorder with anxiety 06/13/2009   Qualifier: Diagnosis of  By: Regis Bill MD, Standley Brooking  related to his sudden MI underwent counseling takes low-dose medication as needed had side effects from an SSRI   . Alcohol addiction ([place])   . Hyperlipidemia    Hx of low HDL zero CAC in 2000 at [place]  . Low testosterone   . STEMI (ST elevation myocardial infarction) ([place]) 04/26/2009   left circumflex DES     Past Surgical History:  Procedure Laterality Date  . CARDIAC CATHETERIZATION  05/02/2009   UA - patent Cfx stent, 50-60% LAD, 50-60% RCA with normal LV funtion (Dr. Adora Fridge)  . CATARACT EXTRACTION Left 2009   [facility]  . CORONARY ANGIOPLASTY WITH STENT PLACEMENT  04/26/2009   ACS/STEMI - total occlusion of mid Cfx - Xience DES 2.75x27mm (Dr. Corky Downs)   . NM MYOCAR PERF WALL MOTION  04/2012   bruce myoview; mild perfusion defect in basal inferior region (infarct/scar or overlying attenuation); post-stress EF 53%, EKG negative for ischemia, hypertensive response to exercise; abnormal study but unchanged from previous study  . TRANSTHORACIC ECHOCARDIOGRAM  05/2009   EF=50-55%; trace MR; mild TR, normal RSVP; AV mildly sclerotic    Family History  Problem Relation Age of Onset  . Tongue cancer Father        tobacco  . Throat cancer Father   . Cirrhosis Father        also heart problems from etoh   . Hypertension Mother   . Heart Problems Unknown        mom  in 39s     Social History   Tobacco Use  . Smoking status: Never Smoker  . Smokeless tobacco: Never Used  Substance Use Topics  . Alcohol use: Yes    Alcohol/week: 2.0 standard drinks    Types: 2 Standard drinks or equivalent per week  . Drug use: No      Current Outpatient Medications:  .  aspirin 81 MG tablet, Take 81 mg by mouth daily.  , Disp: , Rfl:  .  Coenzyme Q10 150 MG CAPS, Take 150 mg by mouth daily. , Disp: , Rfl:  .  Colchicine 0.6 MG  CAPS, Take 1 capsule by mouth 2 (two) times daily., Disp: 45 capsule, Rfl: 0 .  nitroGLYCERIN (NITROSTAT) 0.4 MG SL tablet, Place 1 tablet (0.4 mg total) under the tongue every 5 (five) minutes as needed., Disp: 5 tablet, Rfl: 6 .  rosuvastatin (CRESTOR) 20 MG tablet, Take 1 tablet (20 mg total) by mouth daily., Disp: 90 tablet, Rfl: 0 .  valACYclovir (VALTREX) 1000 MG tablet, Take 2 tablets (2,000 mg total) by mouth 2 (two) times daily. As needed for cold sores., Disp: 30 tablet, Rfl: 2  EXAM: BP Readings from Last 3 Encounters:  03/31/19 128/80  10/29/18 140/80  10/21/18 124/76    VITALS per patient if applicable:  GENERAL: alert, oriented, appears well and in no acute distress HEENT: atraumatic, conjunttiva clear, no obvious abnormalities on inspection of external nose and ears NECK: normal movements of the head and  neck LUNGS: on inspection no signs of respiratory distress, breathing rate appears normal, no obvious gross SOB, gasping or wheezing CV: no obvious cyanosis MS: moves all visible extremities without noticeable abnormality shoulder rom is normal left shoulder   Points to upper lateral arm as are of issue  No mass in neck  PSYCH/NEURO: pleasant and cooperative, no obvious depression or anxiety, speech and thought processing grossly intact Lab Results  Component Value Date   WBC 9.1 10/21/2018   HGB 15.6 10/21/2018   HCT 48.2 10/21/2018   PLT 56 (L) 10/21/2018   GLUCOSE 87 10/21/2018   CHOL 80 10/21/2018   TRIG 176.0 (H) 10/21/2018   HDL 23.30 (L) 10/21/2018   LDLDIRECT 128.7 01/04/2009   LDLCALC 22 10/21/2018   ALT 36 10/21/2018   AST 30 10/21/2018   NA 138 10/21/2018   K 4.2 10/21/2018   CL 102 10/21/2018   CREATININE 0.93 10/21/2018   BUN 16 10/21/2018   CO2 28 10/21/2018   TSH 4.30 10/21/2018   PSA 0.38 10/21/2018   INR 1.1 05/01/2009    ASSESSMENT AND PLAN:  Discussed the following assessment and plan:    ICD-10-CM   1. Pain in left arm  M79.602    Left arm pain    Sounds like   Ms vs neuritis   Sx but no other alarm sx  Could be from  c spine or other  But doesn't sound cv pulm  Will follow ok to use tylenol and   Cont activity and stretching and offered musc relaxant at night   If needed.   And if  persistent or progressive consider     sm eval  If needed   Counseled.   Expectant management and discussion of plan and treatment with opportunity to ask questions and all were answered. The patient agreed with the plan and demonstrated an understanding of the instructions.   Advised to call back or seek an in-person evaluation if worsening  or having  further concerns .  Shanon Ace, MD

## 2019-05-31 ENCOUNTER — Other Ambulatory Visit: Payer: Medicare Other

## 2019-05-31 ENCOUNTER — Other Ambulatory Visit (INDEPENDENT_AMBULATORY_CARE_PROVIDER_SITE_OTHER): Payer: Medicare Other

## 2019-05-31 ENCOUNTER — Other Ambulatory Visit: Payer: Self-pay

## 2019-05-31 DIAGNOSIS — D691 Qualitative platelet defects: Secondary | ICD-10-CM

## 2019-05-31 LAB — CBC WITH DIFFERENTIAL/PLATELET
Basophils Absolute: 0.1 10*3/uL (ref 0.0–0.1)
Basophils Relative: 0.4 % (ref 0.0–3.0)
Eosinophils Absolute: 0 10*3/uL (ref 0.0–0.7)
Eosinophils Relative: 0.1 % (ref 0.0–5.0)
HCT: 43.5 % (ref 39.0–52.0)
Hemoglobin: 14.5 g/dL (ref 13.0–17.0)
Lymphocytes Relative: 15.5 % (ref 12.0–46.0)
Lymphs Abs: 2.4 10*3/uL (ref 0.7–4.0)
MCHC: 33.4 g/dL (ref 30.0–36.0)
MCV: 89.2 fl (ref 78.0–100.0)
Monocytes Absolute: 8 10*3/uL — ABNORMAL HIGH (ref 0.1–1.0)
Monocytes Relative: 51.9 % — ABNORMAL HIGH (ref 3.0–12.0)
Neutro Abs: 5 10*3/uL (ref 1.4–7.7)
Neutrophils Relative %: 32.1 % — ABNORMAL LOW (ref 43.0–77.0)
Platelets: 50 10*3/uL — ABNORMAL LOW (ref 150.0–400.0)
RBC: 4.88 Mil/uL (ref 4.22–5.81)
RDW: 14.5 % (ref 11.5–15.5)
WBC: 15.4 10*3/uL — ABNORMAL HIGH (ref 4.0–10.5)

## 2019-06-03 ENCOUNTER — Other Ambulatory Visit: Payer: Self-pay

## 2019-06-03 DIAGNOSIS — D691 Qualitative platelet defects: Secondary | ICD-10-CM

## 2019-06-04 NOTE — Telephone Encounter (Signed)
Good question   50 K is usually the cut off for low risk procedure  Assuming no large polyp removal  .   But not sure  Need to ask the  Provider doing the colonoscopy if need to delay.     Who is that   Please contact them and I can send copies of our labs

## 2019-06-09 ENCOUNTER — Telehealth: Payer: Self-pay

## 2019-06-09 NOTE — Telephone Encounter (Signed)
Spoke with pt regarding the following mychart message:  "I'm scheduled for a Colonoscopy on the 12 October.  My "Platelets" count from a recent lab work is at 50.0. Low. (Calling DR Medoff to see if this is a problem.)  Should I stop taking aspirin 83mg  and Fish Oil for my prep??  Please call me to advise.  [place]"   Pt states that he spoke with his PCP Dr. Regis Bill and he was advised to postpone his colonoscopy until after repeat CBC results. He also states that his PCP may refer him to a hematologist. Pt concerned if he should stop taking his ASA 81 mg and fish oil.  Consulted pharmD and nurse was advised to consult MD on if pt okay to remain on ASA

## 2019-06-10 NOTE — Telephone Encounter (Signed)
With history of prior stent, would probably not stop taking baby aspirin.

## 2019-06-10 NOTE — Telephone Encounter (Signed)
Notified patient of this via mychart.  Patient responding with understanding. Will notify once procedure is rescheduled.

## 2019-06-10 NOTE — Telephone Encounter (Signed)
Will route to Dr.Kelly to make aware as well. Thank you, to advise if patient should come of ASA for now since procedure is postponed.

## 2019-06-10 NOTE — Telephone Encounter (Signed)
So would limit alcohol intake.  Alcohol intake can sometimes lower platelet count in some people.

## 2019-06-13 NOTE — Telephone Encounter (Signed)
His stent placement was in 2010 and he has been stable cardiac wise.  If his platelet counts are remaining at 50,000, it may be advisable to hold the aspirin for 2-3 days prior to the planned colonoscopy procedure

## 2019-07-12 ENCOUNTER — Other Ambulatory Visit (INDEPENDENT_AMBULATORY_CARE_PROVIDER_SITE_OTHER): Payer: Medicare Other

## 2019-07-12 ENCOUNTER — Other Ambulatory Visit: Payer: Medicare Other

## 2019-07-12 ENCOUNTER — Telehealth: Payer: Self-pay

## 2019-07-12 ENCOUNTER — Other Ambulatory Visit: Payer: Self-pay

## 2019-07-12 DIAGNOSIS — D691 Qualitative platelet defects: Secondary | ICD-10-CM

## 2019-07-12 DIAGNOSIS — D72829 Elevated white blood cell count, unspecified: Secondary | ICD-10-CM

## 2019-07-12 LAB — CBC WITH DIFFERENTIAL/PLATELET
Basophils Absolute: 0.1 10*3/uL (ref 0.0–0.1)
Basophils Relative: 0.6 % (ref 0.0–3.0)
Eosinophils Absolute: 0 10*3/uL (ref 0.0–0.7)
Eosinophils Relative: 0.1 % (ref 0.0–5.0)
HCT: 44 % (ref 39.0–52.0)
Hemoglobin: 14.7 g/dL (ref 13.0–17.0)
Lymphocytes Relative: 13.6 % (ref 12.0–46.0)
Lymphs Abs: 2.5 10*3/uL (ref 0.7–4.0)
MCHC: 33.3 g/dL (ref 30.0–36.0)
MCV: 90 fl (ref 78.0–100.0)
Monocytes Absolute: 9 10*3/uL — ABNORMAL HIGH (ref 0.1–1.0)
Monocytes Relative: 49.3 % — ABNORMAL HIGH (ref 3.0–12.0)
Neutro Abs: 6.6 10*3/uL (ref 1.4–7.7)
Neutrophils Relative %: 36.4 % — ABNORMAL LOW (ref 43.0–77.0)
Platelets: 39 10*3/uL — CL (ref 150.0–400.0)
RBC: 4.89 Mil/uL (ref 4.22–5.81)
RDW: 15.2 % (ref 11.5–15.5)
WBC: 18.2 10*3/uL (ref 4.0–10.5)

## 2019-07-12 NOTE — Addendum Note (Signed)
Addended by: Modena Morrow R on: 07/12/2019 04:43 PM   Modules accepted: Orders

## 2019-07-12 NOTE — Telephone Encounter (Signed)
Critical lab of   white count of 18.2  platelet count of 39

## 2019-07-13 ENCOUNTER — Telehealth: Payer: Self-pay | Admitting: Oncology

## 2019-07-13 NOTE — Telephone Encounter (Signed)
Scheduled apt per 11/10 sch message - pt is aware of appt date and time .

## 2019-07-13 NOTE — Telephone Encounter (Signed)
See result note  I called patient and he has no symptoms   Refer to dr Brent General al

## 2019-07-14 ENCOUNTER — Inpatient Hospital Stay: Payer: Medicare Other | Attending: Oncology | Admitting: Oncology

## 2019-07-14 ENCOUNTER — Other Ambulatory Visit: Payer: Self-pay

## 2019-07-14 VITALS — BP 136/75 | HR 73 | Temp 98.7°F | Resp 17 | Ht 71.0 in | Wt 222.9 lb

## 2019-07-14 DIAGNOSIS — D72821 Monocytosis (symptomatic): Secondary | ICD-10-CM | POA: Insufficient documentation

## 2019-07-14 DIAGNOSIS — D696 Thrombocytopenia, unspecified: Secondary | ICD-10-CM | POA: Diagnosis present

## 2019-07-14 NOTE — Progress Notes (Signed)
Hematology and Oncology Follow Up Visit  Jon Carter [id number] [birth year] 68 y.o. 07/14/2019 12:35 PM Carter, Jon Brooking, MDPanosh, Jon Brooking, MD   Principle Diagnosis: 68 year old man with thrombocytopenia and monocytosis dating back to 2013.  The differential diagnosis including myelodysplastic syndrome versus reactive or autoimmune findings.  Current therapy: Active surveillance.  Interim History: Jon Carter returns today for a repeat evaluation.  He is a pleasant gentleman I saw in consultation in 2018 for thrombocytopenia.  He has been doing reasonably well without any major complaints but his platelets continues to drop in the last 2 years.  His most recent CBC on 07/12/2019 showed a platelet count of 39 with a white cell count of 18.  His previous count in February 2020 was 24 and in 2018 he was around 69,000.  He is white cell count differential showed monocytosis which has been consistent for many years.  He denies any recent hospitalizations or infections.  He denied any constitutional symptoms.  He denies any hematochezia, melena or epistaxis.  Denies any spontaneous bruising.  He did have slightly excessive bruising with trauma.  He continues to be active and attends activities of daily living.   He does not report any headaches, blurry vision, syncope or seizures. Does not report any fevers, chills or sweats.  Does not report any cough, wheezing or hemoptysis.  Does not report any chest pain, palpitation, orthopnea or leg edema.  Does not report any nausea, vomiting or abdominal pain.  Does not report any constipation or diarrhea.  Does not report any skeletal complaints.    Does not report frequency, urgency or hematuria.  Does not report any skin rashes or lesions. Does not report any heat or cold intolerance.  Does not report any lymphadenopathy or petechiae.  Does not report any anxiety or depression.  Remaining review of systems is negative.    Medications: I have reviewed the  patient's current medications.  Current Outpatient Medications  Medication Sig Dispense Refill  . aspirin 81 MG tablet Take 81 mg by mouth daily.      . Coenzyme Q10 150 MG CAPS Take 150 mg by mouth daily.     . Colchicine 0.6 MG CAPS Take 1 capsule by mouth 2 (two) times daily. 45 capsule 0  . nitroGLYCERIN (NITROSTAT) 0.4 MG SL tablet Place 1 tablet (0.4 mg total) under the tongue every 5 (five) minutes as needed. 5 tablet 6  . rosuvastatin (CRESTOR) 20 MG tablet Take 1 tablet (20 mg total) by mouth daily. 90 tablet 0  . valACYclovir (VALTREX) 1000 MG tablet Take 2 tablets (2,000 mg total) by mouth 2 (two) times daily. As needed for cold sores. 30 tablet 2   No current facility-administered medications for this visit.      Allergies: No Known Allergies  Past Medical History, Surgical history, Social history, and Family History were reviewed and updated.   Physical Exam: Blood pressure 136/75, pulse 73, temperature 98.7 F (37.1 C), temperature source Temporal, resp. rate 17, height '5\' 11"'$  (1.803 m), weight 222 lb 14.4 oz (101.1 kg), SpO2 100 %. ECOG: 0 General appearance: alert and cooperative appeared without distress. Head: Normocephalic, without obvious abnormality Oropharynx: No oral thrush or ulcers. Eyes: No scleral icterus.  Pupils are equal and round reactive to light. Lymph nodes: Cervical, supraclavicular, and axillary nodes normal. Heart:regular rate and rhythm, S1, S2 normal, no murmur, click, rub or gallop Lung:chest clear, no wheezing, rales, normal symmetric air entry Abdomin: soft, non-tender, without masses or  organomegaly. Neurological: No motor, sensory deficits.  Intact deep tendon reflexes. Skin: No petechia or rash noted.  Very little ecchymosis noted on his arm and around the phlebotomy site.. Musculoskeletal: No joint deformity or effusion. Psychiatric: Mood and affect are appropriate.    Lab Results: Lab Results  Component Value Date   WBC 18.2  Repeated and verified X2. (HH) 07/12/2019   HGB 14.7 07/12/2019   HCT 44.0 07/12/2019   MCV 90.0 07/12/2019   PLT 39.0 Repeated and verified X2. (LL) 07/12/2019     Chemistry      Component Value Date/Time   NA 138 10/21/2018 1129   K 4.2 10/21/2018 1129   CL 102 10/21/2018 1129   CO2 28 10/21/2018 1129   BUN 16 10/21/2018 1129   CREATININE 0.93 10/21/2018 1129   CREATININE 0.85 03/31/2015 1333      Component Value Date/Time   CALCIUM 9.3 10/21/2018 1129   ALKPHOS 38 (L) 10/21/2018 1129   AST 30 10/21/2018 1129   ALT 36 10/21/2018 1129   BILITOT 0.8 10/21/2018 1129        Impression and Plan:   68 year old with:  1.  Thrombocytopenia dating back to at least 2013 with fluctuating counts even prior to that.  Is most recent platelet count of 39,000 has been the lowest since that time.  He does not have any active bleeding or spontaneous bruising at this time.  The differential diagnosis was discussed at this time.  Given his monocytosis this raises the possibility of this being a bone marrow disorder including myelodysplastic syndrome or possibly CMML.  Autoimmune thrombocytopenia could also be a possibility given the chronicity and the fluctuation of his platelet count.  From a management standpoint, I would like to make the diagnosis first before attempting any treatments.  Treatment options would include platelet transfusion, prednisone, IVIG or potentially platelet stimulating agents depending on the final diagnosis.  If we are dealing with ITP, then steroids will be the first course of action.  We are dealing with myelodysplastic syndrome, it is possible to try a platelet stimulating agent especially if he develops worsening platelet count in the future.  Given his count is adequate at this time and does not require any intervention we will await the results of his work-up prior to any treatment.  2.  Monocytosis: This could be sign of myelodysplastic syndrome and less  likely leukemia.  Bone marrow biopsy would be required at this time to investigate his hematological findings.  Risks and benefits of proceeding with such a procedure was reviewed today.  These would include bleeding, infection and thrombosis.  He is agreeable to proceed and will arrange for that in the near future and a follow-up after that.  3.  Follow-up: will be in the immediate future after his bone marrow biopsy.   25  minutes was spent with the patient face-to-face today.  More than 50% of time was spent on reviewing his laboratory data, discussing differential diagnosis, diagnostic studies as well as management options.   Zola Button, MD 11/11/202012:35 PM

## 2019-07-15 ENCOUNTER — Telehealth: Payer: Self-pay | Admitting: Oncology

## 2019-07-15 NOTE — Telephone Encounter (Signed)
Scheduled appt per 11/11 los.  Changed appt to 12/7 @ 10 per MD due to biopsy appt being scheduled on 11/30.  Spoke with pt and he is aware of his appt date and time.

## 2019-07-20 MED ORDER — CLONAZEPAM 1 MG PO TABS: 0.5000 mg | ORAL_TABLET | Freq: Two times a day (BID) | ORAL | 0 refills | Status: DC | PRN

## 2019-07-27 ENCOUNTER — Encounter: Payer: Self-pay | Admitting: Oncology

## 2019-07-27 ENCOUNTER — Ambulatory Visit: Payer: Medicare Other | Admitting: Oncology

## 2019-07-28 ENCOUNTER — Other Ambulatory Visit: Payer: Self-pay | Admitting: Radiology

## 2019-08-02 ENCOUNTER — Telehealth: Payer: Self-pay | Admitting: Oncology

## 2019-08-02 ENCOUNTER — Encounter (HOSPITAL_COMMUNITY): Payer: Self-pay

## 2019-08-02 ENCOUNTER — Ambulatory Visit (HOSPITAL_COMMUNITY)
Admission: RE | Admit: 2019-08-02 | Discharge: 2019-08-02 | Disposition: A | Discharge status: Home / Self Care | Payer: Medicare Other | Source: Ambulatory Visit | Attending: Oncology | Admitting: Oncology

## 2019-08-02 ENCOUNTER — Other Ambulatory Visit: Payer: Self-pay

## 2019-08-02 DIAGNOSIS — F4322 Adjustment disorder with anxiety: Secondary | ICD-10-CM | POA: Diagnosis not present

## 2019-08-02 DIAGNOSIS — E785 Hyperlipidemia, unspecified: Secondary | ICD-10-CM | POA: Diagnosis not present

## 2019-08-02 DIAGNOSIS — I252 Old myocardial infarction: Secondary | ICD-10-CM | POA: Insufficient documentation

## 2019-08-02 DIAGNOSIS — Z79899 Other long term (current) drug therapy: Secondary | ICD-10-CM | POA: Insufficient documentation

## 2019-08-02 DIAGNOSIS — Z7982 Long term (current) use of aspirin: Secondary | ICD-10-CM | POA: Diagnosis not present

## 2019-08-02 DIAGNOSIS — Z800 Family history of malignant neoplasm of digestive organs: Secondary | ICD-10-CM | POA: Diagnosis not present

## 2019-08-02 DIAGNOSIS — D469 Myelodysplastic syndrome, unspecified: Secondary | ICD-10-CM | POA: Insufficient documentation

## 2019-08-02 DIAGNOSIS — F1021 Alcohol dependence, in remission: Secondary | ICD-10-CM | POA: Diagnosis not present

## 2019-08-02 DIAGNOSIS — D696 Thrombocytopenia, unspecified: Secondary | ICD-10-CM | POA: Insufficient documentation

## 2019-08-02 LAB — BASIC METABOLIC PANEL
Anion gap: 8 (ref 5–15)
BUN: 12 mg/dL (ref 8–23)
CO2: 25 mmol/L (ref 22–32)
Calcium: 9.3 mg/dL (ref 8.9–10.3)
Chloride: 106 mmol/L (ref 98–111)
Creatinine, Ser: 1.05 mg/dL (ref 0.61–1.24)
GFR calc Af Amer: >60 mL/min (ref >60)
GFR calc non Af Amer: >60 mL/min (ref >60)
Glucose, Bld: 89 mg/dL (ref 70–99)
Potassium: 3.4 mmol/L — ABNORMAL LOW (ref 3.5–5.1)
Sodium: 139 mmol/L (ref 135–145)

## 2019-08-02 LAB — CBC WITH DIFFERENTIAL/PLATELET
Abs Immature Granulocytes: 0.72 10*3/uL — ABNORMAL HIGH (ref 0.00–0.07)
Basophils Absolute: 0.1 10*3/uL (ref 0.0–0.1)
Basophils Relative: 1 %
Eosinophils Absolute: 0 10*3/uL (ref 0.0–0.5)
Eosinophils Relative: 0 %
HCT: 47.9 % (ref 39.0–52.0)
Hemoglobin: 15.3 g/dL (ref 13.0–17.0)
Immature Granulocytes: 5 %
Lymphocytes Relative: 29 %
Lymphs Abs: 4.1 10*3/uL — ABNORMAL HIGH (ref 0.7–4.0)
MCH: 29.6 pg (ref 26.0–34.0)
MCHC: 31.9 g/dL (ref 30.0–36.0)
MCV: 92.6 fL (ref 80.0–100.0)
Monocytes Absolute: 5.9 10*3/uL — ABNORMAL HIGH (ref 0.1–1.0)
Monocytes Relative: 41 %
Neutro Abs: 3.4 10*3/uL (ref 1.7–7.7)
Neutrophils Relative %: 24 %
Platelets: 47 10*3/uL — ABNORMAL LOW (ref 150–400)
RBC: 5.17 MIL/uL (ref 4.22–5.81)
RDW: 14.6 % (ref 11.5–15.5)
WBC: 14.3 10*3/uL — ABNORMAL HIGH (ref 4.0–10.5)
nRBC: 0 % (ref 0.0–0.2)

## 2019-08-02 LAB — PROTIME-INR
INR: 1.2 (ref 0.8–1.2)
Prothrombin Time: 14.9 seconds (ref 11.4–15.2)

## 2019-08-02 MED ORDER — MIDAZOLAM HCL 2 MG/2ML IJ SOLN
INTRAMUSCULAR | Status: AC
  Filled 2019-08-02: qty 4

## 2019-08-02 MED ORDER — LIDOCAINE-EPINEPHRINE 1 %-1:100000 IJ SOLN
INTRAMUSCULAR | Status: AC | PRN
  Administered 2019-08-02: 10 mL

## 2019-08-02 MED ORDER — FENTANYL CITRATE (PF) 100 MCG/2ML IJ SOLN
INTRAMUSCULAR | Status: AC
  Filled 2019-08-02: qty 2

## 2019-08-02 MED ORDER — SODIUM CHLORIDE 0.9 % IV SOLN
INTRAVENOUS | Status: DC
  Administered 2019-08-02: 10:00:00 via INTRAVENOUS

## 2019-08-02 MED ORDER — FENTANYL CITRATE (PF) 100 MCG/2ML IJ SOLN
INTRAMUSCULAR | Status: AC | PRN
  Administered 2019-08-02: 50 ug via INTRAVENOUS

## 2019-08-02 MED ORDER — NALOXONE HCL 0.4 MG/ML IJ SOLN
INTRAMUSCULAR | Status: AC
  Filled 2019-08-02: qty 1

## 2019-08-02 MED ORDER — FLUMAZENIL 0.5 MG/5ML IV SOLN
INTRAVENOUS | Status: AC
  Filled 2019-08-02: qty 5

## 2019-08-02 NOTE — Procedures (Signed)
Pre-procedure Diagnosis: Thrombocytopenia and leukocytosis of uncertain etiology Post-procedure Diagnosis: Same  Technically successful CT guided bone marrow aspiration and biopsy of left iliac crest.   Complications: None Immediate  EBL: None  Signed: Sandi Mariscal Pager: [phone] 08/02/2019, 11:10 AM

## 2019-08-02 NOTE — Telephone Encounter (Signed)
Called patient regarding providers request, left a voicemail.

## 2019-08-02 NOTE — Telephone Encounter (Signed)
Patient returned phone call regarding voicemail that was left, patient agreed to do virtual visit.

## 2019-08-02 NOTE — H&P (Signed)
Chief Complaint:thrombocytopenia and monocytosis . Team is request a bone marrow biopsy for further determination of myelodysplastic syndrome or reactive or autoimmune disorde   Referring Physician(s): Dr. Wyatt Portela  Supervising Physician: Sandi Mariscal  Patient Status: [facility] - Out-pt  History of Present Illness: Jon Carter is a 68 y.o. adult history of thrombocytopenia and monocytosis . Team is request a bone marrow biopsy for further determination of myelodysplastic syndrome or reactive or autoimmune disorder  Past Medical History:  Diagnosis Date  . Adjustment disorder with anxiety 06/13/2009   Qualifier: Diagnosis of  By: Regis Bill MD, Standley Brooking  related to his sudden MI underwent counseling takes low-dose medication as needed had side effects from an SSRI   . Alcohol addiction ([place])   . Hyperlipidemia    Hx of low HDL zero CAC in 2000 at [place]  . Low testosterone   . STEMI (ST elevation myocardial infarction) ([place]) 04/26/2009   left circumflex DES     Past Surgical History:  Procedure Laterality Date  . CARDIAC CATHETERIZATION  05/02/2009   UA - patent Cfx stent, 50-60% LAD, 50-60% RCA with normal LV funtion (Dr. Adora Fridge)  . CATARACT EXTRACTION Left 2009   [facility]  . CORONARY ANGIOPLASTY WITH STENT PLACEMENT  04/26/2009   ACS/STEMI - total occlusion of mid Cfx - Xience DES 2.75x60m (Dr. TCorky Downs  . NM MYOCAR PERF WALL MOTION  04/2012   bruce myoview; mild perfusion defect in basal inferior region (infarct/scar or overlying attenuation); post-stress EF 53%, EKG negative for ischemia, hypertensive response to exercise; abnormal study but unchanged from previous study  . TRANSTHORACIC ECHOCARDIOGRAM  05/2009   EF=50-55%; trace MR; mild TR, normal RSVP; AV mildly sclerotic    Allergies: Patient has no known allergies.  Medications: Prior to Admission medications   Medication Sig Start Date End Date Taking? Authorizing Provider  aspirin 81 MG tablet Take 81 mg  by mouth daily.     Yes [provider]  clonazePAM (KLONOPIN) 1 MG tablet Take 0.5-1 tablets (0.5-1 mg total) by mouth 2 (two) times daily as needed for anxiety. 07/20/19  Yes Panosh, WStandley Brooking MD  Coenzyme Q10 150 MG CAPS Take 150 mg by mouth daily.    Yes [provider]  rosuvastatin (CRESTOR) 20 MG tablet Take 1 tablet (20 mg total) by mouth daily. 03/01/19  Yes KTroy Sine MD  Colchicine 0.6 MG CAPS Take 1 capsule by mouth 2 (two) times daily. 10/29/18   HIsaac Bliss ERayford Halsted MD  nitroGLYCERIN (NITROSTAT) 0.4 MG SL tablet Place 1 tablet (0.4 mg total) under the tongue every 5 (five) minutes as needed. 10/20/17   KTroy Sine MD  valACYclovir (VALTREX) 1000 MG tablet Take 2 tablets (2,000 mg total) by mouth 2 (two) times daily. As needed for cold sores. 10/21/18   Panosh, WStandley Brooking MD     Family History  Problem Relation Age of Onset  . Tongue cancer Father        tobacco  . Throat cancer Father   . Cirrhosis Father        also heart problems from etoh   . Hypertension Mother   . Heart Problems Other        mom  in 723s    Social History   Socioeconomic History  . Marital status: Married    Spouse name: Not on file  . Number of children: Not on file  . Years of education: Not on file  .  Highest education level: Not on file  Occupational History  . Occupation: phd Copywriter, advertising: [employer]  . Financial resource strain: Not on file  . Food insecurity    Worry: Not on file    Inability: Not on file  . Transportation needs    Medical: Not on file    Non-medical: Not on file  Tobacco Use  . Smoking status: Never Smoker  . Smokeless tobacco: Never Used  Substance and Sexual Activity  . Alcohol use: Yes    Alcohol/week: 2.0 standard drinks    Types: 2 Standard drinks or equivalent per week  . Drug use: No  . Sexual activity: Not on file  Lifestyle  . Physical activity    Days per week: Not on file    Minutes  per session: Not on file  . Stress: Not on file  Relationships  . Social Herbalist on phone: Not on file    Gets together: Not on file    Attends religious service: Not on file    Active member of club or organization: Not on file    Attends meetings of clubs or organizations: Not on file    Relationship status: Not on file  Other Topics Concern  . Not on file  Social History Narrative   Designated Party Release signed on 02/05/2010     [phone]    married works at [employer] retired Therapist, art reserve PhD Surveyor, quantity   never smoked regular exercise household  3    no pets   2-3 beverages per day wine      Retired may 18            Review of Systems: A 12 point ROS discussed and pertinent positives are indicated in the HPI above.  All other systems are negative.  Review of Systems  Constitutional: Negative for fever.  Respiratory: Negative for cough and shortness of breath.   Cardiovascular: Negative for chest pain.  Gastrointestinal: Negative for abdominal pain.  Psychiatric/Behavioral: Negative for behavioral problems and confusion.    Vital Signs: BP (!) 158/84   Pulse 85   Temp 98.2 F (36.8 C) (Oral)   Resp 18   SpO2 99%   Physical Exam Vitals signs and nursing note reviewed.  Constitutional:      Appearance: He is well-developed.  HENT:     Head: Normocephalic.  Neck:     Musculoskeletal: Normal range of motion.  Pulmonary:     Effort: Pulmonary effort is normal.  Musculoskeletal: Normal range of motion.  Skin:    General: Skin is dry.  Neurological:     Mental Status: He is alert and oriented to person, place, and time.  Psychiatric:        Mood and Affect: Mood normal.        Behavior: Behavior normal.     Imaging: No results found.  Labs:  CBC: Recent Labs    10/21/18 1644 05/31/19 1455 07/12/19 1121 08/02/19 0945  WBC 9.1 15.4* 18.2 Repeated and verified X2.* 14.3*  HGB 15.6 14.5 14.7 15.3  HCT 48.2 43.5 44.0 47.9  PLT  56* 50.0 Repeated and verified X2.* 39.0 Repeated and verified X2.* 47*    COAGS: Recent Labs    08/02/19 0945  INR 1.2    BMP: Recent Labs    10/21/18 1129 08/02/19 0945  NA 138 139  K 4.2 3.4*  CL 102 106  CO2 28 25  GLUCOSE 87 89  BUN 16 12  CALCIUM 9.3 9.3  CREATININE 0.93 1.05  GFRNONAA  --  >60  GFRAA  --  >60    LIVER FUNCTION TESTS: Recent Labs    10/21/18 1129  BILITOT 0.8  AST 30  ALT 36  ALKPHOS 38*  PROT 8.4*  ALBUMIN 4.4    TUMOR MARKERS: No results for input(s): AFPTM, CEA, CA199, CHROMGRNA in the last 8760 hours.  Assessment and Plan: 25 y,o. male history of thrombocytopenia and monocytosis . Team is request a bone marrow biopsy for further determination of myelodysplastic syndrome or reactive or autoimmune disorder. Labs from 11.30.20 PLT 47, Monocytes 5.9,  WBC is 14.3   Risks and benefits of bone marrow biopsy was discussed with the patient including, but not limited to bleeding, infection, damage to adjacent structures or low yield requiring additional tests.  All of the questions were answered and there is agreement to proceed.  Consent signed and in chart.   Thank you for this interesting consult.  I greatly enjoyed meeting Jon Carter and look forward to participating in their care.  A copy of this report was sent to the requesting provider on this date.  Electronically Signed: Avel Peace, NP 08/02/2019, 10:42 AM   I spent a total of  30 Minutes   in face to face in clinical consultation, greater than 50% of which was counseling/coordinating care for bone marrow biopsy

## 2019-08-02 NOTE — Discharge Instructions (Addendum)
Please call Radiology clinic [phone] or after hours and ask for Interventional Radiologist on call with any questions or concerns about your biopsy site.  You may remove your dressing and shower tomorrow.  Bone Marrow Aspiration and Bone Marrow Biopsy, Adult, Care After This sheet gives you information about how to care for yourself after your procedure. Your health care provider may also give you more specific instructions. If you have problems or questions, contact your health care provider. What can I expect after the procedure? After the procedure, it is common to have:  Mild pain and tenderness.  Swelling.  Bruising. Follow these instructions at home: Puncture site care      Follow instructions from your health care provider about how to take care of the puncture site. Make sure you: ? Wash your hands with soap and water before you change your bandage (dressing). If soap and water are not available, use hand sanitizer. ? Change your dressing as told by your health care provider.  Check your puncture siteevery day for signs of infection. Check for: ? More redness, swelling, or pain. ? More fluid or blood. ? Warmth. ? Pus or a bad smell. General instructions  Take over-the-counter and prescription medicines only as told by your health care provider.  Do not take baths, swim, or use a hot tub until your health care provider approves. Ask if you can take a shower or have a sponge bath.  Return to your normal activities as told by your health care provider. Ask your health care provider what activities are safe for you.  Do not drive for 24 hours if you were given a medicine to help you relax (sedative) during your procedure.  Keep all follow-up visits as told by your health care provider. This is important. Contact a health care provider if:  Your pain is not controlled with medicine. Get help right away if:  You have a fever.  You have more redness, swelling, or  pain around the puncture site.  You have more fluid or blood coming from the puncture site.  Your puncture site feels warm to the touch.  You have pus or a bad smell coming from the puncture site. These symptoms may represent a serious problem that is an emergency. Do not wait to see if the symptoms will go away. Get medical help right away. Call your local emergency services (911 in the [place] Do not drive yourself to the hospital. Summary  After the procedure, it is common to have mild pain, tenderness, swelling, and bruising.  Follow instructions from your health care provider about how to take care of the puncture site.  Get help right away if you have any symptoms of infection or if you have more blood or fluid coming from the puncture site. This information is not intended to replace advice given to you by your health care provider. Make sure you discuss any questions you have with your health care provider. Document Released: 03/08/2005 Document Revised: 12/02/2017 Document Reviewed: 01/31/2016 Elsevier Patient Education  2020 [employer].

## 2019-08-03 ENCOUNTER — Other Ambulatory Visit: Payer: Self-pay

## 2019-08-04 LAB — SURGICAL PATHOLOGY

## 2019-08-06 ENCOUNTER — Telehealth: Payer: Self-pay | Admitting: Oncology

## 2019-08-06 NOTE — Telephone Encounter (Signed)
Patient called to convert 12/07 appointment to a virtual visit.

## 2019-08-09 ENCOUNTER — Inpatient Hospital Stay: Payer: Medicare Other | Attending: Oncology | Admitting: Oncology

## 2019-08-09 ENCOUNTER — Encounter (HOSPITAL_COMMUNITY): Payer: Self-pay | Admitting: Oncology

## 2019-08-09 DIAGNOSIS — D696 Thrombocytopenia, unspecified: Secondary | ICD-10-CM

## 2019-08-09 NOTE — Progress Notes (Signed)
Hematology and Oncology Follow Up for Telemedicine Visits  Jon Carter [id number] 1950/10/04 68 y.o. 08/09/2019 10:21 AM Panosh, Standley Brooking, MDPanosh, Standley Brooking, MD   I connected with Mr. Rubinstein on 08/09/19 at 10:00 AM EST by video enabled telemedicine visit and verified that I am speaking with the correct person using two identifiers.   I discussed the limitations, risks, security and privacy concerns of performing an evaluation and management service by telemedicine and the availability of in-person appointments. I also discussed with the patient that there may be a patient responsible charge related to this service. The patient expressed understanding and agreed to proceed.  Other persons participating in the visit and their role in the encounter: Wife Rosemarie Ax  Patient's location: Home Provider's location: Office    Principle Diagnosis: 68 year old with monocytosis and thrombocytopenia diagnosed in 2013.  His work-up revealed CMML documented bone marrow biopsy on August 02, 2019.   Prior Therapy: Bone marrow biopsy obtained on August 02, 2019.  Current therapy:   Interim History: Mr. Sales reports no complaints at this time.  He denies any recent hospitalization or illnesses.  He denies any excessive fatigue, tiredness or bruising.  He denies any recent hospitalizations or illnesses.  He continues to perform activities of daily living without any decline in ability to do so.  He tolerated bone marrow biopsy without any issues.   Medications: I have reviewed the patient's current medications.  Current Outpatient Medications  Medication Sig Dispense Refill  . aspirin 81 MG tablet Take 81 mg by mouth daily.      . clonazePAM (KLONOPIN) 1 MG tablet Take 0.5-1 tablets (0.5-1 mg total) by mouth 2 (two) times daily as needed for anxiety. 20 tablet 0  . Coenzyme Q10 150 MG CAPS Take 150 mg by mouth daily.     . Colchicine 0.6 MG CAPS Take 1 capsule by mouth 2 (two) times daily. 45  capsule 0  . nitroGLYCERIN (NITROSTAT) 0.4 MG SL tablet Place 1 tablet (0.4 mg total) under the tongue every 5 (five) minutes as needed. 5 tablet 6  . rosuvastatin (CRESTOR) 20 MG tablet Take 1 tablet (20 mg total) by mouth daily. 90 tablet 0  . valACYclovir (VALTREX) 1000 MG tablet Take 2 tablets (2,000 mg total) by mouth 2 (two) times daily. As needed for cold sores. 30 tablet 2   No current facility-administered medications for this visit.      Allergies: No Known Allergies  Past Medical History, Surgical history, Social history, and Family History were reviewed and updated.      Lab Results: Lab Results  Component Value Date   WBC 14.3 (H) 08/02/2019   HGB 15.3 08/02/2019   HCT 47.9 08/02/2019   MCV 92.6 08/02/2019   PLT 47 (L) 08/02/2019     Chemistry      Component Value Date/Time   NA 139 08/02/2019 0945   K 3.4 (L) 08/02/2019 0945   CL 106 08/02/2019 0945   CO2 25 08/02/2019 0945   BUN 12 08/02/2019 0945   CREATININE 1.05 08/02/2019 0945   CREATININE 0.85 03/31/2015 1333      Component Value Date/Time   CALCIUM 9.3 08/02/2019 0945   ALKPHOS 38 (L) 10/21/2018 1129   AST 30 10/21/2018 1129   ALT 36 10/21/2018 1129   BILITOT 0.8 10/21/2018 1129      Impression and Plan:  Impression and Plan:   69 year old with:  1.  Thrombocytopenia and monocytosis noted back in 2013.  He is  work-up completed on August 02, 2019 and had a bone marrow biopsy which confirmed the presence of myelodysplastic syndrome.    The results of the bone marrow biopsy was discussed today with the patient and his wife via virtual visit.  The implication of therapy diagnosis of CMML was reviewed.  Treatment options were also discussed.  These options would include therapies to address his cytopenias such as growth factor support and transfusion in addition to hypomethylating agents among others.  He remains asymptomatic at this time and would like to defer treatment as long as  possible.  I have also recommended evaluation at a tertiary care center for second opinion regarding this particular condition.  The risk of transitioning into leukemia was also reviewed he does have slightly increased blast count with cytogenetics that is currently pending.  To fully assess his risk of transforming into leukemia will await these results.  2.  Thrombocytopenia: Platelet count is adequate at this time without any bleeding.  Does not require any transfusion although clear instruction was given to him for any bleeding symptoms to occur.  3.  Follow-up: Will be in the next 4 weeks for repeat laboratory testing.   The patient was advised to call back or seek an in-person evaluation if the symptoms worsen or if the condition fails to improve as anticipated.  I provided 25 minutes of face-to-face video visit time during this encounter, and > 50% was dedicated to updating his disease status, reviewing pathology reports, treatment options and management options for the future.  Zola Button, MD 08/09/2019 10:21 AM

## 2019-08-10 ENCOUNTER — Telehealth: Payer: Self-pay | Admitting: Oncology

## 2019-08-10 NOTE — Telephone Encounter (Signed)
Scheduled appt per 12/7 los. Left a vm of the appt date and time.

## 2019-08-11 ENCOUNTER — Encounter (HOSPITAL_COMMUNITY): Payer: Self-pay | Admitting: Oncology

## 2019-08-12 ENCOUNTER — Telehealth: Payer: Self-pay | Admitting: Oncology

## 2019-08-12 ENCOUNTER — Encounter: Payer: Self-pay | Admitting: Oncology

## 2019-08-12 NOTE — Telephone Encounter (Signed)
Returned patient's phone call regarding rescheduling 01/18 appointment, per patient's request appointment has been moved to 01/21.

## 2019-08-13 ENCOUNTER — Telehealth: Payer: Self-pay

## 2019-08-13 NOTE — Telephone Encounter (Signed)
See message below. Patient informed via MyChart Dr. Alen Blew will be contacting him.

## 2019-08-13 NOTE — Telephone Encounter (Signed)
-----   Message from Wyatt Portela, MD sent at 08/13/2019  9:25 AM EST ----- Regarding: RE: Patient request I will address with him. Thanks ----- Message ----- From: Teodoro Spray, RN Sent: 08/12/2019   4:15 PM EST To: Wyatt Portela, MD Subject: Patient request                                Patient contacted office requesting "clarification of my treatment options". I read to him your note from his visit on 08/09/19, but patient wanted more information on if treatment would be required at this moment rather than just surveillance. Patient requested to speak with you to talk about treatment options. His next scheduled visit is 09/23/19.   Please advise. Thank you.

## 2019-08-20 ENCOUNTER — Other Ambulatory Visit: Payer: Self-pay

## 2019-08-20 MED ORDER — CLONAZEPAM 1 MG PO TABS: 0.5000 mg | ORAL_TABLET | Freq: Two times a day (BID) | ORAL | 0 refills | Status: AC | PRN

## 2019-08-20 NOTE — Telephone Encounter (Signed)
Last ov:04/06/2019 Last filled:07/20/2019 Please advise in absence of Dr.Panosh

## 2019-08-30 ENCOUNTER — Other Ambulatory Visit: Payer: Self-pay

## 2019-08-30 MED ORDER — ROSUVASTATIN CALCIUM 20 MG PO TABS: 20.0000 mg | ORAL_TABLET | Freq: Every day | ORAL | 0 refills | Status: AC

## 2019-09-09 DIAGNOSIS — Z20822 Contact with and (suspected) exposure to covid-19: Secondary | ICD-10-CM | POA: Diagnosis not present

## 2019-09-20 ENCOUNTER — Other Ambulatory Visit: Payer: Medicare Other

## 2019-09-20 ENCOUNTER — Ambulatory Visit: Payer: Medicare Other | Admitting: Oncology

## 2019-09-22 ENCOUNTER — Encounter: Payer: Self-pay | Admitting: Oncology

## 2019-09-23 ENCOUNTER — Other Ambulatory Visit: Payer: Self-pay

## 2019-09-23 ENCOUNTER — Inpatient Hospital Stay: Payer: Medicare PPO | Attending: Oncology

## 2019-09-23 ENCOUNTER — Inpatient Hospital Stay (HOSPITAL_BASED_OUTPATIENT_CLINIC_OR_DEPARTMENT_OTHER): Payer: Medicare PPO | Admitting: Oncology

## 2019-09-23 VITALS — BP 133/68 | HR 85 | Temp 97.0°F | Resp 18 | Ht 71.0 in | Wt 216.1 lb

## 2019-09-23 DIAGNOSIS — D6959 Other secondary thrombocytopenia: Secondary | ICD-10-CM | POA: Diagnosis not present

## 2019-09-23 DIAGNOSIS — C9310 Chronic myelomonocytic leukemia not having achieved remission: Secondary | ICD-10-CM | POA: Insufficient documentation

## 2019-09-23 DIAGNOSIS — D696 Thrombocytopenia, unspecified: Secondary | ICD-10-CM

## 2019-09-23 LAB — CMP (CANCER CENTER ONLY)
ALT: 25 U/L (ref 0–44)
AST: 24 U/L (ref 15–41)
Albumin: 4.3 g/dL (ref 3.5–5.0)
Alkaline Phosphatase: 51 U/L (ref 38–126)
Anion gap: 10 (ref 5–15)
BUN: 14 mg/dL (ref 8–23)
CO2: 26 mmol/L (ref 22–32)
Calcium: 9.4 mg/dL (ref 8.9–10.3)
Chloride: 104 mmol/L (ref 98–111)
Creatinine: 1.17 mg/dL (ref 0.61–1.24)
GFR, Est AFR Am: >60 mL/min (ref >60)
GFR, Estimated: >60 mL/min (ref >60)
Glucose, Bld: 123 mg/dL — ABNORMAL HIGH (ref 70–99)
Potassium: 4 mmol/L (ref 3.5–5.1)
Sodium: 140 mmol/L (ref 135–145)
Total Bilirubin: 0.6 mg/dL (ref 0.3–1.2)
Total Protein: 9 g/dL — ABNORMAL HIGH (ref 6.5–8.1)

## 2019-09-23 LAB — CBC WITH DIFFERENTIAL (CANCER CENTER ONLY)
Abs Immature Granulocytes: 0.55 10*3/uL — ABNORMAL HIGH (ref 0.00–0.07)
Basophils Absolute: 0.1 10*3/uL (ref 0.0–0.1)
Basophils Relative: 0 %
Eosinophils Absolute: 0.1 10*3/uL (ref 0.0–0.5)
Eosinophils Relative: 1 %
HCT: 48.6 % (ref 39.0–52.0)
Hemoglobin: 15.7 g/dL (ref 13.0–17.0)
Immature Granulocytes: 4 %
Lymphocytes Relative: 15 %
Lymphs Abs: 2.1 10*3/uL (ref 0.7–4.0)
MCH: 29 pg (ref 26.0–34.0)
MCHC: 32.3 g/dL (ref 30.0–36.0)
MCV: 89.8 fL (ref 80.0–100.0)
Monocytes Absolute: 6.8 10*3/uL — ABNORMAL HIGH (ref 0.1–1.0)
Monocytes Relative: 47 %
Neutro Abs: 4.8 10*3/uL (ref 1.7–7.7)
Neutrophils Relative %: 33 %
Platelet Count: 62 10*3/uL — ABNORMAL LOW (ref 150–400)
RBC: 5.41 MIL/uL (ref 4.22–5.81)
RDW: 14.2 % (ref 11.5–15.5)
WBC Count: 14.3 10*3/uL — ABNORMAL HIGH (ref 4.0–10.5)
nRBC: 0 % (ref 0.0–0.2)

## 2019-09-23 NOTE — Progress Notes (Signed)
Hematology and Oncology Follow Up Visit  BELDON NOWLING [id number] 01-18-1951 69 y.o. 09/23/2019 3:32 PM Panosh, Standley Brooking, MDPanosh, Standley Brooking, MD   Principle Diagnosis: 69 year old man with MDS diagnosed in December 2020.  He presented with thrombocytopenia and monocytosis.  Bone marrow biopsy obtained on 08/03/2019 showed MDS/MPD (CMML 2) with normal cytogenetics.  Current therapy: Active surveillance.  Interim History: Mr. Rosier is here for repeat evaluation.  Since the last visit, he reports no major changes in his health.  He continues to perform activities of daily living without any decline in ability to do so.  He denies any hospitalization or illnesses.  He denies any bruising or bleeding issues.  Performance status and quality of life remained excellent.   Medications: I have reviewed the patient's current medications.  Current Outpatient Medications  Medication Sig Dispense Refill  . aspirin 81 MG tablet Take 81 mg by mouth daily.      . clonazePAM (KLONOPIN) 1 MG tablet Take 0.5-1 tablets (0.5-1 mg total) by mouth 2 (two) times daily as needed for anxiety. 60 tablet 0  . Coenzyme Q10 150 MG CAPS Take 150 mg by mouth daily.     . Colchicine 0.6 MG CAPS Take 1 capsule by mouth 2 (two) times daily. 45 capsule 0  . nitroGLYCERIN (NITROSTAT) 0.4 MG SL tablet Place 1 tablet (0.4 mg total) under the tongue every 5 (five) minutes as needed. 5 tablet 6  . rosuvastatin (CRESTOR) 20 MG tablet Take 1 tablet (20 mg total) by mouth daily. 90 tablet 0  . valACYclovir (VALTREX) 1000 MG tablet Take 2 tablets (2,000 mg total) by mouth 2 (two) times daily. As needed for cold sores. 30 tablet 2   No current facility-administered medications for this visit.     Allergies: No Known Allergies     Physical Exam: Blood pressure 133/68, pulse 85, temperature (!) 97 F (36.1 C), temperature source Temporal, resp. rate 18, height '5\' 11"'$  (1.803 m), weight 216 lb 1.6 oz (98 kg), SpO2 98 %. ECOG:  0   General appearance: Comfortable appearing without any discomfort Head: Normocephalic without any trauma Oropharynx: Mucous membranes are moist and pink without any thrush or ulcers. Eyes: Pupils are equal and round reactive to light. Lymph nodes: No cervical, supraclavicular, inguinal or axillary lymphadenopathy.   Heart:regular rate and rhythm.  S1 and S2 without leg edema. Lung: Clear without any rhonchi or wheezes.  No dullness to percussion. Abdomin: Soft, nontender, nondistended with good bowel sounds.  No hepatosplenomegaly. Musculoskeletal: No joint deformity or effusion.  Full range of motion noted. Neurological: No deficits noted on motor, sensory and deep tendon reflex exam. Skin: No petechial rash or dryness.  No ecchymosis or bruising.    Lab Results: Lab Results  Component Value Date   WBC 14.3 (H) 08/02/2019   HGB 15.3 08/02/2019   HCT 47.9 08/02/2019   MCV 92.6 08/02/2019   PLT 47 (L) 08/02/2019     Chemistry      Component Value Date/Time   NA 139 08/02/2019 0945   K 3.4 (L) 08/02/2019 0945   CL 106 08/02/2019 0945   CO2 25 08/02/2019 0945   BUN 12 08/02/2019 0945   CREATININE 1.05 08/02/2019 0945   CREATININE 0.85 03/31/2015 1333      Component Value Date/Time   CALCIUM 9.3 08/02/2019 0945   ALKPHOS 38 (L) 10/21/2018 1129   AST 30 10/21/2018 1129   ALT 36 10/21/2018 1129   BILITOT 0.8 10/21/2018 1129  Impression and Plan:   69 year old with:  1.  Myelodysplastic syndrome/myeloproliferative disorder diagnosed on a bone marrow biopsy obtained on 08/02/2019.  Biopsy showed CMML 2 with normal cytogenetics and FISH panel.  The natural course of this disease and treatment options were reviewed today in detail.  Supportive management, growth factor support, systemic therapy as well as bone marrow transplant were all reviewed at this time.  He is asymptomatic at this time with overall stable counts and normal hemoglobin.  I see no urgent need  for intervention at this time and I recommended active surveillance given the chronicity of these findings.  2.  Thrombocytopenia: Related to his bone marrow disease and no active bleeding noted at this time.  His platelet count at 62,000 which should be adequate to prevent him from spontaneous bleeding as well as postoperative bleeding per the majority of minor procedures.  The role for platelet stimulating agents as well as platelet transfusion was discussed.  These would be used if his platelet count drops below 30,000 or active bleeding is noted.  These could also be used in preparation for potential surgical procedure.  3.  Follow-up: He will return in 3 months for repeat follow-up.   30 minutes was spent on this encounter.  The time was dedicated to reviewing laboratory data, bone marrow biopsy results, treatment options for the future and answering questions regarding future plan of care.   Zola Button, MD 1/21/20213:32 PM

## 2019-09-24 ENCOUNTER — Telehealth: Payer: Self-pay | Admitting: Oncology

## 2019-09-24 NOTE — Telephone Encounter (Signed)
Scheduled appt per 1/21 los.  Was not able to reach pt.  Sent a message to HIM pool to get a calendar mailed out.

## 2019-10-03 ENCOUNTER — Ambulatory Visit: Payer: Medicare Other

## 2019-10-08 ENCOUNTER — Ambulatory Visit: Payer: Medicare PPO | Attending: Internal Medicine

## 2019-10-08 DIAGNOSIS — Z23 Encounter for immunization: Secondary | ICD-10-CM

## 2019-10-08 NOTE — Progress Notes (Signed)
   Covid-19 Vaccination Clinic  Name:  Jon Carter    MRN: [id number] DOB: 12-15-1950  10/08/2019  [name] was observed post Covid-19 immunization for 15 minutes without incidence. He was provided with Vaccine Information Sheet and instruction to access the V-Safe system.   [name] was instructed to call 911 with any severe reactions post vaccine: Marland Kitchen Difficulty breathing  . Swelling of your face and throat  . A fast heartbeat  . A bad rash all over your body  . Dizziness and weakness    Immunizations Administered    Name Date Dose VIS Date Route   Pfizer COVID-19 Vaccine 10/08/2019 11:50 AM 0.3 mL 08/13/2019 Intramuscular   Manufacturer: [place]   Lot: [id number]   [place]: [id number]

## 2019-10-10 ENCOUNTER — Ambulatory Visit: Payer: Medicare PPO

## 2019-10-17 ENCOUNTER — Ambulatory Visit: Payer: Medicare Other

## 2019-11-03 ENCOUNTER — Other Ambulatory Visit: Payer: Self-pay | Admitting: Internal Medicine

## 2019-11-03 ENCOUNTER — Ambulatory Visit: Payer: Medicare PPO | Attending: Internal Medicine

## 2019-11-03 DIAGNOSIS — Z23 Encounter for immunization: Secondary | ICD-10-CM | POA: Insufficient documentation

## 2019-11-03 NOTE — Progress Notes (Signed)
   Covid-19 Vaccination Clinic  Name:  BRAWLEY ODONALD    MRN: [id number] DOB: 1951-06-05  11/03/2019  Mr. Tonner was observed post Covid-19 immunization for 15 minutes without incident. He was provided with Vaccine Information Sheet and instruction to access the V-Safe system.   Mr. Reisenauer was instructed to call 911 with any severe reactions post vaccine: Marland Kitchen Difficulty breathing  . Swelling of face and throat  . A fast heartbeat  . A bad rash all over body  . Dizziness and weakness   Immunizations Administered    Name Date Dose VIS Date Route   Pfizer COVID-19 Vaccine 11/03/2019 11:47 AM 0.3 mL 08/13/2019 Intramuscular   Manufacturer: [place]   Lot: [id number]   [place]: [id number]

## 2019-11-12 NOTE — Progress Notes (Signed)
Chief Complaint  Patient presents with  . Leg Pain    left distal thigh    HPI: [name] 69 y.o. come in for problems of concern  Onset about 2 weeks ago of lump area inner distal left posterior thigh that was sore    Since then  Lump feeling has gone away and less problem 75%  Better than last week.  No edema falling  Bleeding   Last 2 days  had sweating  And mist.  No chills just hot and headaches  Lethargic fro 2 days.   But  Ongoing reg exercise without problem and no fever when checked temp. Better today  Is a bit nervous about the low platelet conditions  No bleeding x  Hit hand and  Has bruise there  No petechia  Has had   covid vaccine last one ? Over 3 weeks ago  ROS: See pertinent positives and negatives per HPI. No cp sob falling bleeding   Is nervous as usual about situations   Asks about  DVT   Blood clots   Past Medical History:  Diagnosis Date  . Adjustment disorder with anxiety 06/13/2009   Qualifier: Diagnosis of  By: Regis Bill MD, Standley Brooking  related to his sudden MI underwent counseling takes low-dose medication as needed had side effects from an SSRI   . Alcohol addiction ([place])   . Hyperlipidemia    Hx of low HDL zero CAC in 2000 at [place]  . Low testosterone   . STEMI (ST elevation myocardial infarction) ([place]) 04/26/2009   left circumflex DES     Family History  Problem Relation Age of Onset  . Tongue cancer Father        tobacco  . Throat cancer Father   . Cirrhosis Father        also heart problems from etoh   . Hypertension Mother   . Heart Problems Other        mom  in 10s     Social History   Socioeconomic History  . Marital status: Married    Spouse name: Not on file  . Number of children: Not on file  . Years of education: Not on file  . Highest education level: Not on file  Occupational History  . Occupation: Tour manager: [employer]  Tobacco Use  . Smoking status: Never Smoker  . Smokeless tobacco: Never Used    Substance and Sexual Activity  . Alcohol use: Yes    Alcohol/week: 2.0 standard drinks    Types: 2 Standard drinks or equivalent per week  . Drug use: No  . Sexual activity: Not on file  Other Topics Concern  . Not on file  Social History Narrative   Designated Party Release signed on 02/05/2010     [id number]    married works at [employer] retired Therapist, art reserve PhD Surveyor, quantity   never smoked regular exercise household  3    no pets   2-3 beverages per day wine      Retired may 18            Social Determinants of Radio broadcast assistant Strain:   . Difficulty of Paying Living Expenses:   Food Insecurity:   . Worried About Charity fundraiser in the Last Year:   . Arboriculturist in the Last Year:   Transportation Needs:   . Film/video editor (Medical):   Marland Kitchen  Lack of Transportation (Non-Medical):   Physical Activity:   . Days of Exercise per Week:   . Minutes of Exercise per Session:   Stress:   . Feeling of Stress :   Social Connections:   . Frequency of Communication with Friends and Family:   . Frequency of Social Gatherings with Friends and Family:   . Attends Religious Services:   . Active Member of Clubs or Organizations:   . Attends Archivist Meetings:   Marland Kitchen Marital Status:     Outpatient Medications Prior to Visit  Medication Sig Dispense Refill  . aspirin 81 MG tablet Take 81 mg by mouth daily.      . clonazePAM (KLONOPIN) 1 MG tablet Take 0.5-1 tablets (0.5-1 mg total) by mouth 2 (two) times daily as needed for anxiety. 60 tablet 0  . Coenzyme Q10 150 MG CAPS Take 150 mg by mouth daily.     . Colchicine 0.6 MG CAPS Take 1 capsule by mouth 2 (two) times daily. 45 capsule 0  . rosuvastatin (CRESTOR) 20 MG tablet Take 1 tablet (20 mg total) by mouth daily. 90 tablet 0  . valACYclovir (VALTREX) 1000 MG tablet TAKE 2 TABLETS(2000 MG) BY MOUTH TWICE DAILY AS NEEDED FOR COLD SORES 30 tablet 2  . nitroGLYCERIN (NITROSTAT) 0.4 MG SL tablet Place  1 tablet (0.4 mg total) under the tongue every 5 (five) minutes as needed. 5 tablet 6   No facility-administered medications prior to visit.     EXAM:  BP 128/80   Pulse 87   Temp (!) 97 F (36.1 C) (Other (Comment))   Ht 5\' 11"  (1.803 m)   Wt 210 lb (95.3 kg)   SpO2 96%   BMI 29.29 kg/m   Body mass index is 29.29 kg/m.  GENERAL: vitals reviewed and listed above, alert, oriented, appears well hydrated and in no acute distress HEENT: atraumatic, conjunctiva  clear, no obvious abnormalities on inspection of external nose and ears OP : masked lipds chapped no bleeding vescisl NECK: no obvious masses on inspection palpation  LUNGS: clear to auscultation bilaterally, no wheezes, rales or rhonchi, good air movement CV: HRRR, no clubbing cyanosis or  peripheral edema nl cap refill  Abdomen:  Sof,t normal bowel sounds without hepatosplenomegaly, no guarding rebound or masses no CVA tenderness MS: moves all extremities without noticeable focal  Abnormality  Area of concern left leg is small area and don't feel a chord  Seems near hamstring attachment  No edema or skin changes  No petechia  PSYCH: pleasant and cooperative, no obvious depression  A bit anxious but looks well  Lab Results  Component Value Date   WBC 14.3 (H) 09/23/2019   HGB 15.7 09/23/2019   HCT 48.6 09/23/2019   PLT 62 (L) 09/23/2019   GLUCOSE 123 (H) 09/23/2019   CHOL 80 10/21/2018   TRIG 176.0 (H) 10/21/2018   HDL 23.30 (L) 10/21/2018   LDLDIRECT 128.7 01/04/2009   LDLCALC 22 10/21/2018   ALT 25 09/23/2019   AST 24 09/23/2019   NA 140 09/23/2019   K 4.0 09/23/2019   CL 104 09/23/2019   CREATININE 1.17 09/23/2019   BUN 14 09/23/2019   CO2 26 09/23/2019   TSH 4.30 10/21/2018   PSA 0.38 10/21/2018   INR 1.2 08/02/2019   BP Readings from Last 3 Encounters:  11/15/19 128/80  09/23/19 133/68  08/02/19 130/83    ASSESSMENT AND PLAN:  Discussed the following assessment and plan:  Pain of left  lower  extremity  Dermatitis of lip  Myelodysplastic syndrome (HCC) - cmml2 on bone bx, thronbocytopenia   no sx following  Medication management Number of issues   Leg is much better  Follow observe  Disc  Getting [name] of vein but think low yield based on hx and exam today  To let us know if recurring  And progressive . Lip issues  As below  To contact  teami f fever  On going sweats of feeling badly  To get next cbcdiff  In APril  I refilled the ntg as his was outdated but hasnt needed it   Outside external source  DATA REVIEWED:  Dr Alen Blew etc   Total time on date  of service including record review ordering and plan of care:  30 minutes    -Patient advised to return or notify health care team  if  new concerns arise.  Patient Instructions  Your exam is good today  If  Lump and pain come back we can get an [name] of leg but I dont think  is a DVT .  Let us know If  Any thing worse   Let us know if  Temp 100 and 100.3   Drenching sweats for  no reason Water based or vaseline no scent  On lips   But could use   anti yeast  At corners of mouth if irritated (perleche   If worse see dermatologist   Contact  Dr Earlean Shawl    About  Need for next colon.      Standley Brooking. Liany Mumpower M.D.

## 2019-11-15 ENCOUNTER — Ambulatory Visit (INDEPENDENT_AMBULATORY_CARE_PROVIDER_SITE_OTHER): Payer: Medicare PPO | Admitting: Internal Medicine

## 2019-11-15 ENCOUNTER — Other Ambulatory Visit: Payer: Self-pay

## 2019-11-15 ENCOUNTER — Encounter: Payer: Self-pay | Admitting: Internal Medicine

## 2019-11-15 VITALS — BP 128/80 | HR 87 | Temp 97.0°F | Ht 71.0 in | Wt 210.0 lb

## 2019-11-15 DIAGNOSIS — D469 Myelodysplastic syndrome, unspecified: Secondary | ICD-10-CM | POA: Diagnosis not present

## 2019-11-15 DIAGNOSIS — L309 Dermatitis, unspecified: Secondary | ICD-10-CM

## 2019-11-15 DIAGNOSIS — M79605 Pain in left leg: Secondary | ICD-10-CM

## 2019-11-15 DIAGNOSIS — Z79899 Other long term (current) drug therapy: Secondary | ICD-10-CM | POA: Diagnosis not present

## 2019-11-15 MED ORDER — NITROGLYCERIN 0.4 MG SL SUBL: 0.4000 mg | SUBLINGUAL_TABLET | SUBLINGUAL | 6 refills | Status: AC | PRN

## 2019-11-15 NOTE — Patient Instructions (Signed)
Your exam is good today  If  Lump and pain come back we can get an [name] of leg but I dont think  is a DVT .  Let us know If  Any thing worse   Let us know if  Temp 100 and 100.3   Drenching sweats for  no reason Water based or vaseline no scent  On lips   But could use   anti yeast  At corners of mouth if irritated (perleche   If worse see dermatologist   Contact  Dr Earlean Shawl    About  Need for next colon.

## 2019-11-16 DIAGNOSIS — Z20828 Contact with and (suspected) exposure to other viral communicable diseases: Secondary | ICD-10-CM | POA: Diagnosis not present

## 2019-11-19 ENCOUNTER — Encounter: Payer: Self-pay | Admitting: Oncology

## 2019-11-19 ENCOUNTER — Other Ambulatory Visit: Payer: Self-pay

## 2019-11-19 ENCOUNTER — Emergency Department (HOSPITAL_COMMUNITY)
Admission: EM | Admit: 2019-11-19 | Discharge: 2019-11-19 | Disposition: A | Discharge status: Other Acute Inpatient Hospital | Payer: Medicare PPO | Attending: Emergency Medicine | Admitting: Emergency Medicine

## 2019-11-19 ENCOUNTER — Encounter (HOSPITAL_COMMUNITY): Payer: Self-pay

## 2019-11-19 ENCOUNTER — Other Ambulatory Visit: Payer: Self-pay | Admitting: Oncology

## 2019-11-19 ENCOUNTER — Telehealth: Payer: Self-pay | Admitting: Oncology

## 2019-11-19 ENCOUNTER — Telehealth: Payer: Self-pay

## 2019-11-19 DIAGNOSIS — C9310 Chronic myelomonocytic leukemia not having achieved remission: Secondary | ICD-10-CM

## 2019-11-19 DIAGNOSIS — I2510 Atherosclerotic heart disease of native coronary artery without angina pectoris: Secondary | ICD-10-CM | POA: Insufficient documentation

## 2019-11-19 DIAGNOSIS — Z862 Personal history of diseases of the blood and blood-forming organs and certain disorders involving the immune mechanism: Secondary | ICD-10-CM | POA: Diagnosis not present

## 2019-11-19 DIAGNOSIS — I252 Old myocardial infarction: Secondary | ICD-10-CM | POA: Diagnosis not present

## 2019-11-19 DIAGNOSIS — C9210 Chronic myeloid leukemia, BCR/ABL-positive, not having achieved remission: Secondary | ICD-10-CM | POA: Insufficient documentation

## 2019-11-19 DIAGNOSIS — R61 Generalized hyperhidrosis: Secondary | ICD-10-CM | POA: Diagnosis not present

## 2019-11-19 DIAGNOSIS — R319 Hematuria, unspecified: Secondary | ICD-10-CM | POA: Diagnosis not present

## 2019-11-19 DIAGNOSIS — Z79899 Other long term (current) drug therapy: Secondary | ICD-10-CM | POA: Insufficient documentation

## 2019-11-19 DIAGNOSIS — Z955 Presence of coronary angioplasty implant and graft: Secondary | ICD-10-CM | POA: Insufficient documentation

## 2019-11-19 DIAGNOSIS — Z7982 Long term (current) use of aspirin: Secondary | ICD-10-CM | POA: Insufficient documentation

## 2019-11-19 DIAGNOSIS — R52 Pain, unspecified: Secondary | ICD-10-CM | POA: Diagnosis not present

## 2019-11-19 DIAGNOSIS — R7989 Other specified abnormal findings of blood chemistry: Secondary | ICD-10-CM | POA: Diagnosis present

## 2019-11-19 DIAGNOSIS — R509 Fever, unspecified: Secondary | ICD-10-CM | POA: Diagnosis not present

## 2019-11-19 DIAGNOSIS — D696 Thrombocytopenia, unspecified: Secondary | ICD-10-CM

## 2019-11-19 LAB — CBC WITH DIFFERENTIAL/PLATELET
Abs Immature Granulocytes: 15.04 10*3/uL — ABNORMAL HIGH (ref 0.00–0.07)
Basophils Absolute: 1.9 10*3/uL — ABNORMAL HIGH (ref 0.0–0.1)
Basophils Relative: 2 %
Eosinophils Absolute: 0.2 10*3/uL (ref 0.0–0.5)
Eosinophils Relative: 0 %
HCT: 45 % (ref 39.0–52.0)
HCT: 45 % (ref 39.0–52.0)
Hemoglobin: 14.3 g/dL (ref 13.0–17.0)
Hemoglobin: 14.3 g/dL (ref 13.0–17.0)
Immature Granulocytes: 13 %
Lymphocytes Relative: 6 %
Lymphs Abs: 6.9 10*3/uL — ABNORMAL HIGH (ref 0.7–4.0)
MCH: 29.1 pg (ref 26.0–34.0)
MCH: 29.1 pg (ref 26.0–34.0)
MCHC: 31.8 g/dL (ref 30.0–36.0)
MCHC: 31.8 g/dL (ref 30.0–36.0)
MCV: 91.5 fL (ref 80.0–100.0)
MCV: 91.5 fL (ref 80.0–100.0)
Monocytes Absolute: 59.9 10*3/uL — ABNORMAL HIGH (ref 0.1–1.0)
Monocytes Relative: 54 %
Neutro Abs: 28.1 10*3/uL — ABNORMAL HIGH (ref 1.7–7.7)
Neutrophils Relative %: 25 %
Platelets: 52 10*3/uL — ABNORMAL LOW (ref 150–400)
Platelets: 52 10*3/uL — ABNORMAL LOW (ref 150–400)
RBC: 4.92 MIL/uL (ref 4.22–5.81)
RBC: 4.92 MIL/uL (ref 4.22–5.81)
RDW: 17.3 % — ABNORMAL HIGH (ref 11.5–15.5)
RDW: 17.3 % — ABNORMAL HIGH (ref 11.5–15.5)
WBC: 112 10*3/uL (ref 4.0–10.5)
WBC: 112 10*3/uL (ref 4.0–10.5)
nRBC: 1.6 % — ABNORMAL HIGH (ref 0.0–0.2)
nRBC: 1.6 % — ABNORMAL HIGH (ref 0.0–0.2)

## 2019-11-19 LAB — COMPREHENSIVE METABOLIC PANEL
ALT: 30 U/L (ref 0–44)
AST: 42 U/L — ABNORMAL HIGH (ref 15–41)
Albumin: 4.3 g/dL (ref 3.5–5.0)
Alkaline Phosphatase: 55 U/L (ref 38–126)
Anion gap: 11 (ref 5–15)
BUN: 17 mg/dL (ref 8–23)
CO2: 22 mmol/L (ref 22–32)
Calcium: 9.4 mg/dL (ref 8.9–10.3)
Chloride: 105 mmol/L (ref 98–111)
Creatinine, Ser: 1.38 mg/dL — ABNORMAL HIGH (ref 0.61–1.24)
GFR calc Af Amer: >60 mL/min (ref >60)
GFR calc non Af Amer: 52 mL/min — ABNORMAL LOW (ref >60)
Glucose, Bld: 105 mg/dL — ABNORMAL HIGH (ref 70–99)
Potassium: 3.8 mmol/L (ref 3.5–5.1)
Sodium: 138 mmol/L (ref 135–145)
Total Bilirubin: 1 mg/dL (ref 0.3–1.2)
Total Protein: 8.4 g/dL — ABNORMAL HIGH (ref 6.5–8.1)

## 2019-11-19 LAB — PROTIME-INR
INR: 1.6 — ABNORMAL HIGH (ref 0.8–1.2)
Prothrombin Time: 18.8 seconds — ABNORMAL HIGH (ref 11.4–15.2)

## 2019-11-19 LAB — LACTATE DEHYDROGENASE: LDH: 669 U/L — ABNORMAL HIGH (ref 98–192)

## 2019-11-19 LAB — APTT: aPTT: 44 seconds — ABNORMAL HIGH (ref 24–36)

## 2019-11-19 MED ORDER — POTASSIUM CHLORIDE CRYS ER 20 MEQ PO TBCR: 40.0000 meq | EXTENDED_RELEASE_TABLET | Freq: Once | ORAL | Status: DC

## 2019-11-19 NOTE — Progress Notes (Signed)
I was contacted by the patient's as he is reporting recent complaints of malaise and fatigue and occasional sweats.  He was evaluated in urgent care earlier today and he was told that he has a critical white cell count.  His CBC was faxed to me today hemoglobin of 14 and white cell count of 104.  He denies any chest pain, shortness of breath or bleeding or neurological symptoms.  He denies any fevers.  Denies any active bleeding at this time.  His CBC from January 2021 mild elevation of his white cell count 14.3 with monocytosis.  The change in the white cell count is concerning for possible transformation into leukemia.  He is scheduled to see me on Monday, March 22 8:00 in the morning for an evaluation.  In the interim I gave him clear instructions to report to the emergency department he started developing symptoms of shortness of breath, fevers or neurological symptoms.

## 2019-11-19 NOTE — ED Notes (Signed)
Wife at bedside.

## 2019-11-19 NOTE — Progress Notes (Signed)
Oncology addendum:  The white cell count differential is unclear to me but appears to be immature cells which is concerning for transforming into acute leukemia.  Based on these findings I have recommended for the patient to report urgently to [facility] emergency department for repeat laboratory testing and more urgent evaluation.  I recommend repeating the following test including CBC with differential, coagulation parameters and peripheral blood flow cytometry.  I will alert Dr. Lindi Adie who is on-call for the group about these events.

## 2019-11-19 NOTE — Telephone Encounter (Signed)
Per Dr. Alen Blew, patient will be scheduled for a  follow-up on Monday with labs. Contacted patient and he stated he already spoke with Dr. Alen Blew and is aware of that he will have appointments on Monday.

## 2019-11-19 NOTE — ED Provider Notes (Addendum)
[facility] DEPT Provider Note  CSN: [id number] Arrival date & time: 11/19/19  1659     History Chief Complaint  Patient presents with  . Abnormal Lab    Jon Carter is a 69 y.o. adult.  Jon Carter is a 69 year old gentleman with a history of CMML, HLD, and STEMI with DES in 2010 presents today with abnormal lab findings.  The patient states Jon Carter has not been feeling well for the past week  Jon Carter states that Jon Carter has been having night sweats, hot flashes, and lethargy over the past week.  Jon Carter said Jon Carter thought Jon Carter may have had Covid, so Jon Carter went to [facility]. CBC at urgent care demonstrated leukocytosis to 120s, and the patient was told to come to the ED by his oncologist. Pt currently denies fevers, SOB, chest pain, abdominal pain, or changes in his bowel or bladder habits.        Past Medical History:  Diagnosis Date  . Adjustment disorder with anxiety 06/13/2009   Qualifier: Diagnosis of  By: Regis Bill MD, Standley Brooking  related to his sudden MI underwent counseling takes low-dose medication as needed had side effects from an SSRI   . Alcohol addiction ([place])   . Hyperlipidemia    Hx of low HDL zero CAC in 2000 at [facility]  . Low testosterone   . STEMI (ST elevation myocardial infarction) ([place]) 04/26/2009   left circumflex DES    Patient Active Problem List   Diagnosis Date Noted  . Elevated TSH 10/20/2017  . Monocytosis relative  10/20/2017  . Elevated LFTs 12/16/2015  . Testicular nodule vs cyst 12/10/2013  . Visit for preventive health examination 04/29/2012  . Leukopenia 05/14/2011  . Rosacea 05/14/2011  . Unspecified adverse effect of unspecified drug, medicinal and biological substance 10/19/2010  . Thrombocytopenia ([place]) 10/12/2010  . Hyperlipidemia   . HYPOGONADISM 02/05/2010  . Coronary atherosclerosis 06/13/2009  . POSTSURG PERCUT TRANSLUMINAL COR ANGPLSTY STS 06/13/2009  . ERECTILE DYSFUNCTION, MILD 06/29/2008  . HYPERTRIGLYCERIDEMIA  06/23/2007  . HYPERLIPIDEMIA 06/23/2007  . LOW HDL 06/23/2007   Past Surgical History:  Procedure Laterality Date  . CARDIAC CATHETERIZATION  05/02/2009   UA - patent Cfx stent, 50-60% LAD, 50-60% RCA with normal LV funtion (Dr. Adora Fridge)  . CATARACT EXTRACTION Left 2009   [place]  . CORONARY ANGIOPLASTY WITH STENT PLACEMENT  04/26/2009   ACS/STEMI - total occlusion of mid Cfx - Xience DES 2.75x97mm (Dr. Corky Downs)  . NM MYOCAR PERF WALL MOTION  04/2012   bruce myoview; mild perfusion defect in basal inferior region (infarct/scar or overlying attenuation); post-stress EF 53%, EKG negative for ischemia, hypertensive response to exercise; abnormal study but unchanged from previous study  . TRANSTHORACIC ECHOCARDIOGRAM  05/2009   EF=50-55%; trace MR; mild TR, normal RSVP; AV mildly sclerotic    OB History   No obstetric history on file.    Family History  Problem Relation Age of Onset  . Tongue cancer Father        tobacco  . Throat cancer Father   . Cirrhosis Father        also heart problems from etoh   . Hypertension Mother   . Heart Problems Other        mom  in 44s    Social History   Tobacco Use  . Smoking status: Never Smoker  . Smokeless tobacco: Never Used  Substance Use Topics  . Alcohol use: Yes    Alcohol/week:  2.0 standard drinks    Types: 2 Standard drinks or equivalent per week  . Drug use: No   Home Medications Prior to Admission medications   Medication Sig Start Date End Date Taking? Authorizing Provider  aspirin 81 MG tablet Take 81 mg by mouth daily.      [provider]  clonazePAM (KLONOPIN) 1 MG tablet Take 0.5-1 tablets (0.5-1 mg total) by mouth 2 (two) times daily as needed for anxiety. 08/20/19   Laurey Morale, MD  Coenzyme Q10 150 MG CAPS Take 150 mg by mouth daily.     [provider]  Colchicine 0.6 MG CAPS Take 1 capsule by mouth 2 (two) times daily. 10/29/18   Isaac Bliss, Rayford Halsted, MD  nitroGLYCERIN (NITROSTAT) 0.4  MG SL tablet Place 1 tablet (0.4 mg total) under the tongue every 5 (five) minutes as needed. 11/15/19   Panosh, Standley Brooking, MD  rosuvastatin (CRESTOR) 20 MG tablet Take 1 tablet (20 mg total) by mouth daily. 08/30/19   Troy Sine, MD  valACYclovir (VALTREX) 1000 MG tablet TAKE 2 TABLETS(2000 MG) BY MOUTH TWICE DAILY AS NEEDED FOR COLD SORES 11/03/19   Panosh, Standley Brooking, MD  Allergies    Patient has no known allergies.  Review of Systems   Review of Systems  Constitutional: Positive for appetite change and fatigue.  HENT: Negative.   Eyes: Negative.   Respiratory: Negative.   Cardiovascular: Negative.   Gastrointestinal: Negative.   Endocrine: Positive for heat intolerance.  Genitourinary: Negative.   Musculoskeletal: Negative.   Skin: Negative.   Allergic/Immunologic: Negative.   Neurological: Negative.   Hematological: Negative.   Psychiatric/Behavioral: Negative.    Physical Exam Updated Vital Signs BP (!) 144/94 (BP Location: Right Arm)   Pulse (!) 102   Temp 98.5 F (36.9 C)   Resp 16   Ht 5\' 11"  (1.803 m)   Wt 95.3 kg   SpO2 96%   BMI 29.29 kg/m   Physical Exam Vitals reviewed.  Constitutional:      General: Jon Carter is not in acute distress.    Appearance: Normal appearance. Jon Carter is normal weight. Jon Carter is not ill-appearing, toxic-appearing or diaphoretic.  HENT:     Head: Normocephalic and atraumatic.  Eyes:     Extraocular Movements: Extraocular movements intact.     Pupils: Pupils are equal, round, and reactive to light.  Cardiovascular:     Rate and Rhythm: Normal rate and regular rhythm.     Pulses: Normal pulses.     Heart sounds: Normal heart sounds. No murmur. No friction rub. No gallop.   Pulmonary:     Effort: Pulmonary effort is normal. No respiratory distress.     Breath sounds: Normal breath sounds. No wheezing or rales.  Abdominal:     General: Abdomen is flat. Bowel sounds are normal. There is no distension.     Palpations: Abdomen is soft.      Tenderness: There is no abdominal tenderness. There is no guarding.  Musculoskeletal:        General: Normal range of motion.     Cervical back: Neck supple. No tenderness.     Right lower leg: No edema.     Left lower leg: No edema.  Lymphadenopathy:     Cervical: No cervical adenopathy.  Skin:    General: Skin is warm.  Neurological:     General: No focal deficit present.     Mental Status: Jon Carter is alert and oriented to person, place,  and time.     Cranial Nerves: No cranial nerve deficit.  Psychiatric:        Mood and Affect: Mood normal.        Behavior: Behavior normal.    ED Results / Procedures / Treatments   Labs (all labs ordered are listed, but only abnormal results are displayed) Labs Reviewed  CBC WITH DIFFERENTIAL/PLATELET - Abnormal; Notable for the following components:      Result Value   WBC 112.0 (*)    RDW 17.3 (*)    Platelets 52 (*)    nRBC 1.6 (*)    All other components within normal limits  PROTIME-INR - Abnormal; Notable for the following components:   Prothrombin Time 18.8 (*)    INR 1.6 (*)    All other components within normal limits  APTT - Abnormal; Notable for the following components:   aPTT 44 (*)    All other components within normal limits  COMPREHENSIVE METABOLIC PANEL - Abnormal; Notable for the following components:   Glucose, Bld 105 (*)    Creatinine, Ser 1.38 (*)    Total Protein 8.4 (*)    AST 42 (*)    GFR calc non Af Amer 52 (*)    All other components within normal limits  LACTATE DEHYDROGENASE - Abnormal; Notable for the following components:   LDH 669 (*)    All other components within normal limits  CBC WITH DIFFERENTIAL/PLATELET - Abnormal; Notable for the following components:   WBC 112.0 (*)    RDW 17.3 (*)    Platelets 52 (*)    nRBC 1.6 (*)    Neutro Abs 28.1 (*)    Lymphs Abs 6.9 (*)    Monocytes Absolute 59.9 (*)    Basophils Absolute 1.9 (*)    Abs Immature Granulocytes 15.04 (*)    All other components  within normal limits  CD19 AND CD20, FLOW CYTOMETRY  LYMPHOCYTE SUBSETS, FLOW CYTOMETRY (INPT)   EKG None  Radiology No results found.  Procedures Procedures (including critical care time)  Medications Ordered in ED Medications  potassium chloride SA (KLOR-CON) CR tablet 40 mEq (has no administration in time range)   ED Course  I have reviewed the triage vital signs and the nursing notes.  Pertinent labs & imaging results that were available during my care of the patient were reviewed by me and considered in my medical decision making (see chart for details).    MDM Rules/Calculators/A&P                      CBC was notable for leukocytosis in the 120s, then repeat . There is in the context of recent night sweats, heat intolerance, 10 pound weight loss over the past month is consistent with CML.  Patient will need to be admitted.  Discussed the case with Dr. Lindi Adie of Heme-Onc, recommended transfer to [facility] for admission.   Discussed case with Dr. Linus Orn, the on call Heme-Onc physician at [facility]. They will admit the patient tonight.   Final Clinical Impression(s) / ED Diagnoses Final diagnoses:  None   Rx / DC Orders ED Discharge Orders    None       Earlene Plater, MD 11/19/19 2245    Earlene Plater, MD 11/19/19 2249    Isla Pence, MD 11/19/19 2333

## 2019-11-19 NOTE — ED Triage Notes (Addendum)
Patient states he has an elevated WBC. Patient states approx one week ago he began having fatigue and headaches. Patient states he was tested for Covid and was negative. Patient states he had a CXR, blood work, nasal swabs for the flu at [facility] today

## 2019-11-19 NOTE — Discharge Instructions (Addendum)
Thank you for allowing [name] to take care of you. Below is a summary of what we discussed:  Your blood tests indicate that you have leukemia. You are being transferred to [facility] for further evaluation and treatment.   Best of luck, and take care Jon Carter!

## 2019-11-19 NOTE — Telephone Encounter (Signed)
Scheduled appt per 3/19 sch message - pt aware of appt date and time   

## 2019-11-19 NOTE — ED Notes (Signed)
PT signed EMTAL paper. Copy placed in chart.

## 2019-11-19 NOTE — ED Notes (Signed)
Report given to [employer]

## 2019-11-19 NOTE — ED Notes (Signed)
Date and time results received: 11/19/19 1949 (use smartphrase ".now" to insert current time)  Test: WBC Critical Value: 112  Name of Provider Notified: Gilford Raid, MD  Orders Received? Or Actions Taken?: Orders Received - See Orders for details

## 2019-11-19 NOTE — ED Notes (Signed)
Report given to accepting RN at [facility], RN

## 2019-11-20 DIAGNOSIS — C9500 Acute leukemia of unspecified cell type not having achieved remission: Secondary | ICD-10-CM | POA: Diagnosis not present

## 2019-11-20 DIAGNOSIS — I10 Essential (primary) hypertension: Secondary | ICD-10-CM | POA: Diagnosis not present

## 2019-11-20 DIAGNOSIS — R001 Bradycardia, unspecified: Secondary | ICD-10-CM | POA: Diagnosis not present

## 2019-11-20 DIAGNOSIS — C9310 Chronic myelomonocytic leukemia not having achieved remission: Secondary | ICD-10-CM | POA: Diagnosis not present

## 2019-11-20 DIAGNOSIS — G935 Compression of brain: Secondary | ICD-10-CM | POA: Diagnosis not present

## 2019-11-20 DIAGNOSIS — E878 Other disorders of electrolyte and fluid balance, not elsewhere classified: Secondary | ICD-10-CM | POA: Diagnosis not present

## 2019-11-20 DIAGNOSIS — D649 Anemia, unspecified: Secondary | ICD-10-CM | POA: Diagnosis not present

## 2019-11-20 DIAGNOSIS — G9349 Other encephalopathy: Secondary | ICD-10-CM | POA: Diagnosis not present

## 2019-11-20 DIAGNOSIS — Z781 Physical restraint status: Secondary | ICD-10-CM | POA: Diagnosis not present

## 2019-11-20 DIAGNOSIS — S065X9A Traumatic subdural hemorrhage with loss of consciousness of unspecified duration, initial encounter: Secondary | ICD-10-CM | POA: Diagnosis not present

## 2019-11-20 DIAGNOSIS — D696 Thrombocytopenia, unspecified: Secondary | ICD-10-CM | POA: Diagnosis not present

## 2019-11-20 DIAGNOSIS — R4182 Altered mental status, unspecified: Secondary | ICD-10-CM | POA: Diagnosis not present

## 2019-11-20 DIAGNOSIS — Z01810 Encounter for preprocedural cardiovascular examination: Secondary | ICD-10-CM | POA: Diagnosis not present

## 2019-11-20 DIAGNOSIS — Z452 Encounter for adjustment and management of vascular access device: Secondary | ICD-10-CM | POA: Diagnosis not present

## 2019-11-20 DIAGNOSIS — J810 Acute pulmonary edema: Secondary | ICD-10-CM | POA: Diagnosis not present

## 2019-11-20 DIAGNOSIS — J984 Other disorders of lung: Secondary | ICD-10-CM | POA: Diagnosis not present

## 2019-11-20 DIAGNOSIS — R9401 Abnormal electroencephalogram [EEG]: Secondary | ICD-10-CM | POA: Diagnosis not present

## 2019-11-20 DIAGNOSIS — C9590 Leukemia, unspecified not having achieved remission: Secondary | ICD-10-CM | POA: Diagnosis not present

## 2019-11-20 DIAGNOSIS — C9211 Chronic myeloid leukemia, BCR/ABL-positive, in remission: Secondary | ICD-10-CM | POA: Diagnosis not present

## 2019-11-20 DIAGNOSIS — D709 Neutropenia, unspecified: Secondary | ICD-10-CM | POA: Diagnosis not present

## 2019-11-20 DIAGNOSIS — F05 Delirium due to known physiological condition: Secondary | ICD-10-CM | POA: Diagnosis not present

## 2019-11-20 DIAGNOSIS — D61810 Antineoplastic chemotherapy induced pancytopenia: Secondary | ICD-10-CM | POA: Diagnosis not present

## 2019-11-20 DIAGNOSIS — R143 Flatulence: Secondary | ICD-10-CM | POA: Diagnosis not present

## 2019-11-20 DIAGNOSIS — R633 Feeding difficulties: Secondary | ICD-10-CM | POA: Diagnosis not present

## 2019-11-20 DIAGNOSIS — J690 Pneumonitis due to inhalation of food and vomit: Secondary | ICD-10-CM | POA: Diagnosis not present

## 2019-11-20 DIAGNOSIS — R918 Other nonspecific abnormal finding of lung field: Secondary | ICD-10-CM | POA: Diagnosis not present

## 2019-11-20 DIAGNOSIS — R1310 Dysphagia, unspecified: Secondary | ICD-10-CM | POA: Diagnosis not present

## 2019-11-20 DIAGNOSIS — J9601 Acute respiratory failure with hypoxia: Secondary | ICD-10-CM | POA: Diagnosis not present

## 2019-11-20 DIAGNOSIS — S065X9D Traumatic subdural hemorrhage with loss of consciousness of unspecified duration, subsequent encounter: Secondary | ICD-10-CM | POA: Diagnosis not present

## 2019-11-20 DIAGNOSIS — D469 Myelodysplastic syndrome, unspecified: Secondary | ICD-10-CM | POA: Diagnosis not present

## 2019-11-20 DIAGNOSIS — Z9889 Other specified postprocedural states: Secondary | ICD-10-CM | POA: Diagnosis not present

## 2019-11-20 DIAGNOSIS — I6203 Nontraumatic chronic subdural hemorrhage: Secondary | ICD-10-CM | POA: Diagnosis not present

## 2019-11-20 DIAGNOSIS — G9608 Other cranial cerebrospinal fluid leak: Secondary | ICD-10-CM | POA: Diagnosis not present

## 2019-11-20 DIAGNOSIS — E876 Hypokalemia: Secondary | ICD-10-CM | POA: Diagnosis not present

## 2019-11-20 DIAGNOSIS — G9389 Other specified disorders of brain: Secondary | ICD-10-CM | POA: Diagnosis not present

## 2019-11-20 DIAGNOSIS — S065X1D Traumatic subdural hemorrhage with loss of consciousness of 30 minutes or less, subsequent encounter: Secondary | ICD-10-CM | POA: Diagnosis not present

## 2019-11-20 DIAGNOSIS — G4700 Insomnia, unspecified: Secondary | ICD-10-CM | POA: Diagnosis not present

## 2019-11-20 DIAGNOSIS — I6200 Nontraumatic subdural hemorrhage, unspecified: Secondary | ICD-10-CM | POA: Diagnosis not present

## 2019-11-20 DIAGNOSIS — E790 Hyperuricemia without signs of inflammatory arthritis and tophaceous disease: Secondary | ICD-10-CM | POA: Diagnosis not present

## 2019-11-20 DIAGNOSIS — E785 Hyperlipidemia, unspecified: Secondary | ICD-10-CM | POA: Diagnosis not present

## 2019-11-20 DIAGNOSIS — R531 Weakness: Secondary | ICD-10-CM | POA: Diagnosis not present

## 2019-11-20 DIAGNOSIS — D65 Disseminated intravascular coagulation [defibrination syndrome]: Secondary | ICD-10-CM | POA: Diagnosis not present

## 2019-11-20 DIAGNOSIS — E870 Hyperosmolality and hypernatremia: Secondary | ICD-10-CM | POA: Diagnosis not present

## 2019-11-20 DIAGNOSIS — I252 Old myocardial infarction: Secondary | ICD-10-CM | POA: Diagnosis not present

## 2019-11-20 DIAGNOSIS — I959 Hypotension, unspecified: Secondary | ICD-10-CM | POA: Diagnosis not present

## 2019-11-20 DIAGNOSIS — F40248 Other situational type phobia: Secondary | ICD-10-CM | POA: Diagnosis not present

## 2019-11-20 DIAGNOSIS — J90 Pleural effusion, not elsewhere classified: Secondary | ICD-10-CM | POA: Diagnosis not present

## 2019-11-20 DIAGNOSIS — R5383 Other fatigue: Secondary | ICD-10-CM | POA: Diagnosis not present

## 2019-11-20 DIAGNOSIS — S065X0A Traumatic subdural hemorrhage without loss of consciousness, initial encounter: Secondary | ICD-10-CM | POA: Diagnosis not present

## 2019-11-20 DIAGNOSIS — D61818 Other pancytopenia: Secondary | ICD-10-CM | POA: Diagnosis not present

## 2019-11-20 DIAGNOSIS — Z95828 Presence of other vascular implants and grafts: Secondary | ICD-10-CM | POA: Diagnosis not present

## 2019-11-20 DIAGNOSIS — I2510 Atherosclerotic heart disease of native coronary artery without angina pectoris: Secondary | ICD-10-CM | POA: Diagnosis not present

## 2019-11-20 DIAGNOSIS — E8770 Fluid overload, unspecified: Secondary | ICD-10-CM | POA: Diagnosis not present

## 2019-11-20 DIAGNOSIS — R197 Diarrhea, unspecified: Secondary | ICD-10-CM | POA: Diagnosis not present

## 2019-11-20 DIAGNOSIS — S065X0D Traumatic subdural hemorrhage without loss of consciousness, subsequent encounter: Secondary | ICD-10-CM | POA: Diagnosis not present

## 2019-11-20 DIAGNOSIS — E883 Tumor lysis syndrome: Secondary | ICD-10-CM | POA: Diagnosis not present

## 2019-11-20 DIAGNOSIS — Z4659 Encounter for fitting and adjustment of other gastrointestinal appliance and device: Secondary | ICD-10-CM | POA: Diagnosis not present

## 2019-11-20 DIAGNOSIS — J189 Pneumonia, unspecified organism: Secondary | ICD-10-CM | POA: Diagnosis not present

## 2019-11-20 DIAGNOSIS — R5081 Fever presenting with conditions classified elsewhere: Secondary | ICD-10-CM | POA: Diagnosis not present

## 2019-11-20 DIAGNOSIS — Z20822 Contact with and (suspected) exposure to covid-19: Secondary | ICD-10-CM | POA: Diagnosis not present

## 2019-11-20 DIAGNOSIS — I6201 Nontraumatic acute subdural hemorrhage: Secondary | ICD-10-CM | POA: Diagnosis not present

## 2019-11-20 DIAGNOSIS — C9200 Acute myeloblastic leukemia, not having achieved remission: Secondary | ICD-10-CM | POA: Diagnosis not present

## 2019-11-20 DIAGNOSIS — R509 Fever, unspecified: Secondary | ICD-10-CM | POA: Diagnosis not present

## 2019-11-20 DIAGNOSIS — Z862 Personal history of diseases of the blood and blood-forming organs and certain disorders involving the immune mechanism: Secondary | ICD-10-CM | POA: Diagnosis not present

## 2019-11-20 DIAGNOSIS — E871 Hypo-osmolality and hyponatremia: Secondary | ICD-10-CM | POA: Diagnosis not present

## 2019-11-20 DIAGNOSIS — R1312 Dysphagia, oropharyngeal phase: Secondary | ICD-10-CM | POA: Diagnosis not present

## 2019-11-22 ENCOUNTER — Other Ambulatory Visit: Payer: Medicare PPO

## 2019-11-22 ENCOUNTER — Ambulatory Visit: Payer: Medicare PPO | Admitting: Oncology

## 2019-11-22 LAB — FLOW CYTOMETRY REQUEST - FLUID (INPATIENT)

## 2019-11-22 LAB — SURGICAL PATHOLOGY

## 2019-11-22 MED ORDER — HYDROXYUREA 500 MG PO CAPS
1000.00 | ORAL_CAPSULE | ORAL | Status: DC
Start: 2019-11-22 — End: 2019-11-22

## 2019-11-22 MED ORDER — LACTATED RINGERS IV SOLN
INTRAVENOUS | Status: DC
Start: ? — End: 2019-11-22

## 2019-11-22 MED ORDER — SENNOSIDES-DOCUSATE SODIUM 8.6-50 MG PO TABS
1.00 | ORAL_TABLET | ORAL | Status: DC
Start: ? — End: 2019-11-22

## 2019-11-22 MED ORDER — GENERIC EXTERNAL MEDICATION
500.00 | Status: DC
Start: 2019-11-22 — End: 2019-11-22

## 2019-11-22 MED ORDER — SODIUM CHLORIDE 0.9 % IV SOLN
250.00 | INTRAVENOUS | Status: DC
Start: ? — End: 2019-11-22

## 2019-11-22 MED ORDER — ALLOPURINOL 300 MG PO TABS
300.00 | ORAL_TABLET | ORAL | Status: DC
Start: 2019-11-26 — End: 2019-11-22

## 2019-11-22 MED ORDER — ONDANSETRON HCL 4 MG/2ML IJ SOLN
4.00 | INTRAMUSCULAR | Status: DC
Start: ? — End: 2019-11-22

## 2019-11-22 MED ORDER — ROSUVASTATIN CALCIUM 5 MG PO TABS
5.00 | ORAL_TABLET | ORAL | Status: DC
Start: 2020-01-24 — End: 2019-11-22

## 2019-11-25 MED ORDER — SODIUM CHLORIDE FLUSH 0.9 % IV SOLN
10.00 | INTRAVENOUS | Status: DC
Start: 2020-01-24 — End: 2019-11-25

## 2019-11-25 MED ORDER — DIPHENHYDRAMINE HCL 50 MG/ML IJ SOLN
25.00 | INTRAMUSCULAR | Status: DC
Start: ? — End: 2019-11-25

## 2019-11-25 MED ORDER — SODIUM CHLORIDE FLUSH 0.9 % IV SOLN
10.00 | INTRAVENOUS | Status: DC
Start: ? — End: 2019-11-25

## 2019-11-25 MED ORDER — ONDANSETRON HCL 8 MG PO TABS
8.00 | ORAL_TABLET | ORAL | Status: DC
Start: 2019-11-25 — End: 2019-11-25

## 2019-11-25 MED ORDER — EPINEPHRINE PF 1 MG/ML IJ SOLN
0.30 | INTRAMUSCULAR | Status: DC
Start: ? — End: 2019-11-25

## 2019-11-25 MED ORDER — OXYCODONE HCL 5 MG PO TABS
5.00 | ORAL_TABLET | ORAL | Status: DC
Start: ? — End: 2019-11-25

## 2019-11-25 MED ORDER — LORAZEPAM 0.5 MG PO TABS
0.50 | ORAL_TABLET | ORAL | Status: DC
Start: ? — End: 2019-11-25

## 2019-11-25 MED ORDER — METHYLPREDNISOLONE SODIUM SUCC 125 MG IJ SOLR
125.00 | INTRAMUSCULAR | Status: DC
Start: ? — End: 2019-11-25

## 2019-11-25 MED ORDER — LEVETIRACETAM 500 MG PO TABS
500.00 | ORAL_TABLET | ORAL | Status: DC
Start: 2019-11-25 — End: 2019-11-25

## 2019-11-25 MED ORDER — SODIUM CHLORIDE 0.9 % IV SOLN
INTRAVENOUS | Status: DC
Start: ? — End: 2019-11-25

## 2019-11-25 MED ORDER — PROCHLORPERAZINE EDISYLATE 10 MG/2ML IJ SOLN
5.00 | INTRAMUSCULAR | Status: DC
Start: ? — End: 2019-11-25

## 2019-11-25 MED ORDER — ZINC SULFATE 220 (50 ZN) MG PO CAPS
220.00 | ORAL_CAPSULE | ORAL | Status: DC
Start: 2019-12-03 — End: 2019-11-25

## 2019-11-25 MED ORDER — GENERIC EXTERNAL MEDICATION
Status: DC
Start: 2019-11-26 — End: 2019-11-25

## 2019-11-25 MED ORDER — CHOLECALCIFEROL 25 MCG (1000 UT) PO TABS
1000.00 | ORAL_TABLET | ORAL | Status: DC
Start: 2020-01-25 — End: 2019-11-25

## 2019-12-02 MED ORDER — SENNOSIDES 8.6 MG PO TABS
17.20 | ORAL_TABLET | ORAL | Status: DC
Start: 2019-12-02 — End: 2019-12-02

## 2019-12-02 MED ORDER — GENERIC EXTERNAL MEDICATION
25.00 | Status: DC
Start: 2019-12-02 — End: 2019-12-02

## 2019-12-02 MED ORDER — HYDROXYZINE HCL 25 MG PO TABS
25.00 | ORAL_TABLET | ORAL | Status: DC
Start: ? — End: 2019-12-02

## 2019-12-02 MED ORDER — MOXIFLOXACIN HCL 400 MG PO TABS
400.00 | ORAL_TABLET | ORAL | Status: DC
Start: 2019-12-03 — End: 2019-12-02

## 2019-12-02 MED ORDER — POLYETHYLENE GLYCOL 3350 17 GM/SCOOP PO POWD
34.00 | ORAL | Status: DC
Start: 2019-12-02 — End: 2019-12-02

## 2019-12-02 MED ORDER — ACYCLOVIR 400 MG PO TABS
400.00 | ORAL_TABLET | ORAL | Status: DC
Start: 2020-01-24 — End: 2019-12-02

## 2019-12-02 MED ORDER — POSACONAZOLE 100 MG PO TBEC
300.00 | DELAYED_RELEASE_TABLET | ORAL | Status: DC
Start: 2019-12-03 — End: 2019-12-02

## 2019-12-02 MED ORDER — MELATONIN 3 MG PO TABS
6.00 | ORAL_TABLET | ORAL | Status: DC
Start: ? — End: 2019-12-02

## 2019-12-02 MED ORDER — SODIUM CHLORIDE 0.9 % IV SOLN
250.00 | INTRAVENOUS | Status: DC
Start: ? — End: 2019-12-02

## 2019-12-16 MED ORDER — POTASSIUM CHLORIDE 20 MEQ PO PACK
40.00 | PACK | ORAL | Status: DC
Start: 2019-12-16 — End: 2019-12-16

## 2019-12-16 MED ORDER — GENERIC EXTERNAL MEDICATION
300.00 | Status: DC
Start: 2019-12-23 — End: 2019-12-16

## 2019-12-16 MED ORDER — CHLORHEXIDINE GLUCONATE 0.12 % MT SOLN
15.00 | OROMUCOSAL | Status: DC
Start: 2019-12-22 — End: 2019-12-16

## 2019-12-16 MED ORDER — CARBOXYMETHYLCELLULOSE SODIUM 0.5 % OP SOLN
1.00 | OPHTHALMIC | Status: DC
Start: ? — End: 2019-12-16

## 2019-12-16 MED ORDER — SENNOSIDES 8.6 MG PO TABS
17.20 | ORAL_TABLET | ORAL | Status: DC
Start: 2019-12-16 — End: 2019-12-16

## 2019-12-16 MED ORDER — CHLORHEXIDINE GLUCONATE 0.12 % MT SOLN
15.00 | OROMUCOSAL | Status: DC
Start: ? — End: 2019-12-16

## 2019-12-16 MED ORDER — SODIUM CHLORIDE 0.9 % IV SOLN
250.00 | INTRAVENOUS | Status: DC
Start: ? — End: 2019-12-16

## 2019-12-16 MED ORDER — PHENYLEPHRINE-MINERAL OIL-PET 0.25-14-74.9 % RE OINT
TOPICAL_OINTMENT | RECTAL | Status: DC
Start: ? — End: 2019-12-16

## 2019-12-16 MED ORDER — PRO-STAT PO LIQD
ORAL | Status: DC
Start: 2019-12-16 — End: 2019-12-16

## 2019-12-16 MED ORDER — GRANISETRON HCL 1 MG PO TABS
1.00 | ORAL_TABLET | ORAL | Status: DC
Start: ? — End: 2019-12-16

## 2019-12-16 MED ORDER — GENERIC EXTERNAL MEDICATION
2.00 | Status: DC
Start: 2019-12-16 — End: 2019-12-16

## 2019-12-16 MED ORDER — GENERIC EXTERNAL MEDICATION
Status: DC
Start: ? — End: 2019-12-16

## 2019-12-16 MED ORDER — DM-GG-POT CITRATE-CITRIC ACID PO
1000.00 | ORAL | Status: DC
Start: 2019-12-22 — End: 2019-12-16

## 2019-12-16 MED ORDER — POLYETHYLENE GLYCOL 3350 17 GM/SCOOP PO POWD
34.00 | ORAL | Status: DC
Start: 2019-12-16 — End: 2019-12-16

## 2019-12-16 MED ORDER — ACETAMINOPHEN 325 MG PO TABS
650.00 | ORAL_TABLET | ORAL | Status: DC
Start: ? — End: 2019-12-16

## 2019-12-16 MED ORDER — FENTANYL CITRATE (PF) 50 MCG/ML IJ SOLN
50.00 | INTRAMUSCULAR | Status: DC
Start: ? — End: 2019-12-16

## 2019-12-16 MED ORDER — PROPOFOL 100 MG/10ML IV EMUL
0.00 | INTRAVENOUS | Status: DC
Start: ? — End: 2019-12-16

## 2019-12-16 MED ORDER — HYDRALAZINE HCL 20 MG/ML IJ SOLN
10.00 | INTRAMUSCULAR | Status: DC
Start: ? — End: 2019-12-16

## 2019-12-20 MED ORDER — METOPROLOL TARTRATE 25 MG PO TABS
25.00 | ORAL_TABLET | ORAL | Status: DC
Start: 2019-12-21 — End: 2019-12-20

## 2019-12-20 MED ORDER — GENERIC EXTERNAL MEDICATION
1.50 | Status: DC
Start: 2019-12-22 — End: 2019-12-20

## 2019-12-20 MED ORDER — CLEVIDIPINE 50 MG/100ML IV EMUL
0.00 | INTRAVENOUS | Status: DC
Start: ? — End: 2019-12-20

## 2019-12-20 MED ORDER — AMLODIPINE BESYLATE 5 MG PO TABS
10.00 | ORAL_TABLET | ORAL | Status: DC
Start: 2020-01-25 — End: 2019-12-20

## 2019-12-20 MED ORDER — GENERIC EXTERNAL MEDICATION
1.00 | Status: DC
Start: 2019-12-22 — End: 2019-12-20

## 2019-12-20 MED ORDER — HYDRALAZINE HCL 20 MG/ML IJ SOLN
20.00 | INTRAMUSCULAR | Status: DC
Start: ? — End: 2019-12-20

## 2019-12-20 MED ORDER — GENERIC EXTERNAL MEDICATION
75.00 | Status: DC
Start: 2019-12-21 — End: 2019-12-20

## 2019-12-20 MED ORDER — SENNOSIDES 8.6 MG PO TABS
17.20 | ORAL_TABLET | ORAL | Status: DC
Start: 2019-12-23 — End: 2019-12-20

## 2019-12-20 MED ORDER — NASAL SPRAY 0.05 % NA SOLN
2.00 | NASAL | Status: DC
Start: ? — End: 2019-12-20

## 2019-12-20 MED ORDER — POLYETHYLENE GLYCOL 3350 17 GM/SCOOP PO POWD
17.00 | ORAL | Status: DC
Start: 2019-12-22 — End: 2019-12-20

## 2019-12-20 MED ORDER — GENERIC EXTERNAL MEDICATION
Status: DC
Start: ? — End: 2019-12-20

## 2019-12-20 MED ORDER — Medication
Status: DC
Start: 2019-12-22 — End: 2019-12-20

## 2019-12-20 MED ORDER — POTASSIUM CHLORIDE 20 MEQ PO PACK
40.00 | PACK | ORAL | Status: DC
Start: 2019-12-22 — End: 2019-12-20

## 2019-12-20 MED ORDER — ENALAPRILAT 1.25 MG/ML IV INJ
1.25 | INJECTION | INTRAVENOUS | Status: DC
Start: ? — End: 2019-12-20

## 2019-12-20 MED ORDER — GENERIC EXTERNAL MEDICATION
40.00 | Status: DC
Start: 2019-12-23 — End: 2019-12-20

## 2019-12-21 MED ORDER — GENERIC EXTERNAL MEDICATION
Status: DC
Start: ? — End: 2019-12-21

## 2019-12-21 MED ORDER — SODIUM CHLORIDE 0.9 % IV SOLN
250.00 | INTRAVENOUS | Status: DC
Start: ? — End: 2019-12-21

## 2019-12-22 ENCOUNTER — Inpatient Hospital Stay: Payer: Medicare PPO | Attending: Oncology | Admitting: Oncology

## 2019-12-22 ENCOUNTER — Inpatient Hospital Stay: Payer: Medicare PPO

## 2019-12-22 MED ORDER — METHYLNALTREXONE BROMIDE PO
0.00 | ORAL | Status: DC
Start: ? — End: 2019-12-22

## 2019-12-22 MED ORDER — HYDRALAZINE HCL 50 MG PO TABS
100.00 | ORAL_TABLET | ORAL | Status: DC
Start: 2020-01-24 — End: 2019-12-22

## 2019-12-22 MED ORDER — LISINOPRIL 5 MG PO TABS
10.00 | ORAL_TABLET | ORAL | Status: DC
Start: 2020-01-25 — End: 2019-12-22

## 2019-12-22 MED ORDER — SODIUM CHLORIDE 0.9 % IV SOLN
250.00 | INTRAVENOUS | Status: DC
Start: ? — End: 2019-12-22

## 2019-12-22 MED ORDER — GENERIC EXTERNAL MEDICATION
Status: DC
Start: ? — End: 2019-12-22

## 2019-12-22 MED ORDER — ATROPINE SULFATE 0.5 MG/5ML IJ SOSY
0.50 | PREFILLED_SYRINGE | INTRAMUSCULAR | Status: DC
Start: 2019-12-22 — End: 2019-12-22

## 2020-01-24 DIAGNOSIS — C9590 Leukemia, unspecified not having achieved remission: Secondary | ICD-10-CM | POA: Diagnosis not present

## 2020-01-24 DIAGNOSIS — R1312 Dysphagia, oropharyngeal phase: Secondary | ICD-10-CM | POA: Diagnosis not present

## 2020-01-24 DIAGNOSIS — I69298 Other sequelae of other nontraumatic intracranial hemorrhage: Secondary | ICD-10-CM | POA: Diagnosis not present

## 2020-01-24 DIAGNOSIS — I69291 Dysphagia following other nontraumatic intracranial hemorrhage: Secondary | ICD-10-CM | POA: Diagnosis not present

## 2020-01-24 DIAGNOSIS — R490 Dysphonia: Secondary | ICD-10-CM | POA: Diagnosis not present

## 2020-01-24 DIAGNOSIS — J90 Pleural effusion, not elsewhere classified: Secondary | ICD-10-CM | POA: Diagnosis not present

## 2020-01-24 DIAGNOSIS — C9200 Acute myeloblastic leukemia, not having achieved remission: Secondary | ICD-10-CM | POA: Diagnosis not present

## 2020-01-24 DIAGNOSIS — J189 Pneumonia, unspecified organism: Secondary | ICD-10-CM | POA: Diagnosis not present

## 2020-01-24 DIAGNOSIS — R1310 Dysphagia, unspecified: Secondary | ICD-10-CM | POA: Diagnosis not present

## 2020-01-24 DIAGNOSIS — E871 Hypo-osmolality and hyponatremia: Secondary | ICD-10-CM | POA: Diagnosis not present

## 2020-01-24 DIAGNOSIS — I6200 Nontraumatic subdural hemorrhage, unspecified: Secondary | ICD-10-CM | POA: Diagnosis not present

## 2020-01-24 DIAGNOSIS — D61818 Other pancytopenia: Secondary | ICD-10-CM | POA: Diagnosis not present

## 2020-01-24 DIAGNOSIS — R633 Feeding difficulties: Secondary | ICD-10-CM | POA: Diagnosis not present

## 2020-01-24 DIAGNOSIS — R9431 Abnormal electrocardiogram [ECG] [EKG]: Secondary | ICD-10-CM | POA: Diagnosis not present

## 2020-01-24 DIAGNOSIS — R197 Diarrhea, unspecified: Secondary | ICD-10-CM | POA: Diagnosis not present

## 2020-01-24 DIAGNOSIS — Z7409 Other reduced mobility: Secondary | ICD-10-CM | POA: Diagnosis not present

## 2020-01-24 DIAGNOSIS — R5081 Fever presenting with conditions classified elsewhere: Secondary | ICD-10-CM | POA: Diagnosis not present

## 2020-01-24 DIAGNOSIS — D709 Neutropenia, unspecified: Secondary | ICD-10-CM | POA: Diagnosis not present

## 2020-01-24 DIAGNOSIS — R269 Unspecified abnormalities of gait and mobility: Secondary | ICD-10-CM | POA: Diagnosis not present

## 2020-01-24 MED ORDER — PROCHLORPERAZINE EDISYLATE 10 MG/2ML IJ SOLN
5.00 | INTRAMUSCULAR | Status: DC
Start: ? — End: 2020-01-24

## 2020-01-24 MED ORDER — AMILORIDE HCL 5 MG PO TABS
5.00 | ORAL_TABLET | ORAL | Status: DC
Start: 2020-01-24 — End: 2020-01-24

## 2020-01-24 MED ORDER — LORAZEPAM 0.5 MG PO TABS
0.50 | ORAL_TABLET | ORAL | Status: DC
Start: ? — End: 2020-01-24

## 2020-01-24 MED ORDER — THIAMINE HCL 100 MG PO TABS
100.00 | ORAL_TABLET | ORAL | Status: DC
Start: 2020-01-25 — End: 2020-01-24

## 2020-01-24 MED ORDER — TRAMADOL HCL 50 MG PO TABS
50.00 | ORAL_TABLET | ORAL | Status: DC
Start: ? — End: 2020-01-24

## 2020-01-24 MED ORDER — ENALAPRILAT 1.25 MG/ML IV INJ
1.25 | INJECTION | INTRAVENOUS | Status: DC
Start: ? — End: 2020-01-24

## 2020-01-24 MED ORDER — MOXIFLOXACIN HCL 400 MG PO TABS
400.00 | ORAL_TABLET | ORAL | Status: DC
Start: 2020-01-24 — End: 2020-01-24

## 2020-01-24 MED ORDER — DERMACERIN EX CREA
TOPICAL_CREAM | CUTANEOUS | Status: DC
Start: ? — End: 2020-01-24

## 2020-01-24 MED ORDER — POTASSIUM CHLORIDE 20 MEQ PO PACK
40.00 | PACK | ORAL | Status: DC
Start: 2020-01-24 — End: 2020-01-24

## 2020-01-24 MED ORDER — METOCLOPRAMIDE HCL 5 MG PO TABS
5.00 | ORAL_TABLET | ORAL | Status: DC
Start: 2020-01-24 — End: 2020-01-24

## 2020-01-24 MED ORDER — GENERIC EXTERNAL MEDICATION
Status: DC
Start: 2020-01-24 — End: 2020-01-24

## 2020-01-24 MED ORDER — POSACONAZOLE 100 MG PO TBEC
300.00 | DELAYED_RELEASE_TABLET | ORAL | Status: DC
Start: 2020-01-25 — End: 2020-01-24

## 2020-01-24 MED ORDER — TRIAMCINOLONE ACETONIDE 0.1 % EX CREA
TOPICAL_CREAM | CUTANEOUS | Status: DC
Start: ? — End: 2020-01-24

## 2020-01-24 MED ORDER — VENETOCLAX 100 MG PO TABS
100.00 | ORAL_TABLET | ORAL | Status: DC
Start: 2020-01-24 — End: 2020-01-24

## 2020-01-24 MED ORDER — PANTOPRAZOLE SODIUM 40 MG PO TBEC
40.00 | DELAYED_RELEASE_TABLET | ORAL | Status: DC
Start: 2020-01-25 — End: 2020-01-24

## 2020-01-24 MED ORDER — QUINTABS PO TABS
1.00 | ORAL_TABLET | ORAL | Status: DC
Start: 2020-01-25 — End: 2020-01-24

## 2020-01-24 MED ORDER — SODIUM CHLORIDE 0.9 % IV SOLN
INTRAVENOUS | Status: DC
Start: ? — End: 2020-01-24

## 2020-02-04 MED ORDER — THIAMINE HCL 100 MG PO TABS
100.00 | ORAL_TABLET | ORAL | Status: DC
Start: 2020-02-05 — End: 2020-02-04

## 2020-02-04 MED ORDER — ACYCLOVIR 400 MG PO TABS
400.00 | ORAL_TABLET | ORAL | Status: DC
Start: 2020-02-04 — End: 2020-02-04

## 2020-02-04 MED ORDER — LEVETIRACETAM 500 MG PO TABS
1000.00 | ORAL_TABLET | ORAL | Status: DC
Start: 2020-02-04 — End: 2020-02-04

## 2020-02-04 MED ORDER — CHOLECALCIFEROL 25 MCG (1000 UT) PO TABS
1000.00 | ORAL_TABLET | ORAL | Status: DC
Start: 2020-02-05 — End: 2020-02-04

## 2020-02-04 MED ORDER — SODIUM CHLORIDE 0.9 % IV SOLN
250.00 | INTRAVENOUS | Status: DC
Start: ? — End: 2020-02-04

## 2020-02-04 MED ORDER — POSACONAZOLE 100 MG PO TBEC
300.00 | DELAYED_RELEASE_TABLET | ORAL | Status: DC
Start: 2020-02-05 — End: 2020-02-04

## 2020-02-04 MED ORDER — HYDROXYZINE HCL 25 MG PO TABS
25.00 | ORAL_TABLET | ORAL | Status: DC
Start: ? — End: 2020-02-04

## 2020-02-04 MED ORDER — PANTOPRAZOLE SODIUM 40 MG PO TBEC
40.00 | DELAYED_RELEASE_TABLET | ORAL | Status: DC
Start: 2020-02-05 — End: 2020-02-04

## 2020-02-04 MED ORDER — MELATONIN 3 MG PO TABS
6.00 | ORAL_TABLET | ORAL | Status: DC
Start: ? — End: 2020-02-04

## 2020-02-04 MED ORDER — MOXIFLOXACIN HCL 400 MG PO TABS
400.00 | ORAL_TABLET | ORAL | Status: DC
Start: 2020-02-05 — End: 2020-02-04

## 2020-02-04 MED ORDER — CARBOXYMETHYLCELLULOSE SODIUM 0.5 % OP SOLN
1.00 | OPHTHALMIC | Status: DC
Start: ? — End: 2020-02-04

## 2020-02-04 MED ORDER — VENETOCLAX 100 MG PO TABS
100.00 | ORAL_TABLET | ORAL | Status: DC
Start: 2020-02-04 — End: 2020-02-04

## 2020-02-04 MED ORDER — TRIAMCINOLONE ACETONIDE 0.1 % EX CREA
TOPICAL_CREAM | CUTANEOUS | Status: DC
Start: ? — End: 2020-02-04

## 2020-02-04 MED ORDER — SODIUM CHLORIDE FLUSH 0.9 % IV SOLN
10.00 | INTRAVENOUS | Status: DC
Start: 2020-02-04 — End: 2020-02-04

## 2020-02-04 MED ORDER — TRAMADOL HCL 50 MG PO TABS
50.00 | ORAL_TABLET | ORAL | Status: DC
Start: ? — End: 2020-02-04

## 2020-02-04 MED ORDER — QUINTABS PO TABS
1.00 | ORAL_TABLET | ORAL | Status: DC
Start: 2020-02-05 — End: 2020-02-04

## 2020-02-04 MED ORDER — ONDANSETRON HCL 8 MG PO TABS
8.00 | ORAL_TABLET | ORAL | Status: DC
Start: ? — End: 2020-02-04

## 2020-02-04 MED ORDER — TERAZOSIN HCL 1 MG PO CAPS
1.00 | ORAL_CAPSULE | ORAL | Status: DC
Start: 2020-02-04 — End: 2020-02-04

## 2020-02-04 MED ORDER — ROSUVASTATIN CALCIUM 5 MG PO TABS
5.00 | ORAL_TABLET | ORAL | Status: DC
Start: 2020-02-04 — End: 2020-02-04

## 2020-02-04 MED ORDER — PHENYLEPHRINE-MINERAL OIL-PET 0.25-14-74.9 % RE OINT
TOPICAL_OINTMENT | RECTAL | Status: DC
Start: ? — End: 2020-02-04

## 2020-02-04 MED ORDER — SODIUM CHLORIDE FLUSH 0.9 % IV SOLN
10.00 | INTRAVENOUS | Status: DC
Start: ? — End: 2020-02-04

## 2020-02-04 MED ORDER — DERMACERIN EX CREA
TOPICAL_CREAM | CUTANEOUS | Status: DC
Start: ? — End: 2020-02-04

## 2020-02-07 DIAGNOSIS — C9200 Acute myeloblastic leukemia, not having achieved remission: Secondary | ICD-10-CM | POA: Diagnosis not present

## 2020-02-08 ENCOUNTER — Other Ambulatory Visit: Payer: Self-pay

## 2020-02-08 ENCOUNTER — Ambulatory Visit: Payer: Medicare PPO | Attending: Physical Medicine & Rehabilitation

## 2020-02-08 DIAGNOSIS — R2681 Unsteadiness on feet: Secondary | ICD-10-CM | POA: Diagnosis not present

## 2020-02-08 DIAGNOSIS — R2689 Other abnormalities of gait and mobility: Secondary | ICD-10-CM

## 2020-02-08 DIAGNOSIS — R278 Other lack of coordination: Secondary | ICD-10-CM | POA: Diagnosis not present

## 2020-02-08 DIAGNOSIS — R471 Dysarthria and anarthria: Secondary | ICD-10-CM | POA: Diagnosis not present

## 2020-02-08 DIAGNOSIS — R209 Unspecified disturbances of skin sensation: Secondary | ICD-10-CM | POA: Diagnosis not present

## 2020-02-08 DIAGNOSIS — S065X9A Traumatic subdural hemorrhage with loss of consciousness of unspecified duration, initial encounter: Secondary | ICD-10-CM | POA: Diagnosis not present

## 2020-02-08 DIAGNOSIS — R498 Other voice and resonance disorders: Secondary | ICD-10-CM | POA: Diagnosis not present

## 2020-02-08 DIAGNOSIS — R1312 Dysphagia, oropharyngeal phase: Secondary | ICD-10-CM | POA: Insufficient documentation

## 2020-02-08 DIAGNOSIS — M6281 Muscle weakness (generalized): Secondary | ICD-10-CM | POA: Insufficient documentation

## 2020-02-08 DIAGNOSIS — S065XAA Traumatic subdural hemorrhage with loss of consciousness status unknown, initial encounter: Secondary | ICD-10-CM

## 2020-02-08 NOTE — Therapy (Signed)
[facility] [address] [place], [place], [address] Phone: [phone]   Fax:  [phone]  Physical Therapy Treatment  Patient Details  Name: Jon Carter MRN: [id number] Date of Birth: November 27, 1950 Referring Provider (PT): Dr. Levada Schilling   Encounter Date: 02/08/2020  PT End of Session - 02/08/20 1706    Visit Number  1    Number of Visits  13    Date for PT Re-Evaluation  03/21/20    PT Start Time  1400    PT Stop Time  1445    PT Time Calculation (min)  45 min    Equipment Utilized During Treatment  Gait belt    Activity Tolerance  Patient tolerated treatment well    Behavior During Therapy  [facility] for tasks assessed/performed       Past Medical History:  Diagnosis Date  . Adjustment disorder with anxiety 06/13/2009   Qualifier: Diagnosis of  By: Regis Bill MD, Standley Brooking  related to his sudden MI underwent counseling takes low-dose medication as needed had side effects from an SSRI   . Alcohol addiction ([place])   . Hyperlipidemia    Hx of low HDL zero CAC in 2000 at [facility]  . Low testosterone   . STEMI (ST elevation myocardial infarction) ([place]) 04/26/2009   left circumflex DES     Past Surgical History:  Procedure Laterality Date  . CARDIAC CATHETERIZATION  05/02/2009   UA - patent Cfx stent, 50-60% LAD, 50-60% RCA with normal LV funtion (Dr. Adora Fridge)  . CATARACT EXTRACTION Left 2009   [facility]  . CORONARY ANGIOPLASTY WITH STENT PLACEMENT  04/26/2009   ACS/STEMI - total occlusion of mid Cfx - Xience DES 2.75x46mm (Dr. Corky Downs)  . NM MYOCAR PERF WALL MOTION  04/2012   bruce myoview; mild perfusion defect in basal inferior region (infarct/scar or overlying attenuation); post-stress EF 53%, EKG negative for ischemia, hypertensive response to exercise; abnormal study but unchanged from previous study  . TRANSTHORACIC ECHOCARDIOGRAM  05/2009   EF=50-55%; trace MR; mild TR, normal RSVP; AV mildly sclerotic    There  were no vitals filed for this visit.  Subjective Assessment - 02/08/20 1558    Subjective  Pt reports he was feeling weak and decided to go see the doctor who sent him to ED. He was diagnosed with Acute Myeloid Leukemia (AML). He was given chemotherapy and developed brain bleed. He had neurosurgey with burr holes on 12/10/19. He had hospital stay in [facility] from 11/19/19 to 01/23/20 dealing with complications from AML and subdural hematoma. He was in the sub-actue rehab from 01/24/20 to 02/04/20. He is using walker for indoor and outdoor mobility. He has not gone up the second floor in his home. pt denies any pain. Pt reports of mild numbness in figer tips of hands. Pt denies numbness/tingling in his feet. Pt reports he is currently taking chemotherapy pill everyday and he gets infusion treatment for 7 consecutive days every month.    Patient is accompained by:  Family member   Wife   Pertinent History  PMHx significant for thrombocytopenia, epistaxis, monocytosis, HLD, MI s/p DES, CMML2, myeloblastic syndrome, AML, subdural hematoma    Limitations  Standing;Walking;House hold activities    How long can you sit comfortably?  no limits    How long can you stand comfortably?  10 min    How long can you walk comfortably?  5-10 min    Diagnostic tests  1. No evidence  of acute infarct on this moderately motion degraded exam.2. Similar size of multiloculated subdural hematohygromas along the bilateral cerebral convexities with trace leftward midline shift and partial effacement of the ventricular system.3. Confluent T2 FLAIR hyperintensity in the dorsal pons is nonspecific but may reflect sequelae of subacute versus chronic insult.4. Multifocal linear susceptibility artifact within the dorsal midbrain and midline pons which may reflect underlying vascular lesion and/or petechial hemorrhage.    Patient Stated Goals  walk by myself without AD for 45 min, be able to go up the stairs to second floor, get back to normal     Currently in Pain?  No/denies         [facility] PT Assessment - 02/08/20 1432      Assessment   Medical Diagnosis  CVA/AML    Referring Provider (PT)  Dr. Levada Schilling    Onset Date/Surgical Date  11/19/19    Hand Dominance  Right    Prior Therapy  Sub-acute 2 weeks      Precautions   Precautions  Fall      Restrictions   Weight Bearing Restrictions  No      Balance Screen   Has the patient fallen in the past 6 months  No    Has the patient had a decrease in activity level because of a fear of falling?   Yes    Is the patient reluctant to leave their home because of a fear of falling?   No      Home Environment   Living Environment  Private residence    Living Arrangements  Spouse/significant other    Available Help at Discharge  Family    Type of [place] to enter    Entrance Stairs-Number of Steps  3    Entrance Stairs-Rails  Right    Home Layout  Two level    Alternate Level Stairs-Number of Steps  Rail on R going up (16 steps)    Alternate Level Stairs-Rails  Right    [place] - 2 wheels;Walker - 4 wheels;Cane - single point;Shower seat;Bedside commode;Grab bars - tub/shower;Other (comment)   bed rail, walk in shower     Prior Function   Level of Independence  Independent      Cognition   Overall Cognitive Status  Within Functional Limits for tasks assessed    Attention  Focused    Focused Attention  Appears intact    Awareness  Appears intact    Problem Solving  Appears intact      ROM / Strength   AROM / PROM / Strength  Strength      Strength   Strength Assessment Site  Hip;Knee;Ankle    Right/Left Hip  Right;Left    Right Hip Flexion  5/5    Right Hip ABduction  5/5    Right Hip ADduction  5/5    Left Hip Flexion  5/5    Left Hip ABduction  5/5    Left Hip ADduction  5/5    Right/Left Knee  Right;Left    Right Knee Flexion  5/5    Right Knee Extension  5/5    Left Knee Flexion  5/5    Left Knee Extension  5/5     Right/Left Ankle  Right;Left    Right Ankle Dorsiflexion  5/5    Left Ankle Dorsiflexion  5/5      Ambulation/Gait   Ambulation/Gait  Yes  Ambulation/Gait Assistance  6: Modified independent (Device/Increase time)    Ambulation Distance (Feet)  115 Feet    Gait Pattern  Ataxic;Wide base of support   deviation from straight line     Standardized Balance Assessment   Standardized Balance Assessment  Berg Balance Test;Five Times Sit to Stand;Dynamic Gait Index    Five times sit to stand comments   28 sec      Berg Balance Test   Sit to Stand  Able to stand using hands after several tries    Standing Unsupported  Able to stand safely 2 minutes    Sitting with Back Unsupported but Feet Supported on Floor or Stool  Able to sit safely and securely 2 minutes    Stand to Sit  Sits safely with minimal use of hands    Transfers  Able to transfer safely, minor use of hands    Standing Unsupported with Eyes Closed  Able to stand 10 seconds safely    Standing Unsupported with Feet Together  Able to place feet together independently and stand 1 minute safely    From Standing, Reach Forward with Outstretched Arm  Can reach confidently >25 cm (10")    From Standing Position, Pick up Object from Floor  Able to pick up shoe safely and easily    From Standing Position, Turn to Look Behind Over each Shoulder  Looks behind from both sides and weight shifts well    Turn 360 Degrees  Able to turn 360 degrees safely but slowly    Standing Unsupported, Alternately Place Feet on Step/Stool  Able to stand independently and safely and complete 8 steps in 20 seconds    Standing Unsupported, One Foot in Front  Able to place foot tandem independently and hold 30 seconds    Standing on One Leg  Able to lift leg independently and hold 5-10 seconds    Total Score  51      Dynamic Gait Index   Level Surface  Mild Impairment    Change in Gait Speed  Mild Impairment    Gait with Horizontal Head Turns  Mild  Impairment    Gait with Vertical Head Turns  Mild Impairment    Gait and Pivot Turn  Mild Impairment    Step Over Obstacle  Normal    Step Around Obstacles  Mild Impairment    Steps  Mild Impairment    Total Score  17             treatment: Stairs: 16 steps one hand rail on Right, CGA, pt educated on performing activity slowly Sit to stand: 10x, No HHA Discussed working on gradual walking program to work up to 45 min a day 5days a week   HEP: Eval: stairs, sit to stand, walking program               PT Education - 02/08/20 1616    Education Details  Pt educated on working on progressive walking at home with his wife with preferred AD (working on walking endurance); going up and down stairs at least one time with alternating gait pattern and use of 1-2 hand rail with wife with him for supervision    Person(s) Educated  Patient;Spouse    Methods  Explanation    Comprehension  Verbalized understanding       PT Short Term Goals - 02/08/20 1617      PT SHORT TERM GOAL #1   Title  Patient will be able  to ambulate 1000' without AD on level ground to improve walking endurance    Baseline  ambulates with RW for 5'    Time  3    Period  Weeks    Status  New    Target Date  02/29/20      PT SHORT TERM GOAL #2   Title  Patient will be able to perform 5 sit to stands under 20 seconds to improve functional strength    Baseline  28 sec (eval)    Time  3    Period  Weeks    Status  New    Target Date  02/29/20      PT SHORT TERM GOAL #3   Title  Patient will be able to able to go up and down stairs with holding 5lb dumbell in one hand and rail on other hand with alternating gait.    Baseline  able to go up/down with one rail and alternating steps    Time  3    Period  Weeks    Status  New    Target Date  02/29/20      PT SHORT TERM GOAL #4   Title  Patient will be able to walk for 20 min with LRAD to improve walking endurance.    Baseline  5-10 min with 2 [employer]     Time  3    Period  Weeks    Status  New    Target Date  02/29/20      PT SHORT TERM GOAL #5   Title  Pt will be able to perform floor transfers with SBA only    Baseline  not attempted    Time  3    Period  Weeks    Status  New    Target Date  02/29/20        PT Long Term Goals - 02/08/20 1659      PT LONG TERM GOAL #1   Title  Pt will be able to ambulate for 45 min without AD to improve walking endurance and return to PLOF    Baseline  10 min with RW (eval)    Time  6    Period  Weeks    Status  New    Target Date  03/21/20      PT LONG TERM GOAL #2   Title  Pt will be able to go up and down stairs with carrying 5 lb box in both hands to improve stair negotiation    Baseline  one rail, alterating steps, doesn't feel safe carrying things (eval)    Period  Weeks    Status  New    Target Date  03/21/20      PT LONG TERM GOAL #3   Title  Pt will demo 22/24 or better on DGI to improve walking balance    Baseline  Eval (17/24)    Time  6    Period  Weeks    Status  New    Target Date  03/21/20            Plan - 02/08/20 1701    Clinical Impression Statement  Patient is a 69 y.o. male who was seen in physical therapy for generalized weakness and gait and balance disorder after hospital stay due to complications of AML and subdural hematoma. Patient demonstrates decreased balance with static and dynamic activity (per Berg balance test and dynamic gait index). Patient demonstrates decreased functional strength per  5x sit to stand test. These impairments are limiting patient from prolonged standing, walking, perforing ADLs, IADLs. Patient will benefit from skilled PT to address these impairments and improve overall function.    Personal Factors and Comorbidities  Comorbidity 3+    Comorbidities  PMHx significant for thrombocytopenia, epistaxis, monocytosis, HLD, MI s/p DES, CMML2, myeloblastic syndrome, AML, subdural hematoma    Examination-Activity Limitations   Carry;Lift;Stand;Stairs;Squat    Examination-Participation Restrictions  Cleaning;Community Activity;Driving;Laundry;Meal Prep;Shop;Yard Work    Stability/Clinical Decision Making  Evolving/Moderate complexity    Clinical Decision Making  Moderate    Rehab Potential  Good    PT Frequency  2x / week    PT Duration  6 weeks    PT Treatment/Interventions  ADLs/Self Care Home Management;Gait training;Stair training;Functional mobility training;Therapeutic activities;Therapeutic exercise;Balance training;Neuromuscular re-education;Manual techniques;Patient/family education    PT Next Visit Plan  6 MWT, work on floor transfers, SLS, Eyes closed balance activities on non compliant surfaces, dynamic gait    PT Home Exercise Plan  stairs once a day, walking 10 min a day, sit to stand without HHA: 10x    Consulted and Agree with Plan of Care  Patient       Patient will benefit from skilled therapeutic intervention in order to improve the following deficits and impairments:  Abnormal gait, Decreased balance, Decreased activity tolerance, Decreased mobility, Decreased endurance, Decreased safety awareness, Decreased strength, Difficulty walking  Visit Diagnosis: Subdural hematoma (HCC)  Unsteadiness on feet  Balance disorder  Muscle weakness (generalized)     Problem List Patient Active Problem List   Diagnosis Date Noted  . Elevated TSH 10/20/2017  . Monocytosis relative  10/20/2017  . Elevated LFTs 12/16/2015  . Testicular nodule vs cyst 12/10/2013  . Visit for preventive health examination 04/29/2012  . Leukopenia 05/14/2011  . Rosacea 05/14/2011  . Unspecified adverse effect of unspecified drug, medicinal and biological substance 10/19/2010  . Thrombocytopenia ([place]) 10/12/2010  . Hyperlipidemia   . HYPOGONADISM 02/05/2010  . Coronary atherosclerosis 06/13/2009  . POSTSURG PERCUT TRANSLUMINAL COR ANGPLSTY STS 06/13/2009  . ERECTILE DYSFUNCTION, MILD 06/29/2008  .  HYPERTRIGLYCERIDEMIA 06/23/2007  . HYPERLIPIDEMIA 06/23/2007  . LOW HDL 06/23/2007    Kerrie Pleasure 02/08/2020, 5:08 PM  [facility] [address] [place], [place], [address] Phone: [phone]   Fax:  [phone]  Name: Jon Carter MRN: [id number] Date of Birth: 14-Feb-1951

## 2020-02-10 DIAGNOSIS — D709 Neutropenia, unspecified: Secondary | ICD-10-CM | POA: Diagnosis not present

## 2020-02-10 DIAGNOSIS — C9200 Acute myeloblastic leukemia, not having achieved remission: Secondary | ICD-10-CM | POA: Diagnosis not present

## 2020-02-10 DIAGNOSIS — L89302 Pressure ulcer of unspecified buttock, stage 2: Secondary | ICD-10-CM | POA: Diagnosis not present

## 2020-02-10 DIAGNOSIS — R5381 Other malaise: Secondary | ICD-10-CM | POA: Diagnosis not present

## 2020-02-10 DIAGNOSIS — Z959 Presence of cardiac and vascular implant and graft, unspecified: Secondary | ICD-10-CM | POA: Diagnosis not present

## 2020-02-11 ENCOUNTER — Ambulatory Visit: Payer: Medicare PPO

## 2020-02-11 ENCOUNTER — Other Ambulatory Visit: Payer: Self-pay

## 2020-02-11 DIAGNOSIS — R2689 Other abnormalities of gait and mobility: Secondary | ICD-10-CM | POA: Diagnosis not present

## 2020-02-11 DIAGNOSIS — R2681 Unsteadiness on feet: Secondary | ICD-10-CM

## 2020-02-11 DIAGNOSIS — M6281 Muscle weakness (generalized): Secondary | ICD-10-CM | POA: Diagnosis not present

## 2020-02-11 DIAGNOSIS — S065XAA Traumatic subdural hemorrhage with loss of consciousness status unknown, initial encounter: Secondary | ICD-10-CM

## 2020-02-11 DIAGNOSIS — R471 Dysarthria and anarthria: Secondary | ICD-10-CM | POA: Diagnosis not present

## 2020-02-11 DIAGNOSIS — S065X9A Traumatic subdural hemorrhage with loss of consciousness of unspecified duration, initial encounter: Secondary | ICD-10-CM | POA: Diagnosis not present

## 2020-02-11 DIAGNOSIS — R209 Unspecified disturbances of skin sensation: Secondary | ICD-10-CM | POA: Diagnosis not present

## 2020-02-11 DIAGNOSIS — R1312 Dysphagia, oropharyngeal phase: Secondary | ICD-10-CM | POA: Diagnosis not present

## 2020-02-11 DIAGNOSIS — R498 Other voice and resonance disorders: Secondary | ICD-10-CM | POA: Diagnosis not present

## 2020-02-11 DIAGNOSIS — R278 Other lack of coordination: Secondary | ICD-10-CM | POA: Diagnosis not present

## 2020-02-11 NOTE — Therapy (Signed)
[facility] [address] [place], [place], [address] Phone: [phone]   Fax:  [phone]  Physical Therapy Treatment  Patient Details  Name: Jon Carter MRN: [id number] Date of Birth: April 16, 1951 Referring Provider (PT): Dr. Levada Schilling   Encounter Date: 02/11/2020   PT End of Session - 02/11/20 1534    Visit Number 2    Number of Visits 13    Date for PT Re-Evaluation 03/21/20    PT Start Time 2992    PT Stop Time 1530    PT Time Calculation (min) 45 min    Equipment Utilized During Treatment Gait belt    Activity Tolerance Patient tolerated treatment well    Behavior During Therapy [facility] for tasks assessed/performed           Past Medical History:  Diagnosis Date  . Adjustment disorder with anxiety 06/13/2009   Qualifier: Diagnosis of  By: Regis Bill MD, [place] Brooking  related to his sudden MI underwent counseling takes low-dose medication as needed had side effects from an SSRI   . Alcohol addiction ([place])   . Hyperlipidemia    Hx of low HDL zero CAC in 2000 at [facility]  . Low testosterone   . STEMI (ST elevation myocardial infarction) ([place]) 04/26/2009   left circumflex DES     Past Surgical History:  Procedure Laterality Date  . CARDIAC CATHETERIZATION  05/02/2009   UA - patent Cfx stent, 50-60% LAD, 50-60% RCA with normal LV funtion (Dr. Adora Fridge)  . CATARACT EXTRACTION Left 2009   [facility]  . CORONARY ANGIOPLASTY WITH STENT PLACEMENT  04/26/2009   ACS/STEMI - total occlusion of mid Cfx - Xience DES 2.75x96mm (Dr. Corky Downs)  . NM MYOCAR PERF WALL MOTION  04/2012   bruce myoview; mild perfusion defect in basal inferior region (infarct/scar or overlying attenuation); post-stress EF 53%, EKG negative for ischemia, hypertensive response to exercise; abnormal study but unchanged from previous study  . TRANSTHORACIC ECHOCARDIOGRAM  05/2009   EF=50-55%; trace MR; mild TR, normal RSVP; AV mildly sclerotic    There  were no vitals filed for this visit.   Subjective Assessment - 02/11/20 1457    Subjective I have been walking 600 feet everyday for about 10 min. Going up and down stairs once a day and practicing sit to stand.    Patient is accompained by: Family member   Wife   Pertinent History PMHx significant for thrombocytopenia, epistaxis, monocytosis, HLD, MI s/p DES, CMML2, myeloblastic syndrome, AML, subdural hematoma    Limitations Standing;Walking;House hold activities    How long can you sit comfortably? no limits    How long can you stand comfortably? 10 min    How long can you walk comfortably? 5-10 min    Diagnostic tests 1. No evidence of acute infarct on this moderately motion degraded exam.2. Similar size of multiloculated subdural hematohygromas along the bilateral cerebral convexities with trace leftward midline shift and partial effacement of the ventricular system.3. Confluent T2 FLAIR hyperintensity in the dorsal pons is nonspecific but may reflect sequelae of subacute versus chronic insult.4. Multifocal linear susceptibility artifact within the dorsal midbrain and midline pons which may reflect underlying vascular lesion and/or petechial hemorrhage.    Patient Stated Goals walk by myself without AD for 45 min, be able to go up the stairs to second floor, get back to normal                Treatment: Practiced walking  normal pace and faster pace: intermittently: 500 feet without AD and CGA Fwd step up: 4" box: 2 x 10 R and L Stepping over 14" hurdle: 10x R and L Modified CTSIB: 30 sec each Sit to stand: 15x nose over toes Bil leg press: 80 lbs 10x Uni leg press: 50 lbs 2 x 5 R and L                      PT Short Term Goals - 02/08/20 1617      PT SHORT TERM GOAL #1   Title Patient will be able to ambulate 1000' without AD on level ground to improve walking endurance    Baseline ambulates with RW for 5'    Time 3    Period Weeks    Status New    Target  Date 02/29/20      PT SHORT TERM GOAL #2   Title Patient will be able to perform 5 sit to stands under 20 seconds to improve functional strength    Baseline 28 sec (eval)    Time 3    Period Weeks    Status New    Target Date 02/29/20      PT SHORT TERM GOAL #3   Title Patient will be able to able to go up and down stairs with holding 5lb dumbell in one hand and rail on other hand with alternating gait.    Baseline able to go up/down with one rail and alternating steps    Time 3    Period Weeks    Status New    Target Date 02/29/20      PT SHORT TERM GOAL #4   Title Patient will be able to walk for 20 min with LRAD to improve walking endurance.    Baseline 5-10 min with 2 [employer]    Time 3    Period Weeks    Status New    Target Date 02/29/20      PT SHORT TERM GOAL #5   Title Pt will be able to perform floor transfers with SBA only    Baseline not attempted    Time 3    Period Weeks    Status New    Target Date 02/29/20             PT Long Term Goals - 02/08/20 1659      PT LONG TERM GOAL #1   Title Pt will be able to ambulate for 45 min without AD to improve walking endurance and return to PLOF    Baseline 10 min with RW (eval)    Time 6    Period Weeks    Status New    Target Date 03/21/20      PT LONG TERM GOAL #2   Title Pt will be able to go up and down stairs with carrying 5 lb box in both hands to improve stair negotiation    Baseline one rail, alterating steps, doesn't feel safe carrying things (eval)    Period Weeks    Status New    Target Date 03/21/20      PT LONG TERM GOAL #3   Title Pt will demo 22/24 or better on DGI to improve walking balance    Baseline Eval (17/24)    Time 6    Period Weeks    Status New    Target Date 03/21/20  Plan - 02/11/20 1534    Clinical Impression Statement Pt tolerated session well. Pt is compliant with HEP. Patient will benefit from continued PT to improve transfers, gait and endurance.      Personal Factors and Comorbidities Comorbidity 3+    Comorbidities PMHx significant for thrombocytopenia, epistaxis, monocytosis, HLD, MI s/p DES, CMML2, myeloblastic syndrome, AML, subdural hematoma    Examination-Activity Limitations Carry;Lift;Stand;Stairs;Squat    Examination-Participation Restrictions Cleaning;Community Activity;Driving;Laundry;Meal Prep;Shop;Yard Work    Stability/Clinical Decision Making Evolving/Moderate complexity    Rehab Potential Good    PT Frequency 2x / week    PT Duration 6 weeks    PT Treatment/Interventions ADLs/Self Care Home Management;Gait training;Stair training;Functional mobility training;Therapeutic activities;Therapeutic exercise;Balance training;Neuromuscular re-education;Manual techniques;Patient/family education    PT Next Visit Plan 6 MWT, work on floor transfers, SLS, Eyes closed balance activities on non compliant surfaces, dynamic gait    PT Home Exercise Plan stairs once a day, walking 10 min a day, sit to stand without HHA: 10x    Consulted and Agree with Plan of Care Patient           Patient will benefit from skilled therapeutic intervention in order to improve the following deficits and impairments:  Abnormal gait, Decreased balance, Decreased activity tolerance, Decreased mobility, Decreased endurance, Decreased safety awareness, Decreased strength, Difficulty walking  Visit Diagnosis: Subdural hematoma (HCC)  Unsteadiness on feet  Balance disorder  Muscle weakness (generalized)     Problem List Patient Active Problem List   Diagnosis Date Noted  . Elevated TSH 10/20/2017  . Monocytosis relative  10/20/2017  . Elevated LFTs 12/16/2015  . Testicular nodule vs cyst 12/10/2013  . Visit for preventive health examination 04/29/2012  . Leukopenia 05/14/2011  . Rosacea 05/14/2011  . Unspecified adverse effect of unspecified drug, medicinal and biological substance 10/19/2010  . Thrombocytopenia ([place]) 10/12/2010  .  Hyperlipidemia   . HYPOGONADISM 02/05/2010  . Coronary atherosclerosis 06/13/2009  . POSTSURG PERCUT TRANSLUMINAL COR ANGPLSTY STS 06/13/2009  . ERECTILE DYSFUNCTION, MILD 06/29/2008  . HYPERTRIGLYCERIDEMIA 06/23/2007  . HYPERLIPIDEMIA 06/23/2007  . LOW HDL 06/23/2007    Kerrie Pleasure 02/11/2020, 3:36 PM  [facility] [address] [place], [place], [address] Phone: [phone]   Fax:  [phone]  Name: Jon Carter MRN: [id number] Date of Birth: 11-16-1950

## 2020-02-14 DIAGNOSIS — Z5111 Encounter for antineoplastic chemotherapy: Secondary | ICD-10-CM | POA: Diagnosis not present

## 2020-02-14 DIAGNOSIS — C9200 Acute myeloblastic leukemia, not having achieved remission: Secondary | ICD-10-CM | POA: Diagnosis not present

## 2020-02-15 ENCOUNTER — Ambulatory Visit: Payer: Medicare PPO | Admitting: Occupational Therapy

## 2020-02-15 DIAGNOSIS — C9200 Acute myeloblastic leukemia, not having achieved remission: Secondary | ICD-10-CM | POA: Diagnosis not present

## 2020-02-15 DIAGNOSIS — Z5111 Encounter for antineoplastic chemotherapy: Secondary | ICD-10-CM | POA: Diagnosis not present

## 2020-02-16 ENCOUNTER — Ambulatory Visit: Payer: Medicare PPO | Admitting: Physical Therapy

## 2020-02-16 ENCOUNTER — Encounter: Payer: Self-pay | Admitting: Physical Therapy

## 2020-02-16 ENCOUNTER — Other Ambulatory Visit: Payer: Self-pay

## 2020-02-16 DIAGNOSIS — M6281 Muscle weakness (generalized): Secondary | ICD-10-CM

## 2020-02-16 DIAGNOSIS — R2681 Unsteadiness on feet: Secondary | ICD-10-CM | POA: Diagnosis not present

## 2020-02-16 DIAGNOSIS — S065X9A Traumatic subdural hemorrhage with loss of consciousness of unspecified duration, initial encounter: Secondary | ICD-10-CM | POA: Diagnosis not present

## 2020-02-16 DIAGNOSIS — R498 Other voice and resonance disorders: Secondary | ICD-10-CM | POA: Diagnosis not present

## 2020-02-16 DIAGNOSIS — R2689 Other abnormalities of gait and mobility: Secondary | ICD-10-CM

## 2020-02-16 DIAGNOSIS — R278 Other lack of coordination: Secondary | ICD-10-CM | POA: Diagnosis not present

## 2020-02-16 DIAGNOSIS — R471 Dysarthria and anarthria: Secondary | ICD-10-CM | POA: Diagnosis not present

## 2020-02-16 DIAGNOSIS — R209 Unspecified disturbances of skin sensation: Secondary | ICD-10-CM | POA: Diagnosis not present

## 2020-02-16 DIAGNOSIS — Z5111 Encounter for antineoplastic chemotherapy: Secondary | ICD-10-CM | POA: Diagnosis not present

## 2020-02-16 DIAGNOSIS — S065XAA Traumatic subdural hemorrhage with loss of consciousness status unknown, initial encounter: Secondary | ICD-10-CM

## 2020-02-16 DIAGNOSIS — C9200 Acute myeloblastic leukemia, not having achieved remission: Secondary | ICD-10-CM | POA: Diagnosis not present

## 2020-02-16 DIAGNOSIS — R1312 Dysphagia, oropharyngeal phase: Secondary | ICD-10-CM | POA: Diagnosis not present

## 2020-02-17 ENCOUNTER — Encounter: Payer: Self-pay | Admitting: Occupational Therapy

## 2020-02-17 ENCOUNTER — Ambulatory Visit: Payer: Medicare PPO | Admitting: Occupational Therapy

## 2020-02-17 DIAGNOSIS — R2681 Unsteadiness on feet: Secondary | ICD-10-CM | POA: Diagnosis not present

## 2020-02-17 DIAGNOSIS — R209 Unspecified disturbances of skin sensation: Secondary | ICD-10-CM | POA: Diagnosis not present

## 2020-02-17 DIAGNOSIS — R1312 Dysphagia, oropharyngeal phase: Secondary | ICD-10-CM | POA: Diagnosis not present

## 2020-02-17 DIAGNOSIS — R278 Other lack of coordination: Secondary | ICD-10-CM

## 2020-02-17 DIAGNOSIS — R471 Dysarthria and anarthria: Secondary | ICD-10-CM | POA: Diagnosis not present

## 2020-02-17 DIAGNOSIS — M6281 Muscle weakness (generalized): Secondary | ICD-10-CM | POA: Diagnosis not present

## 2020-02-17 DIAGNOSIS — R498 Other voice and resonance disorders: Secondary | ICD-10-CM | POA: Diagnosis not present

## 2020-02-17 DIAGNOSIS — Z5111 Encounter for antineoplastic chemotherapy: Secondary | ICD-10-CM | POA: Diagnosis not present

## 2020-02-17 DIAGNOSIS — R208 Other disturbances of skin sensation: Secondary | ICD-10-CM

## 2020-02-17 DIAGNOSIS — S065X9A Traumatic subdural hemorrhage with loss of consciousness of unspecified duration, initial encounter: Secondary | ICD-10-CM | POA: Diagnosis not present

## 2020-02-17 DIAGNOSIS — C9200 Acute myeloblastic leukemia, not having achieved remission: Secondary | ICD-10-CM | POA: Diagnosis not present

## 2020-02-17 DIAGNOSIS — R2689 Other abnormalities of gait and mobility: Secondary | ICD-10-CM | POA: Diagnosis not present

## 2020-02-17 NOTE — Therapy (Signed)
[facility] [address] [place], [place], [address] Phone: [phone]   Fax:  [phone]  Physical Therapy Treatment  Patient Details  Name: Jon Carter MRN: [id number] Date of Birth: 01/22/1951 Referring Provider (PT): Dr. Levada Schilling   Encounter Date: 02/16/2020   PT End of Session - 02/16/20 1534    Visit Number 3    Number of Visits 13    Date for PT Re-Evaluation 03/21/20    PT Start Time 1532    PT Stop Time 1615    PT Time Calculation (min) 43 min    Equipment Utilized During Treatment Gait belt    Activity Tolerance Patient tolerated treatment well    Behavior During Therapy [facility] for tasks assessed/performed           Past Medical History:  Diagnosis Date  . Adjustment disorder with anxiety 06/13/2009   Qualifier: Diagnosis of  By: Regis Bill MD, Standley Brooking  related to his sudden MI underwent counseling takes low-dose medication as needed had side effects from an SSRI   . Alcohol addiction ([place])   . Hyperlipidemia    Hx of low HDL zero CAC in 2000 at [place]  . Low testosterone   . STEMI (ST elevation myocardial infarction) ([place]) 04/26/2009   left circumflex DES     Past Surgical History:  Procedure Laterality Date  . CARDIAC CATHETERIZATION  05/02/2009   UA - patent Cfx stent, 50-60% LAD, 50-60% RCA with normal LV funtion (Dr. Adora Fridge)  . CATARACT EXTRACTION Left 2009   [place]  . CORONARY ANGIOPLASTY WITH STENT PLACEMENT  04/26/2009   ACS/STEMI - total occlusion of mid Cfx - Xience DES 2.75x43mm (Dr. Corky Downs)  . NM MYOCAR PERF WALL MOTION  04/2012   bruce myoview; mild perfusion defect in basal inferior region (infarct/scar or overlying attenuation); post-stress EF 53%, EKG negative for ischemia, hypertensive response to exercise; abnormal study but unchanged from previous study  . TRANSTHORACIC ECHOCARDIOGRAM  05/2009   EF=50-55%; trace MR; mild TR, normal RSVP; AV mildly sclerotic    There  were no vitals filed for this visit.   Subjective Assessment - 02/16/20 1532    Subjective Sarted Chemo on Monday, both IV and oral, daily till Monday. No falls or pain to report.    Pertinent History PMHx significant for thrombocytopenia, epistaxis, monocytosis, HLD, MI s/p DES, CMML2, myeloblastic syndrome, AML, subdural hematoma    Limitations Standing;Walking;House hold activities    How long can you sit comfortably? no limits    How long can you stand comfortably? 10 min    How long can you walk comfortably? 5-10 min    Diagnostic tests 1. No evidence of acute infarct on this moderately motion degraded exam.2. Similar size of multiloculated subdural hematohygromas along the bilateral cerebral convexities with trace leftward midline shift and partial effacement of the ventricular system.3. Confluent T2 FLAIR hyperintensity in the dorsal pons is nonspecific but may reflect sequelae of subacute versus chronic insult.4. Multifocal linear susceptibility artifact within the dorsal midbrain and midline pons which may reflect underlying vascular lesion and/or petechial hemorrhage.    Patient Stated Goals walk by myself without AD for 45 min, be able to go up the stairs to second floor, get back to normal    Currently in Pain? No/denies              [facility] PT Assessment - 02/16/20 1535      6 Minute Walk- Baseline  6 Minute Walk- Baseline yes    BP (mmHg) 111/69    HR (bpm) 90    02 Sat (%RA) 100 %    Modified Borg Scale for Dyspnea 0- Nothing at all    Perceived Rate of Exertion (Borg) 6-      6 Minute walk- Post Test   6 Minute Walk Post Test yes    BP (mmHg) 107/69    HR (bpm) 91    02 Sat (%RA) 99 %    Modified Borg Scale for Dyspnea 0- Nothing at all    Perceived Rate of Exertion (Borg) 6-      6 minute walk test results    Aerobic Endurance Distance Walked 998   use of RW with no rest breaks needed   Endurance additional comments with RW, no rest breaks taken                [facility] Adult PT Treatment/Exercise - 02/16/20 1550      Transfers   Transfers Sit to Stand;Stand to Sit    Sit to Stand 5: Supervision;With upper extremity assist;Without upper extremity assist;From bed;From chair/3-in-1    Stand to Sit 5: Supervision;With upper extremity assist;Without upper extremity assist;To bed;To chair/3-in-1      Ambulation/Gait   Ambulation/Gait Yes    Ambulation/Gait Assistance 5: Supervision;6: Modified independent (Device/Increase time)    Ambulation/Gait Assistance Details use of RW with 6 minute walk test at Mod I level. use on no AD with remainder of session at supervision level.    Assistive device None;Rolling walker    Gait Pattern Step-through pattern;Decreased stride length;Trunk flexed    Ambulation Surface Level;Indoor               Balance Exercises - 02/16/20 2214      Balance Exercises: Standing   Standing Eyes Closed Narrow base of support (BOS);Wide (BOA);Head turns;Foam/compliant surface;Solid surface;Other reps (comment);30 secs;Limitations    Standing Eyes Closed Limitations progressed from floor to on pillows in corner- narrow BOS no head movements, progressing to wide BOS for head movements left<>right, up<>down. min guard assist to min assist on compliant surfaces. cues on posture and weight shifting for balance.     SLS with Vectors Solid surface;Foam/compliant surface;Other reps (comment);Limitations    SLS with Vectors Limitations progressed from floor > standing on 1 inch foam with foam bubbles: alternating fwd foot taps, then alternating cross foot taps for 8-10 reps each on each surface. cues needed to maintain wider BOS, for weight shiftng and posture to assist with balance. min guard to min assist needed.    Partial Tandem Stance Eyes closed;Intermittent upper extremity support;3 reps;30 secs;Limitations    Partial Tandem Stance Limitations on floor 3 reps each foot fwd with min assist and occasional UE support/touch to  walls/chair back for balance. cues on posture to assist with balance recovery               PT Short Term Goals - 02/08/20 1617      PT SHORT TERM GOAL #1   Title Patient will be able to ambulate 1000' without AD on level ground to improve walking endurance    Baseline ambulates with RW for 5'    Time 3    Period Weeks    Status New    Target Date 02/29/20      PT SHORT TERM GOAL #2   Title Patient will be able to perform 5 sit to stands under 20 seconds to improve functional  strength    Baseline 28 sec (eval)    Time 3    Period Weeks    Status New    Target Date 02/29/20      PT SHORT TERM GOAL #3   Title Patient will be able to able to go up and down stairs with holding 5lb dumbell in one hand and rail on other hand with alternating gait.    Baseline able to go up/down with one rail and alternating steps    Time 3    Period Weeks    Status New    Target Date 02/29/20      PT SHORT TERM GOAL #4   Title Patient will be able to walk for 20 min with LRAD to improve walking endurance.    Baseline 5-10 min with 2 [employer]    Time 3    Period Weeks    Status New    Target Date 02/29/20      PT SHORT TERM GOAL #5   Title Pt will be able to perform floor transfers with SBA only    Baseline not attempted    Time 3    Period Weeks    Status New    Target Date 02/29/20             PT Long Term Goals - 02/08/20 1659      PT LONG TERM GOAL #1   Title Pt will be able to ambulate for 45 min without AD to improve walking endurance and return to PLOF    Baseline 10 min with RW (eval)    Time 6    Period Weeks    Status New    Target Date 03/21/20      PT LONG TERM GOAL #2   Title Pt will be able to go up and down stairs with carrying 5 lb box in both hands to improve stair negotiation    Baseline one rail, alterating steps, doesn't feel safe carrying things (eval)    Period Weeks    Status New    Target Date 03/21/20      PT LONG TERM GOAL #3   Title Pt will demo  22/24 or better on DGI to improve walking balance    Baseline Eval (17/24)    Time 6    Period Weeks    Status New    Target Date 03/21/20                 Plan - 02/16/20 1534    Clinical Impression Statement Today's skilled session initially focused on establishing baseline value for 6 minute walk test. Remainder of session focused on balance reactions. Pt demo'd posterior balance loss preference, increased when on complaint sufaces. The pt is making steady progress toward goals and should benefit from continued PT to progress toward unmet goals.    Personal Factors and Comorbidities Comorbidity 3+    Comorbidities PMHx significant for thrombocytopenia, epistaxis, monocytosis, HLD, MI s/p DES, CMML2, myeloblastic syndrome, AML, subdural hematoma    Examination-Activity Limitations Carry;Lift;Stand;Stairs;Squat    Examination-Participation Restrictions Cleaning;Community Activity;Driving;Laundry;Meal Prep;Shop;Yard Work    Stability/Clinical Decision Making Evolving/Moderate complexity    Rehab Potential Good    PT Frequency 2x / week    PT Duration 6 weeks    PT Treatment/Interventions ADLs/Self Care Home Management;Gait training;Stair training;Functional mobility training;Therapeutic activities;Therapeutic exercise;Balance training;Neuromuscular re-education;Manual techniques;Patient/family education    PT Next Visit Plan work on floor transfers, SLS, Eyes closed balance activities on compliant surfaces,  dynamic gait without AD    PT Home Exercise Plan stairs once a day, walking 10 min a day, sit to stand without HHA: 10x    Consulted and Agree with Plan of Care Patient           Patient will benefit from skilled therapeutic intervention in order to improve the following deficits and impairments:  Abnormal gait, Decreased balance, Decreased activity tolerance, Decreased mobility, Decreased endurance, Decreased safety awareness, Decreased strength, Difficulty walking  Visit  Diagnosis: Subdural hematoma (HCC)  Unsteadiness on feet  Balance disorder  Muscle weakness (generalized)     Problem List Patient Active Problem List   Diagnosis Date Noted  . Elevated TSH 10/20/2017  . Monocytosis relative  10/20/2017  . Elevated LFTs 12/16/2015  . Testicular nodule vs cyst 12/10/2013  . Visit for preventive health examination 04/29/2012  . Leukopenia 05/14/2011  . Rosacea 05/14/2011  . Unspecified adverse effect of unspecified drug, medicinal and biological substance 10/19/2010  . Thrombocytopenia ([place]) 10/12/2010  . Hyperlipidemia   . HYPOGONADISM 02/05/2010  . Coronary atherosclerosis 06/13/2009  . POSTSURG PERCUT TRANSLUMINAL COR ANGPLSTY STS 06/13/2009  . ERECTILE DYSFUNCTION, MILD 06/29/2008  . HYPERTRIGLYCERIDEMIA 06/23/2007  . HYPERLIPIDEMIA 06/23/2007  . LOW HDL 06/23/2007    Willow Ora, PTA, [facility] Outpatient Neuro [facility] [address], [place],  [address] [phone] 02/17/20, 10:24 PM   Name: NAKUL AVINO MRN: [id number] Date of Birth: 10-23-1950

## 2020-02-17 NOTE — Therapy (Signed)
[facility] [address] [place], [place], [address] Phone: [phone]   Fax:  [phone]  Occupational Therapy Evaluation  Patient Details  Name: Jon Carter MRN: [id number] Date of Birth: 05/26/1951 Referring Provider (OT): Levada Schilling   Encounter Date: 02/17/2020   OT End of Session - 02/17/20 1552    Visit Number 1    Number of Visits 9    Date for OT Re-Evaluation 04/02/20    Authorization Type [employer] and [employer]    Progress Note Due on Visit 10    OT Start Time 1450    OT Stop Time 1532    OT Time Calculation (min) 42 min    Activity Tolerance Patient tolerated treatment well    Behavior During Therapy [facility] for tasks assessed/performed           Past Medical History:  Diagnosis Date  . Adjustment disorder with anxiety 06/13/2009   Qualifier: Diagnosis of  By: Regis Bill MD, [place] Brooking  related to his sudden MI underwent counseling takes low-dose medication as needed had side effects from an SSRI   . Alcohol addiction ([place])   . Hyperlipidemia    Hx of low HDL zero CAC in 2000 at [place]  . Low testosterone   . STEMI (ST elevation myocardial infarction) ([place]) 04/26/2009   left circumflex DES     Past Surgical History:  Procedure Laterality Date  . CARDIAC CATHETERIZATION  05/02/2009   UA - patent Cfx stent, 50-60% LAD, 50-60% RCA with normal LV funtion (Dr. Adora Fridge)  . CATARACT EXTRACTION Left 2009   [place]  . CORONARY ANGIOPLASTY WITH STENT PLACEMENT  04/26/2009   ACS/STEMI - total occlusion of mid Cfx - Xience DES 2.75x52mm (Dr. Corky Downs)  . NM MYOCAR PERF WALL MOTION  04/2012   bruce myoview; mild perfusion defect in basal inferior region (infarct/scar or overlying attenuation); post-stress EF 53%, EKG negative for ischemia, hypertensive response to exercise; abnormal study but unchanged from previous study  . TRANSTHORACIC ECHOCARDIOGRAM  05/2009   EF=50-55%; trace MR; mild TR, normal  RSVP; AV mildly sclerotic    There were no vitals filed for this visit.   Subjective Assessment - 02/17/20 1458    Subjective  To be able to manipulate small things - left hand is weaker    Patient is accompanied by: Family member    Currently in Pain? No/denies             [facility] OT Assessment - 02/17/20 0001      Assessment   Medical Diagnosis Subdural Hematoma    Referring Provider (OT) Levada Schilling    Onset Date/Surgical Date 11/19/19    Hand Dominance Right    Prior Therapy Inpatient rehab at [facility]      Precautions   Precautions Fall    Precaution Comments Currently walking with walker      Restrictions   Weight Bearing Restrictions No      Balance Screen   Has the patient fallen in the past 6 months No      Prior Function   Level of Independence Independent with basic ADLs;Independent with community mobility without device;Independent with homemaking with ambulation    Vocation Retired    Leisure working out at gym, yoga, [employer], walking, gardening, going out with wife, listening to [employer]      ADL   Eating/Feeding Independent    Grooming Independent    Upper Body Bathing Minimal assistance  Lower Body Bathing Minimal assistance    Upper Body Dressing Increased time;Needs assist for fasteners    Lower Body Dressing Increased time;Minimal assistance    Toilet Transfer Modified independent    Tub/Shower Transfer Modified independent    Archivist seat with back    ADL comments Has a shower seat, BSC over commode, and rolling walker      IADL   Prior Level of Function Shopping Independent    Prior Level of Function Light Housekeeping Independent    Prior Level of Function Meal Prep Independent    Prior Level of Function Scientist, research (physical sciences) Relies on family or friends for transportation    Prior Level of Function Medication Managment Independent   only took cholesterol medicine   Medication  Management Has difficulty remembering to take medication    Prior Level of Function Financial Management Independent      Written Expression   Dominant Hand Right    Handwriting 100% legible   Reports mild change to quality and small size      Vision - History   Baseline Vision --   had lasix surgery, no corrective lenses   Visual History Corrective eye surgery    Additional Comments No corrective lenses      Vision Assessment   Eye Alignment Within Functional Limits    Ocular Range of Motion Within Functional Limits    Alignment/Gaze Preference Within Defined Limits    Tracking/Visual Pursuits Able to track stimulus in all quads without difficulty      Activity Tolerance   Activity Tolerance --   Takes nap daily, was active prior - currently on chemo   Activity Tolerance Comments Patient currently on chemotherapy, and displays general fatigue, disinterest in activities he used to enjoy, e.g. reading      Cognition   Overall Cognitive Status Impaired/Different from baseline    Attention Alternating    Awareness Appears intact    Problem Solving Appears intact    Cognition Comments May be mild delay to processing, some earlier memory deficits reported, not apparent during OT assessment today      Posture/Postural Control   Posture/Postural Control No significant limitations      Sensation   Light Touch Impaired by gross assessment   numbness fingertips   Stereognosis Appears Intact      Coordination   Fine Motor Movements are Fluid and Coordinated No    9 Hole Peg Test Right;Left    Right 9 Hole Peg Test 34.72    Left 9 Hole Peg Test 37.19    Coordination Numbness in fingertips      Perception   Perception Within Functional Limits      Praxis   Praxis Intact      ROM / Strength   AROM / PROM / Strength AROM;Strength      AROM   Overall AROM  Deficits    AROM Assessment Site Shoulder    Right/Left Shoulder Right;Left    Right Shoulder Flexion 110 Degrees    Left  Shoulder Flexion 105 Degrees      Strength   Overall Strength Within functional limits for tasks performed    Overall Strength Comments BUE - 4 TO 4+/5      Hand Function   Right Hand Gross Grasp Impaired    Right Hand Grip (lbs) 18.9    Right Hand Lateral Pinch 8 lbs    Right Hand 3 Point Pinch 9  lbs    Left Hand Gross Grasp Impaired    Left Hand Grip (lbs) 15.6    Left Hand Lateral Pinch 9 lbs    Left 3 point pinch 5 lbs                           OT Education - 02/17/20 1551    Education Details Assessment findings and potential OT plan of care    Person(s) Educated Patient;Spouse    Methods Explanation    Comprehension Verbalized understanding               OT Long Term Goals - 02/17/20 1559      OT LONG TERM GOAL #1   Title Patient will independently complete a home exercise program designed to improve coordination in BUE    Time 4    Period Weeks    Status New    Target Date 04/02/20      OT LONG TERM GOAL #2   Title Patient will independently complete a home exercise program designed to improve grip and pinch strength in BUE    Time 4    Period Weeks    Status New      OT LONG TERM GOAL #3   Title Patient will demonstrate active range of motion to at least 120* shoulder flexion bilaterally to aide with reaching to obtain lightweight object from overhead shelf    Baseline 105 left, 110 right    Time 4    Period Weeks    Status New      OT LONG TERM GOAL #4   Title Patient will demonstrate an 8-10 lb increase in grip strength in BUE to assist with opening new food packages, or bottles    Time 4    Period Weeks    Status New      OT LONG TERM GOAL #5   Title Patient will complete bathing and dressing with modified independence    Time 4    Period Weeks    Status New      Long Term Additional Goals   Additional Long Term Goals Yes      OT LONG TERM GOAL #6   Title Patient will demonstrate understanding of driving recommendations     Time 4    Period Weeks    Status New      OT LONG TERM GOAL #7   Title Patient will return to community exercise program with close supervision    Time 4    Period Weeks    Status New                 Plan - 02/17/20 1553    Clinical Impression Statement Patient is a 69 year old previously health male recently discharged after prolonged and complicated hospitalization from [facility] rehab with acute myeloid leukemia, acute respiratory failure, and subdural hemorrhage.  Patient is currently undergoing chemotherapy both via IV and oral agents.  Patient requires assistance for some basic self care skills due to decreased activity tolerance, decreased balance, generalized weakness, decreased range of motion in Bilateral shoulders, and numbness in both hands.  Patient will benefit from skilled OT intervention to increase independence with ADL, and return participation with IADL's.    OT Occupational Profile and History Detailed Assessment- Review of Records and additional review of physical, cognitive, psychosocial history related to current functional performance    Occupational performance deficits (Please refer to evaluation  for details): ADL's;IADL's;Leisure;Social Participation    Body Structure / Function / Physical Skills ADL;Coordination;Endurance;GMC;UE functional use;Balance;Decreased knowledge of precautions;Sensation;IADL;Flexibility;Dexterity;FMC;Strength;ROM    Rehab Potential Good    Clinical Decision Making Several treatment options, min-mod task modification necessary    Comorbidities Affecting Occupational Performance: May have comorbidities impacting occupational performance    Modification or Assistance to Complete Evaluation  Min-Moderate modification of tasks or assist with assess necessary to complete eval    OT Frequency 2x / week    OT Duration 4 weeks    OT Treatment/Interventions Self-care/ADL training;Therapeutic exercise;Patient/family  education;Neuromuscular education;Therapist, nutritional;Therapeutic activities;Balance training;Manual Therapy;DME and/or AE instruction           Patient will benefit from skilled therapeutic intervention in order to improve the following deficits and impairments:   Body Structure / Function / Physical Skills: ADL, Coordination, Endurance, GMC, UE functional use, Balance, Decreased knowledge of precautions, Sensation, IADL, Flexibility, Dexterity, FMC, Strength, ROM       Visit Diagnosis: Other lack of coordination - Plan: Ot plan of care cert/re-cert  Muscle weakness (generalized) - Plan: Ot plan of care cert/re-cert  Other disturbances of skin sensation - Plan: Ot plan of care cert/re-cert  Unsteadiness on feet - Plan: Ot plan of care cert/re-cert    Problem List Patient Active Problem List   Diagnosis Date Noted  . Elevated TSH 10/20/2017  . Monocytosis relative  10/20/2017  . Elevated LFTs 12/16/2015  . Testicular nodule vs cyst 12/10/2013  . Visit for preventive health examination 04/29/2012  . Leukopenia 05/14/2011  . Rosacea 05/14/2011  . Unspecified adverse effect of unspecified drug, medicinal and biological substance 10/19/2010  . Thrombocytopenia ([place]) 10/12/2010  . Hyperlipidemia   . HYPOGONADISM 02/05/2010  . Coronary atherosclerosis 06/13/2009  . POSTSURG PERCUT TRANSLUMINAL COR ANGPLSTY STS 06/13/2009  . ERECTILE DYSFUNCTION, MILD 06/29/2008  . HYPERTRIGLYCERIDEMIA 06/23/2007  . HYPERLIPIDEMIA 06/23/2007  . LOW HDL 06/23/2007    Mariah Milling, OTR/L 02/17/2020, 4:08 PM  [facility] [address] [place], [place], [address] Phone: [phone]   Fax:  [phone]  Name: BROWNING SOUTHWOOD MRN: [id number] Date of Birth: [birth year]

## 2020-02-18 ENCOUNTER — Other Ambulatory Visit: Payer: Self-pay

## 2020-02-18 ENCOUNTER — Encounter (HOSPITAL_COMMUNITY): Payer: Self-pay

## 2020-02-18 ENCOUNTER — Emergency Department (HOSPITAL_COMMUNITY)
Admission: EM | Admit: 2020-02-18 | Discharge: 2020-02-18 | Disposition: A | Discharge status: Home / Self Care | Payer: Medicare PPO | Attending: Emergency Medicine | Admitting: Emergency Medicine

## 2020-02-18 ENCOUNTER — Ambulatory Visit: Payer: Medicare PPO

## 2020-02-18 ENCOUNTER — Inpatient Hospital Stay: Payer: Medicare PPO | Admitting: Internal Medicine

## 2020-02-18 DIAGNOSIS — R2681 Unsteadiness on feet: Secondary | ICD-10-CM | POA: Diagnosis not present

## 2020-02-18 DIAGNOSIS — R2689 Other abnormalities of gait and mobility: Secondary | ICD-10-CM | POA: Diagnosis not present

## 2020-02-18 DIAGNOSIS — R42 Dizziness and giddiness: Secondary | ICD-10-CM | POA: Diagnosis not present

## 2020-02-18 DIAGNOSIS — R4020 Unspecified coma: Secondary | ICD-10-CM | POA: Diagnosis not present

## 2020-02-18 DIAGNOSIS — R209 Unspecified disturbances of skin sensation: Secondary | ICD-10-CM | POA: Diagnosis not present

## 2020-02-18 DIAGNOSIS — R55 Syncope and collapse: Secondary | ICD-10-CM | POA: Diagnosis not present

## 2020-02-18 DIAGNOSIS — M6281 Muscle weakness (generalized): Secondary | ICD-10-CM | POA: Diagnosis not present

## 2020-02-18 DIAGNOSIS — R471 Dysarthria and anarthria: Secondary | ICD-10-CM | POA: Diagnosis not present

## 2020-02-18 DIAGNOSIS — R278 Other lack of coordination: Secondary | ICD-10-CM | POA: Diagnosis not present

## 2020-02-18 DIAGNOSIS — Z5111 Encounter for antineoplastic chemotherapy: Secondary | ICD-10-CM | POA: Diagnosis not present

## 2020-02-18 DIAGNOSIS — Z7982 Long term (current) use of aspirin: Secondary | ICD-10-CM | POA: Insufficient documentation

## 2020-02-18 DIAGNOSIS — R1312 Dysphagia, oropharyngeal phase: Secondary | ICD-10-CM | POA: Diagnosis not present

## 2020-02-18 DIAGNOSIS — Z79899 Other long term (current) drug therapy: Secondary | ICD-10-CM | POA: Diagnosis not present

## 2020-02-18 DIAGNOSIS — C9200 Acute myeloblastic leukemia, not having achieved remission: Secondary | ICD-10-CM | POA: Diagnosis not present

## 2020-02-18 DIAGNOSIS — R498 Other voice and resonance disorders: Secondary | ICD-10-CM | POA: Diagnosis not present

## 2020-02-18 DIAGNOSIS — S065X9A Traumatic subdural hemorrhage with loss of consciousness of unspecified duration, initial encounter: Secondary | ICD-10-CM | POA: Diagnosis not present

## 2020-02-18 DIAGNOSIS — I491 Atrial premature depolarization: Secondary | ICD-10-CM | POA: Diagnosis not present

## 2020-02-18 DIAGNOSIS — I2510 Atherosclerotic heart disease of native coronary artery without angina pectoris: Secondary | ICD-10-CM | POA: Insufficient documentation

## 2020-02-18 DIAGNOSIS — R000 Tachycardia, unspecified: Secondary | ICD-10-CM | POA: Diagnosis not present

## 2020-02-18 LAB — I-STAT CHEM 8, ED
BUN: 11 mg/dL (ref 8–23)
Calcium, Ion: 1.3 mmol/L (ref 1.15–1.40)
Chloride: 105 mmol/L (ref 98–111)
Creatinine, Ser: 0.7 mg/dL (ref 0.61–1.24)
Glucose, Bld: 110 mg/dL — ABNORMAL HIGH (ref 70–99)
HCT: 31 % — ABNORMAL LOW (ref 39.0–52.0)
Hemoglobin: 10.5 g/dL — ABNORMAL LOW (ref 13.0–17.0)
Potassium: 3.5 mmol/L (ref 3.5–5.1)
Sodium: 142 mmol/L (ref 135–145)
TCO2: 25 mmol/L (ref 22–32)

## 2020-02-18 NOTE — Discharge Instructions (Addendum)
Follow-up with your treatment today as planned.  Return to the ED as needed for worsening symptoms.

## 2020-02-18 NOTE — ED Notes (Signed)
Patient verbalizes understanding of discharge instructions. Opportunity for questioning and answers were provided. Pt discharged from ED. 

## 2020-02-18 NOTE — ED Triage Notes (Signed)
Pt from [facility] via ems; had a syncopal episode this am while doing physical therapy; got up to go to restroom and had syncopal episode; unsure if hit head; pt not on thinners; BP 19R systolic on ems arrival; cancer pt, PICC in L arm; denies pain; pt a and o x 4 on arrival  103/71 100% RA CBG 140 RR 16 HR 80 NSR

## 2020-02-18 NOTE — ED Provider Notes (Signed)
[facility] EMERGENCY DEPARTMENT Provider Note   CSN: [id number] Arrival date & time: 02/18/20  1154     History Chief Complaint  Patient presents with  . Loss of Consciousness    Jon Carter is a 69 y.o. adult.  HPI   Patient presented to the ED for evaluation after a syncopal event.  Patient has history of leukemia.  He is getting treatments at [facility].  Patient states he is getting physical therapy and just started a couple weeks ago.  Today he was doing physical therapy exercises where he felt he pushed himself harder than usual.  Patient started to get lightheaded.  Became somewhat nauseated.  He felt hot and flushed.  Patient felt like he had to sit down.  Patient states after about 5 minutes he started to feel better.  By that time the staff at the facility had already called EMS.  Patient did not really want to come.  He denies chest pain or shortness of breath.  No fevers or chills.  Has not noticed any bleeding.  Past Medical History:  Diagnosis Date  . Adjustment disorder with anxiety 06/13/2009   Qualifier: Diagnosis of  By: Regis Bill MD, Standley Brooking  related to his sudden MI underwent counseling takes low-dose medication as needed had side effects from an SSRI   . Alcohol addiction ([facility])   . Hyperlipidemia    Hx of low HDL zero CAC in 2000 at [place]  . Low testosterone   . STEMI (ST elevation myocardial infarction) ([place]) 04/26/2009   left circumflex DES     Patient Active Problem List   Diagnosis Date Noted  . Elevated TSH 10/20/2017  . Monocytosis relative  10/20/2017  . Elevated LFTs 12/16/2015  . Testicular nodule vs cyst 12/10/2013  . Visit for preventive health examination 04/29/2012  . Leukopenia 05/14/2011  . Rosacea 05/14/2011  . Unspecified adverse effect of unspecified drug, medicinal and biological substance 10/19/2010  . Thrombocytopenia ([place]) 10/12/2010  . Hyperlipidemia   . HYPOGONADISM 02/05/2010  . Coronary  atherosclerosis 06/13/2009  . POSTSURG PERCUT TRANSLUMINAL COR ANGPLSTY STS 06/13/2009  . ERECTILE DYSFUNCTION, MILD 06/29/2008  . HYPERTRIGLYCERIDEMIA 06/23/2007  . HYPERLIPIDEMIA 06/23/2007  . LOW HDL 06/23/2007    Past Surgical History:  Procedure Laterality Date  . CARDIAC CATHETERIZATION  05/02/2009   UA - patent Cfx stent, 50-60% LAD, 50-60% RCA with normal LV funtion (Dr. Adora Fridge)  . CATARACT EXTRACTION Left 2009   [place]  . CORONARY ANGIOPLASTY WITH STENT PLACEMENT  04/26/2009   ACS/STEMI - total occlusion of mid Cfx - Xience DES 2.75x12mm (Dr. Corky Downs)  . NM MYOCAR PERF WALL MOTION  04/2012   bruce myoview; mild perfusion defect in basal inferior region (infarct/scar or overlying attenuation); post-stress EF 53%, EKG negative for ischemia, hypertensive response to exercise; abnormal study but unchanged from previous study  . TRANSTHORACIC ECHOCARDIOGRAM  05/2009   EF=50-55%; trace MR; mild TR, normal RSVP; AV mildly sclerotic     OB History   No obstetric history on file.     Family History  Problem Relation Age of Onset  . Tongue cancer Father        tobacco  . Throat cancer Father   . Cirrhosis Father        also heart problems from etoh   . Hypertension Mother   . Heart Problems Other        mom  in 23s     Social History  Tobacco Use  . Smoking status: Never Smoker  . Smokeless tobacco: Never Used  Vaping Use  . Vaping Use: Never used  Substance Use Topics  . Alcohol use: Yes    Alcohol/week: 2.0 standard drinks    Types: 2 Standard drinks or equivalent per week  . Drug use: No    Home Medications Prior to Admission medications   Medication Sig Start Date End Date Taking? Authorizing Provider  ascorbic acid ([employer] VITAMIN C) 500 MG tablet Take 500 mg by mouth daily.    [provider]  aspirin 81 MG tablet Take 81 mg by mouth daily.      [provider]  cholecalciferol (VITAMIN D3) 25 MCG (1000 UNIT) tablet Take 5,000 Units  by mouth daily.    [provider]  clonazePAM (KLONOPIN) 1 MG tablet Take 0.5-1 tablets (0.5-1 mg total) by mouth 2 (two) times daily as needed for anxiety. Patient not taking: Reported on 11/19/2019 08/20/19   Laurey Morale, MD  Coenzyme Q10 150 MG CAPS Take 150 mg by mouth daily.     [provider]  Colchicine 0.6 MG CAPS Take 1 capsule by mouth 2 (two) times daily. Patient not taking: Reported on 11/19/2019 10/29/18   Isaac Bliss, Rayford Halsted, MD  docusate sodium (COLACE) 100 MG capsule Take 100 mg by mouth 2 (two) times daily.    [provider]  Multiple Vitamins-Minerals (ZINC PO) Take 1 tablet by mouth daily.    [provider]  nitroGLYCERIN (NITROSTAT) 0.4 MG SL tablet Place 1 tablet (0.4 mg total) under the tongue every 5 (five) minutes as needed. 11/15/19   Panosh, Standley Brooking, MD  rosuvastatin (CRESTOR) 20 MG tablet Take 1 tablet (20 mg total) by mouth daily. 08/30/19   Troy Sine, MD  valACYclovir (VALTREX) 1000 MG tablet TAKE 2 TABLETS(2000 MG) BY MOUTH TWICE DAILY AS NEEDED FOR COLD SORES 11/03/19   Panosh, Standley Brooking, MD    Allergies    Patient has no known allergies.  Review of Systems   Review of Systems  All other systems reviewed and are negative.   Physical Exam Updated Vital Signs BP 114/75 (BP Location: Right Arm)   Pulse 83   Temp 98.3 F (36.8 C) (Oral)   Resp 14   Ht 1.816 m (5' 11.5")   Wt 76.7 kg   SpO2 100%   BMI 23.24 kg/m   Physical Exam Vitals and nursing note reviewed.  Constitutional:      General: He is not in acute distress.    Appearance: He is well-developed.  HENT:     Head: Normocephalic and atraumatic.     Right Ear: External ear normal.     Left Ear: External ear normal.  Eyes:     General: No scleral icterus.       Right eye: No discharge.        Left eye: No discharge.     Conjunctiva/sclera: Conjunctivae normal.  Neck:     Trachea: No tracheal deviation.  Cardiovascular:     Rate and  Rhythm: Normal rate and regular rhythm.  Pulmonary:     Effort: Pulmonary effort is normal. No respiratory distress.     Breath sounds: Normal breath sounds. No stridor. No wheezing or rales.  Abdominal:     General: Bowel sounds are normal. There is no distension.     Palpations: Abdomen is soft.     Tenderness: There is no abdominal tenderness. There is no  guarding or rebound.  Musculoskeletal:        General: No tenderness.     Cervical back: Neck supple.  Skin:    General: Skin is warm and dry.     Findings: No rash.  Neurological:     Mental Status: He is alert.     Cranial Nerves: No cranial nerve deficit (no facial droop, extraocular movements intact, no slurred speech).     Sensory: No sensory deficit.     Motor: No abnormal muscle tone or seizure activity.     Coordination: Coordination normal.     ED Results / Procedures / Treatments   Labs (all labs ordered are listed, but only abnormal results are displayed) Labs Reviewed  I-STAT CHEM 8, ED - Abnormal; Notable for the following components:      Result Value   Glucose, Bld 110 (*)    Hemoglobin 10.5 (*)    HCT 31.0 (*)    All other components within normal limits    EKG EKG Interpretation  Date/Time:  Friday February 18 2020 12:03:12 EDT Ventricular Rate:  82 PR Interval:    QRS Duration: 91 QT Interval:  373 QTC Calculation: 436 R Axis:   63 Text Interpretation: Sinus rhythm Abnormal R-wave progression, early transition No significant change since last tracing Confirmed by Dorie Rank [phone]) on 02/18/2020 12:04:49 PM   Radiology No results found.  Procedures Procedures (including critical care time)  Medications Ordered in ED Medications - No data to display  ED Course  I have reviewed the triage vital signs and the nursing notes.  Pertinent labs & imaging results that were available during my care of the patient were reviewed by me and considered in my medical decision making (see chart for  details).  Clinical Course as of Feb 18 1304  Fri Feb 18, 2020  1301 Labs reviewed.  Patient's hemoglobin is decreased to 10.5.  No electrolyte abnormalities.   [JK]  1301 Patient was able to ambulate without difficulty.  He is not feeling lightheaded.   [JK]  1302 Hemoglobin is stable.  It was 10.3 at [facility] yesterday   [JK]    Clinical Course User Index [JK] Dorie Rank, MD   MDM Rules/Calculators/A&P                          Patient presented after near syncopal episode.  Sounds like the patient overexerted himself while exercising.  Patient has an appointment at 2 PM today at [facility] for an infusion.  He does not want to miss that.  He does not really want any significant evaluation.  His hemoglobin is stable.  His EKG is reassuring.  Patient is able to walk without difficulty.  I doubt any serious etiology for his near syncopal event earlier.  I suspect it was related to his chronic medical conditions as well as overexerting himself.  He is stable for discharge. Final Clinical Impression(s) / ED Diagnoses Final diagnoses:  Near syncope    Rx / DC Orders ED Discharge Orders    None       Dorie Rank, MD 02/18/20 1305

## 2020-02-18 NOTE — Therapy (Addendum)
[name] [address] [place], [place], [address] Phone: [phone]   Fax:  [phone]  Physical Therapy Treatment  Patient Details  Name: Jon Carter MRN: [id number] Date of Birth: [birth year] Referring Provider (PT): Dr. Levada Carter   Encounter Date: 02/18/2020    Past Medical History:  Diagnosis Date  . Adjustment disorder with anxiety 06/13/2009   Qualifier: Diagnosis of  By: Jon Bill MD, Jon Carter  related to his sudden MI underwent counseling takes low-dose medication as needed had side effects from an SSRI   . Alcohol addiction ([place])   . Hyperlipidemia    Hx of low HDL zero CAC in 2000 at [place]  . Low testosterone   . STEMI (ST elevation myocardial infarction) ([place]) 04/26/2009   left circumflex DES     Past Surgical History:  Procedure Laterality Date  . CARDIAC CATHETERIZATION  05/02/2009   UA - patent Cfx stent, 50-60% LAD, 50-60% RCA with normal LV funtion (Dr. Adora Carter)  . CATARACT EXTRACTION Left 2009   Jon Carter  . CORONARY ANGIOPLASTY WITH STENT PLACEMENT  04/26/2009   ACS/STEMI - total occlusion of mid Cfx - Xience DES 2.75x51mm (Dr. Corky Carter)  . NM MYOCAR PERF WALL MOTION  04/2012   bruce myoview; mild perfusion defect in basal inferior region (infarct/scar or overlying attenuation); post-stress EF 53%, EKG negative for ischemia, hypertensive response to exercise; abnormal study but unchanged from previous study  . TRANSTHORACIC ECHOCARDIOGRAM  05/2009   EF=50-55%; trace MR; mild TR, normal RSVP; AV mildly sclerotic    There were no vitals filed for this visit.   Subjective Assessment - 02/18/20 1027    Subjective Chemo ends on Monday. So far doing okay.    Pertinent History PMHx significant for thrombocytopenia, epistaxis, monocytosis, HLD, MI s/p DES, CMML2, myeloblastic syndrome, AML, subdural hematoma    Limitations Standing;Walking;House hold activities    How long can you sit  comfortably? no limits    How long can you stand comfortably? 10 min    How long can you walk comfortably? 5-10 min    Diagnostic tests 1. No evidence of acute infarct on this moderately motion degraded exam.2. Similar size of multiloculated subdural hematohygromas along the bilateral cerebral convexities with trace leftward midline shift and partial effacement of the ventricular system.3. Confluent T2 FLAIR hyperintensity in the dorsal pons is nonspecific but may reflect sequelae of subacute versus chronic insult.4. Multifocal linear susceptibility artifact within the dorsal midbrain and midline pons which may reflect underlying vascular lesion and/or petechial hemorrhage.    Patient Stated Goals walk by myself without AD for 45 min, be able to go up the stairs to second floor, get back to normal                        Sit to stand: - Chair only: 5x - required min A - Chair with 1" foam: 5x, intermittent min A <50% of the time - Chair with 2" foam: 10x, SBA only - Chair with 1" foam: 5x - SBA only but pt had little more difficulty compared to 2" foam - Chair with 2" foam + 10lb KB: 10x - Chair with 1" foam: 5x SBA only   Single leg stance: 5 x 10" R and L  Gait training: Fwd and bwd walking: EO: 3 x 20 feet; EC; 3 x 20 feet   Standing gastroc stretch: unilateral: 5 x 20" R and L -pt started  feeling dizzy, pt was walked over to the chair and sat down.  Pt wanted to go to the bathroom. When he stood up he became unresponsive. He was lowered in the chair. EMS was activated. Pt was monitored until EMS arrived. Pt was non-responsive for approximately 1 min. His legs were elevated and tilted back in the chair. He became responsive before EMS arrived. Patient care was transferred to EMS.       PT Short Term Goals - 02/08/20 1617      PT SHORT TERM GOAL #1   Title Patient will be able to ambulate 1000' without AD on level ground to improve walking endurance    Baseline  ambulates with RW for 5'    Time 3    Period Weeks    Status New    Target Date 02/29/20      PT SHORT TERM GOAL #2   Title Patient will be able to perform 5 sit to stands under 20 seconds to improve functional strength    Baseline 28 sec (eval)    Time 3    Period Weeks    Status New    Target Date 02/29/20      PT SHORT TERM GOAL #3   Title Patient will be able to able to go up and down stairs with holding 5lb dumbell in one hand and rail on other hand with alternating gait.    Baseline able to go up/down with one rail and alternating steps    Time 3    Period Weeks    Status New    Target Date 02/29/20      PT SHORT TERM GOAL #4   Title Patient will be able to walk for 20 min with LRAD to improve walking endurance.    Baseline 5-10 min with 2 [employer]    Time 3    Period Weeks    Status New    Target Date 02/29/20      PT SHORT TERM GOAL #5   Title Pt will be able to perform floor transfers with SBA only    Baseline not attempted    Time 3    Period Weeks    Status New    Target Date 02/29/20             PT Long Term Goals - 02/08/20 1659      PT LONG TERM GOAL #1   Title Pt will be able to ambulate for 45 min without AD to improve walking endurance and return to PLOF    Baseline 10 min with RW (eval)    Time 6    Period Weeks    Status New    Target Date 03/21/20      PT LONG TERM GOAL #2   Title Pt will be able to go up and down stairs with carrying 5 lb box in both hands to improve stair negotiation    Baseline one rail, alterating steps, doesn't feel safe carrying things (eval)    Period Weeks    Status New    Target Date 03/21/20      PT LONG TERM GOAL #3   Title Pt will demo 22/24 or better on DGI to improve walking balance    Baseline Eval (17/24)    Time 6    Period Weeks    Status New    Target Date 03/21/20  Patient will benefit from skilled therapeutic intervention in order to improve the following deficits and  impairments:     Visit Diagnosis: Muscle weakness (generalized)  Unsteadiness on feet  Subdural hematoma [facility])     Problem List Patient Active Problem List   Diagnosis Date Noted  . Elevated TSH 10/20/2017  . Monocytosis relative  10/20/2017  . Elevated LFTs 12/16/2015  . Testicular nodule vs cyst 12/10/2013  . Visit for preventive health examination 04/29/2012  . Leukopenia 05/14/2011  . Rosacea 05/14/2011  . Unspecified adverse effect of unspecified drug, medicinal and biological substance 10/19/2010  . Thrombocytopenia ([place]) 10/12/2010  . Hyperlipidemia   . HYPOGONADISM 02/05/2010  . Coronary atherosclerosis 06/13/2009  . POSTSURG PERCUT TRANSLUMINAL COR ANGPLSTY STS 06/13/2009  . ERECTILE DYSFUNCTION, MILD 06/29/2008  . HYPERTRIGLYCERIDEMIA 06/23/2007  . HYPERLIPIDEMIA 06/23/2007  . LOW HDL 06/23/2007    Kerrie Pleasure, PT 02/18/2020, 10:30 AM  [facility] [address] [place], [place], [address] Phone: [phone]   Fax:  [phone]  Name: Jon Carter MRN: [id number] Date of Birth: 1951/03/08

## 2020-02-18 NOTE — ED Notes (Signed)
Pt ambulatory to and from restroom with steady gait 

## 2020-02-19 DIAGNOSIS — Z5111 Encounter for antineoplastic chemotherapy: Secondary | ICD-10-CM | POA: Diagnosis not present

## 2020-02-19 DIAGNOSIS — C9200 Acute myeloblastic leukemia, not having achieved remission: Secondary | ICD-10-CM | POA: Diagnosis not present

## 2020-02-21 DIAGNOSIS — C9200 Acute myeloblastic leukemia, not having achieved remission: Secondary | ICD-10-CM | POA: Diagnosis not present

## 2020-02-21 DIAGNOSIS — Z5111 Encounter for antineoplastic chemotherapy: Secondary | ICD-10-CM | POA: Diagnosis not present

## 2020-02-22 ENCOUNTER — Ambulatory Visit: Payer: Medicare PPO

## 2020-02-22 ENCOUNTER — Other Ambulatory Visit: Payer: Self-pay

## 2020-02-22 ENCOUNTER — Ambulatory Visit: Payer: Medicare PPO | Admitting: Speech Pathology

## 2020-02-22 VITALS — BP 112/71 | HR 99

## 2020-02-22 DIAGNOSIS — M6281 Muscle weakness (generalized): Secondary | ICD-10-CM | POA: Diagnosis not present

## 2020-02-22 DIAGNOSIS — R471 Dysarthria and anarthria: Secondary | ICD-10-CM | POA: Diagnosis not present

## 2020-02-22 DIAGNOSIS — R2689 Other abnormalities of gait and mobility: Secondary | ICD-10-CM | POA: Diagnosis not present

## 2020-02-22 DIAGNOSIS — R498 Other voice and resonance disorders: Secondary | ICD-10-CM | POA: Diagnosis not present

## 2020-02-22 DIAGNOSIS — R2681 Unsteadiness on feet: Secondary | ICD-10-CM | POA: Diagnosis not present

## 2020-02-22 DIAGNOSIS — R1312 Dysphagia, oropharyngeal phase: Secondary | ICD-10-CM | POA: Diagnosis not present

## 2020-02-22 DIAGNOSIS — S065XAA Traumatic subdural hemorrhage with loss of consciousness status unknown, initial encounter: Secondary | ICD-10-CM

## 2020-02-22 DIAGNOSIS — S065X9A Traumatic subdural hemorrhage with loss of consciousness of unspecified duration, initial encounter: Secondary | ICD-10-CM | POA: Diagnosis not present

## 2020-02-22 DIAGNOSIS — R278 Other lack of coordination: Secondary | ICD-10-CM | POA: Diagnosis not present

## 2020-02-22 DIAGNOSIS — R209 Unspecified disturbances of skin sensation: Secondary | ICD-10-CM | POA: Diagnosis not present

## 2020-02-22 NOTE — Patient Instructions (Signed)
   Loud AH! 5x twice a day  Read aloud or recite something rote such as the [place], preamble   Bring in blowie and suckies  Get 3 ring binder with sections for PT OT ST to put in your exercises and handouts  When you need to project, take a big breath to power your volume  If someone asks you to repeat yourself, take a big breath and think Shout!

## 2020-02-22 NOTE — Therapy (Signed)
[facility] [address] [place], [place], [address] Phone: [phone]   Fax:  [phone]  Physical Therapy Treatment  Patient Details  Name: Jon Carter MRN: [id number] Date of Birth: 06/16/1951 Referring Provider (PT): Dr. Levada Schilling   Encounter Date: 02/22/2020   PT End of Session - 02/22/20 1227    Visit Number 5    Number of Visits 13    Date for PT Re-Evaluation 03/21/20    PT Start Time 1015    PT Stop Time 1120    PT Time Calculation (min) 65 min    Equipment Utilized During Treatment Gait belt    Activity Tolerance Treatment limited secondary to medical complications (Comment)    Behavior During Therapy [facility] for tasks assessed/performed           Past Medical History:  Diagnosis Date   Adjustment disorder with anxiety 06/13/2009   Qualifier: Diagnosis of  By: Regis Bill MD, Standley Brooking  related to his sudden MI underwent counseling takes low-dose medication as needed had side effects from an SSRI    Alcohol addiction ([place])    Hyperlipidemia    Hx of low HDL zero CAC in 2000 at [facility] testosterone    STEMI (ST elevation myocardial infarction) ([place]) 04/26/2009   left circumflex DES     Past Surgical History:  Procedure Laterality Date   CARDIAC CATHETERIZATION  05/02/2009   UA - patent Cfx stent, 50-60% LAD, 50-60% RCA with normal LV funtion (Dr. Adora Fridge)   CATARACT EXTRACTION Left 2009   [place]   CORONARY ANGIOPLASTY WITH STENT PLACEMENT  04/26/2009   ACS/STEMI - total occlusion of mid Cfx - Xience DES 2.75x69mm (Dr. Corky Downs)   [place]  04/2012   bruce myoview; mild perfusion defect in basal inferior region (infarct/scar or overlying attenuation); post-stress EF 53%, EKG negative for ischemia, hypertensive response to exercise; abnormal study but unchanged from previous study   TRANSTHORACIC ECHOCARDIOGRAM  05/2009   EF=50-55%; trace MR; mild TR, normal RSVP; AV  mildly sclerotic    Vitals:   02/22/20 1024  BP: 112/71  Pulse: 99           BP 112/72, HR 99 Fwd and bwd walking on floor EC: 2 laps 10 feet Fwd and bwd walking EC on mat: 5 laps 5 feet Lateral walking EC on mat: 5 laps 5 feet BP 118/79, HR 115 Fwd step up on BOSU with one UE support: 10x R and L SpO2 98%, HR 110, BP 103/66 10 x sit to stand with 1" foam in standard chair SpO2 89%, HR 126, BP 115/83 Nustep: level 4 for 10', pt cued to not exceed >80 steps per minute: V - Vitals measured after 5': 115/85, HR: 115 - pt cued to keep steps per minute over 80 steps per minute: vitals after 5':  94% SpO2, 105 HR, BP 106/77 After drinking water, pt stood up, and got little lightheaded Vitals were measured again, HR: 100, SpO2 96%,  BP 103/67 After standing up spo2 93%, HR 115 BP 67/45 (pt was looking pale and clamy) Pt was immediately was put in supine position BP was 75/46, HR 84bpm (after 5 min) Bench was put under his legs to elevate legs BP 116/78, HR 84 bpm Pt was sitting BP immediately after sitting 87/57, HR 103 BP after 5' 85/49 HR 103 Small ACE wrap was put around his stomach to improve BP BP after  abodimal binder: 96/65, HR 115 Pt stood up and BP Immediately after standing up was 116/103, HR 130 PT was transferred to speech for evaluation afterwards.                   PT Education - 02/22/20 1223    Education Details Pt's vitals were contiously monitored. Patient's BP fluctuated up and down. After exercise, patient BP dropped immediately afterwards. Patient also may have orthostatic hypotension as his BP significant changed from supine to sitting and sitting to standing. Placing abdominal binder, helped to increase patient's BP    Person(s) Educated Patient;Spouse    Methods Explanation    Comprehension Verbalized understanding            PT Short Term Goals - 02/08/20 1617      PT SHORT TERM GOAL #1   Title Patient will be able to ambulate  1000' without AD on level ground to improve walking endurance    Baseline ambulates with RW for 5'    Time 3    Period Weeks    Status New    Target Date 02/29/20      PT SHORT TERM GOAL #2   Title Patient will be able to perform 5 sit to stands under 20 seconds to improve functional strength    Baseline 28 sec (eval)    Time 3    Period Weeks    Status New    Target Date 02/29/20      PT SHORT TERM GOAL #3   Title Patient will be able to able to go up and down stairs with holding 5lb dumbell in one hand and rail on other hand with alternating gait.    Baseline able to go up/down with one rail and alternating steps    Time 3    Period Weeks    Status New    Target Date 02/29/20      PT SHORT TERM GOAL #4   Title Patient will be able to walk for 20 min with LRAD to improve walking endurance.    Baseline 5-10 min with 2 [employer]    Time 3    Period Weeks    Status New    Target Date 02/29/20      PT SHORT TERM GOAL #5   Title Pt will be able to perform floor transfers with SBA only    Baseline not attempted    Time 3    Period Weeks    Status New    Target Date 02/29/20             PT Long Term Goals - 02/08/20 1659      PT LONG TERM GOAL #1   Title Pt will be able to ambulate for 45 min without AD to improve walking endurance and return to PLOF    Baseline 10 min with RW (eval)    Time 6    Period Weeks    Status New    Target Date 03/21/20      PT LONG TERM GOAL #2   Title Pt will be able to go up and down stairs with carrying 5 lb box in both hands to improve stair negotiation    Baseline one rail, alterating steps, doesn't feel safe carrying things (eval)    Period Weeks    Status New    Target Date 03/21/20      PT LONG TERM GOAL #3   Title Pt will demo 22/24 or  better on DGI to improve walking balance    Baseline Eval (17/24)    Time 6    Period Weeks    Status New    Target Date 03/21/20                 Plan - 02/22/20 1048    Clinical  Impression Statement Pt demonstrated drop in BP with standing step up exercise on BOSU. patient also felt little light headed afterwards. Pt's vitals were monitored thorughout the session. Pt           Patient will benefit from skilled therapeutic intervention in order to improve the following deficits and impairments:     Visit Diagnosis: Muscle weakness (generalized)  Unsteadiness on feet  Subdural hematoma ([facility])  Balance disorder     Problem List Patient Active Problem List   Diagnosis Date Noted   Elevated TSH 10/20/2017   Monocytosis relative  10/20/2017   Elevated LFTs 12/16/2015   Testicular nodule vs cyst 12/10/2013   Visit for preventive health examination 04/29/2012   Leukopenia 05/14/2011   Rosacea 05/14/2011   Unspecified adverse effect of unspecified drug, medicinal and biological substance 10/19/2010   Thrombocytopenia ([place]) 10/12/2010   Hyperlipidemia    HYPOGONADISM 02/05/2010   Coronary atherosclerosis 06/13/2009   POSTSURG PERCUT TRANSLUMINAL COR ANGPLSTY STS 06/13/2009   ERECTILE DYSFUNCTION, MILD 06/29/2008   HYPERTRIGLYCERIDEMIA 06/23/2007   HYPERLIPIDEMIA 06/23/2007   LOW HDL 06/23/2007    Kerrie Pleasure 02/22/2020, 12:32 PM  [facility] [address] [place], [place], [address] Phone: [phone]   Fax:  [phone]  Name: Jon Carter MRN: [id number] Date of Birth: [birth year]

## 2020-02-22 NOTE — Therapy (Signed)
[facility] [address] [place], [place], [address] Phone: [phone]   Fax:  [phone]  Speech Language Pathology Evaluation  Patient Details  Name: Jon Carter MRN: [id number] Date of Birth: Feb 13, 1951 Referring Provider (SLP): Levada Schilling   Encounter Date: 02/22/2020   End of Session - 02/22/20 1202    Visit Number 1    Number of Visits 17    Date for SLP Re-Evaluation 04/18/20    SLP Start Time 1120   started late at PT was monitoring and managing hypotension   SLP Stop Time  1145    SLP Time Calculation (min) 25 min    Activity Tolerance Patient tolerated treatment well           Past Medical History:  Diagnosis Date  . Adjustment disorder with anxiety 06/13/2009   Qualifier: Diagnosis of  By: Regis Bill MD, Standley Brooking  related to his sudden MI underwent counseling takes low-dose medication as needed had side effects from an SSRI   . Alcohol addiction ([place])   . Hyperlipidemia    Hx of low HDL zero CAC in 2000 at [facility]  . Low testosterone   . STEMI (ST elevation myocardial infarction) ([place] [facility]) 04/26/2009   left circumflex DES     Past Surgical History:  Procedure Laterality Date  . CARDIAC CATHETERIZATION  05/02/2009   UA - patent Cfx stent, 50-60% LAD, 50-60% RCA with normal LV funtion (Dr. Adora Fridge)  . CATARACT EXTRACTION Left 2009   [facility]  . CORONARY ANGIOPLASTY WITH STENT PLACEMENT  04/26/2009   ACS/STEMI - total occlusion of mid Cfx - Xience DES 2.75x25mm (Dr. Corky Downs)  . NM MYOCAR PERF WALL MOTION  04/2012   bruce myoview; mild perfusion defect in basal inferior region (infarct/scar or overlying attenuation); post-stress EF 53%, EKG negative for ischemia, hypertensive response to exercise; abnormal study but unchanged from previous study  . TRANSTHORACIC ECHOCARDIOGRAM  05/2009   EF=50-55%; trace MR; mild TR, normal RSVP; AV mildly sclerotic    There were no vitals filed for this visit.    Subjective Assessment - 02/22/20 1152    Subjective Eval began 20 minutes late due to significant orthostatic hypotension with PT    Patient is accompained by: Family member   wife, Rosemarie Ax   Currently in Pain? No/denies              SLP Evaluation [facility] - 02/22/20 1152      SLP Visit Information   SLP Received On 02/22/20    Referring Provider (SLP) Levada Schilling    Onset Date December 10, 2019    Medical Diagnosis CVA/SDH      Subjective   Patient/Family Stated Goal To get well and back to normal      General Information   HPI Patient is a 69 year old previously health male recently discharged after prolonged and complicated hospitalization from [facility] with acute myeloid leukemia, acute respiratory failure, and subdural hemorrhage.  Patient is currently undergoing chemotherapy both via IV and oral agents.  Patient requires assistance for some basic self care skills due to decreased activity tolerance, decreased balance, generalized weakness, decreased range of motion     Mobility Status walker, receiving PT arrives in wc due to hypotension      Prior Functional Status   Cognitive/Linguistic Baseline Within functional limits    Type of Home House     Lives With Spouse    Available Support Family  Vocation Retired      Charity fundraiser Status Impaired/Different from baseline    Area of Impairment Awareness;Problem solving;Attention;Memory      Auditory Comprehension   Overall Auditory Comprehension Appears within functional limits for tasks assessed      Expression   Primary Mode of Expression Verbal      Oral Motor/Sensory Function   Overall Oral Motor/Sensory Function Appears within functional limits for tasks assessed      Motor Speech   Overall Motor Speech Impaired    Respiration Impaired    Level of Impairment Sentence    Phonation Hoarse;Low vocal intensity;Aphonic;Breathy    Resonance Within functional limits    Articulation Within  functional limitis    Intelligibility Intelligibility reduced    Word 75-100% accurate    Phrase 75-100% accurate    Sentence 75-100% accurate    Conversation 50-74% accurate    Motor Planning Witnin functional limits    Interfering Components --   Ear feels blocked, pt hearing himself talk   Effective Techniques Increased vocal intensity;Pause                           SLP Education - 02/22/20 1158    Education Details HEP for dysarthria/voice; swallowing precautions    Person(s) Educated Patient;Spouse    Methods Explanation;Verbal cues;Handout    Comprehension Verbalized understanding;Verbal cues required;Need further instruction            SLP Short Term Goals - 02/22/20 1218      SLP SHORT TERM GOAL #1   Title Pt will complete HEP for dysarthria and dysphonia with rare min A over 2 sessions    Time 8    Period Weeks    Status New      SLP SHORT TERM GOAL #2   Title Pt will follow 3 aspects of vocal hygiene program over 3 sesions with rare min A    Time 4    Period Weeks    Status New      SLP SHORT TERM GOAL #3   Title Pt will demonstrate adequate breath support and volume of 70dB on structured sentences 18/20 with mod I    Time 4    Period Weeks    Status New      SLP SHORT TERM GOAL #4   Title Pt will follow swallow precautions with rare min A over 2 sessions    Time 4    Period Weeks    Status New            SLP Long Term Goals - 02/22/20 1710      SLP LONG TERM GOAL #1   Title Pt will tolerate regluar diet and thin liquids with not over s/s of apsiration    Time 8    Period Weeks    Status New      SLP LONG TERM GOAL #2   Title Pt will utilize abdominal breathing and average 70dB in 15 minute conversation with rare min A    Time 8    Period Weeks    Status New      SLP LONG TERM GOAL #3   Title Pt will complete HEP for voice with mod I over 4 sessions    Baseline may be modified after ENT consult tomorrow    Time 8     Period Weeks    Status New      SLP LONG  TERM GOAL #4   Title Pt will demonstrate clear phonation over 20 minute conversation with rare min A    Time 8    Period Weeks    Status New            Plan - 02/22/20 1203    Clinical Impression Statement Jon Carter is referred fo outpt ST due to dysarthria, dysphonia and dysphagia. Pt initially seen in the gym, with voice primarily aphonic, hoarse and reduced intelligilbity. In ST room, with sound level meter, pt with sub WNL volume range 62 to 70dB. Jon Carter is familiar with loud Ah and RMST however he has not carried over ST HEP. Loud /a/ initally averaged 75dB. This increased to 84dB average with rated effort of 7/10 with instruction. Moderate hoarseness with pitch breaks. He is seeing ENT tomorrow for larynngoscopy. Defered swallow assessment for next visit as pt and spouse report he is tolerated regular diet but is not carrying over swallow precautions.   .  Voice Handicap Index  was sent home to be completed and returned due to time constratints)  Habitual pitch is  210 Hz, range: 110-246  Hz in extended speech, high  for age/gender. Maximum Phonation Time (MPT) for /a/ is 9.42 seconds (<12 seconds is suggestive of glottal/respiratory insufficiency). /s/ to /z/ ratio is:: 1.2 (>1.4 may indicate laryngeal pathology)). Vocal quality is hoarse, with pitch breaks, intermittent aphonia, breathy low volume. I recommend skilled ST to maximize intelligibility, safety of swallow. Goals and dx may be modified following ENT consult tomorrow (02/23/20)     Speech Therapy Frequency 2x / week    Duration --   8 weeks or 17 visits   Treatment/Interventions Aspiration precaution training;Environmental controls;Cueing hierarchy;Oral motor exercises;SLP instruction and feedback;Compensatory strategies;Functional tasks;Cognitive reorganization;Compensatory techniques;Pharyngeal strengthening exercises;Diet toleration management by SLP;Trials of upgraded  texture/liquids;Internal/external aids;Patient/family education           Patient will benefit from skilled therapeutic intervention in order to improve the following deficits and impairments:   Other voice and resonance disorders  Dysarthria and anarthria  Dysphagia, oropharyngeal phase    Problem List Patient Active Problem List   Diagnosis Date Noted  . Elevated TSH 10/20/2017  . Monocytosis relative  10/20/2017  . Elevated LFTs 12/16/2015  . Testicular nodule vs cyst 12/10/2013  . Visit for preventive health examination 04/29/2012  . Leukopenia 05/14/2011  . Rosacea 05/14/2011  . Unspecified adverse effect of unspecified drug, medicinal and biological substance 10/19/2010  . Thrombocytopenia ([place]) 10/12/2010  . Hyperlipidemia   . HYPOGONADISM 02/05/2010  . Coronary atherosclerosis 06/13/2009  . POSTSURG PERCUT TRANSLUMINAL COR ANGPLSTY STS 06/13/2009  . ERECTILE DYSFUNCTION, MILD 06/29/2008  . HYPERTRIGLYCERIDEMIA 06/23/2007  . HYPERLIPIDEMIA 06/23/2007  . LOW HDL 06/23/2007    Dallys Nowakowski, Annye Rusk MS, CCC-SLP 02/22/2020, 5:14 PM  [facility] [address] [place], [place], [address] Phone: [phone]   Fax:  [phone]  Name: SEVE MONETTE MRN: [id number] Date of Birth: 1950/11/27

## 2020-02-23 DIAGNOSIS — R490 Dysphonia: Secondary | ICD-10-CM | POA: Diagnosis not present

## 2020-02-23 DIAGNOSIS — J3802 Paralysis of vocal cords and larynx, bilateral: Secondary | ICD-10-CM | POA: Diagnosis not present

## 2020-02-23 DIAGNOSIS — Z8701 Personal history of pneumonia (recurrent): Secondary | ICD-10-CM | POA: Diagnosis not present

## 2020-02-23 DIAGNOSIS — C9200 Acute myeloblastic leukemia, not having achieved remission: Secondary | ICD-10-CM | POA: Diagnosis not present

## 2020-02-23 DIAGNOSIS — J383 Other diseases of vocal cords: Secondary | ICD-10-CM | POA: Diagnosis not present

## 2020-02-23 DIAGNOSIS — R1312 Dysphagia, oropharyngeal phase: Secondary | ICD-10-CM | POA: Diagnosis not present

## 2020-02-24 ENCOUNTER — Ambulatory Visit: Payer: Medicare PPO

## 2020-02-24 ENCOUNTER — Encounter: Payer: Medicare PPO | Admitting: Occupational Therapy

## 2020-02-24 DIAGNOSIS — C9200 Acute myeloblastic leukemia, not having achieved remission: Secondary | ICD-10-CM | POA: Diagnosis not present

## 2020-02-25 ENCOUNTER — Ambulatory Visit: Payer: Medicare PPO

## 2020-02-25 ENCOUNTER — Other Ambulatory Visit: Payer: Self-pay

## 2020-02-25 DIAGNOSIS — R2681 Unsteadiness on feet: Secondary | ICD-10-CM | POA: Diagnosis not present

## 2020-02-25 DIAGNOSIS — S065XAA Traumatic subdural hemorrhage with loss of consciousness status unknown, initial encounter: Secondary | ICD-10-CM

## 2020-02-25 DIAGNOSIS — R2689 Other abnormalities of gait and mobility: Secondary | ICD-10-CM | POA: Diagnosis not present

## 2020-02-25 DIAGNOSIS — M6281 Muscle weakness (generalized): Secondary | ICD-10-CM | POA: Diagnosis not present

## 2020-02-25 DIAGNOSIS — R209 Unspecified disturbances of skin sensation: Secondary | ICD-10-CM | POA: Diagnosis not present

## 2020-02-25 DIAGNOSIS — S065X9A Traumatic subdural hemorrhage with loss of consciousness of unspecified duration, initial encounter: Secondary | ICD-10-CM | POA: Diagnosis not present

## 2020-02-25 DIAGNOSIS — R498 Other voice and resonance disorders: Secondary | ICD-10-CM | POA: Diagnosis not present

## 2020-02-25 DIAGNOSIS — R471 Dysarthria and anarthria: Secondary | ICD-10-CM | POA: Diagnosis not present

## 2020-02-25 DIAGNOSIS — R1312 Dysphagia, oropharyngeal phase: Secondary | ICD-10-CM | POA: Diagnosis not present

## 2020-02-25 DIAGNOSIS — R278 Other lack of coordination: Secondary | ICD-10-CM | POA: Diagnosis not present

## 2020-02-25 NOTE — Therapy (Signed)
[facility] [address] [place], [place], [address] Phone: [phone]   Fax:  [phone]  Physical Therapy Treatment  Patient Details  Name: Jon Carter MRN: [id number] Date of Birth: 02-May-1951 Referring Provider (PT): Dr. Levada Schilling   Encounter Date: 02/25/2020   PT End of Session - 02/25/20 1532    Visit Number 6    Number of Visits 13    Date for PT Re-Evaluation 03/21/20    PT Start Time 1093    PT Stop Time 1530    PT Time Calculation (min) 45 min    Equipment Utilized During Treatment Gait belt    Activity Tolerance Treatment limited secondary to medical complications (Comment)    Behavior During Therapy [facility] for tasks assessed/performed           Past Medical History:  Diagnosis Date  . Adjustment disorder with anxiety 06/13/2009   Qualifier: Diagnosis of  By: Regis Bill MD, Standley Brooking  related to his sudden MI underwent counseling takes low-dose medication as needed had side effects from an SSRI   . Alcohol addiction ([place])   . Hyperlipidemia    Hx of low HDL zero CAC in 2000 at [facility]  . Low testosterone   . STEMI (ST elevation myocardial infarction) ([facility]) 04/26/2009   left circumflex DES     Past Surgical History:  Procedure Laterality Date  . CARDIAC CATHETERIZATION  05/02/2009   UA - patent Cfx stent, 50-60% LAD, 50-60% RCA with normal LV funtion (Dr. Adora Fridge)  . CATARACT EXTRACTION Left 2009   [facility]  . CORONARY ANGIOPLASTY WITH STENT PLACEMENT  04/26/2009   ACS/STEMI - total occlusion of mid Cfx - Xience DES 2.75x40mm (Dr. Corky Downs)  . NM MYOCAR PERF WALL MOTION  04/2012   bruce myoview; mild perfusion defect in basal inferior region (infarct/scar or overlying attenuation); post-stress EF 53%, EKG negative for ischemia, hypertensive response to exercise; abnormal study but unchanged from previous study  . TRANSTHORACIC ECHOCARDIOGRAM  05/2009   EF=50-55%; trace MR; mild TR, normal RSVP; AV  mildly sclerotic    There were no vitals filed for this visit.   Subjective Assessment - 02/25/20 1459    Subjective MD took him off the BP medication all together.    Patient is accompained by: Family member    Pertinent History PMHx significant for thrombocytopenia, epistaxis, monocytosis, HLD, MI s/p DES, CMML2, myeloblastic syndrome, AML, subdural hematoma    Limitations Standing;Walking;House hold activities    How long can you sit comfortably? no limits    How long can you stand comfortably? 10 min    How long can you walk comfortably? 5-10 min    Diagnostic tests 1. No evidence of acute infarct on this moderately motion degraded exam.2. Similar size of multiloculated subdural hematohygromas along the bilateral cerebral convexities with trace leftward midline shift and partial effacement of the ventricular system.3. Confluent T2 FLAIR hyperintensity in the dorsal pons is nonspecific but may reflect sequelae of subacute versus chronic insult.4. Multifocal linear susceptibility artifact within the dorsal midbrain and midline pons which may reflect underlying vascular lesion and/or petechial hemorrhage.    Patient Stated Goals walk by myself without AD for 45 min, be able to go up the stairs to second floor, get back to normal                 Supine SpO2 98% Orthostatic hypotension measurement: Supine (after 5 min) 122/71, HR 86, no symptoms Sitting (  after 1 min) : 107/67, HR 102  Standing (after 1 min): 103/67, HR 103 Standing (after 3 min) 112/72, HR 116  Nu step: level 3, LE only, steps per minute <70 Vitals after 5 min: 127/77, HR 97 bpm Vitals after 10 min: 116/76, HR 100 bpm After standing up after 1 min: 115/70, HR 123 10 x sit to stand from Nustep chair Pt remained standing, after 1 min: 115/74,  HR 124 Ambulated 575' without AD and CGA, SpO2 98%, BP 118/71, HR 102 bpm Pt reminded again to make sure he counts to 10 after standing up before he  moves/walks                     PT Education - 02/25/20 1533    Education Details Pt asked if he can use cane. Pt was educated that as long as he feels comfortable, he can use cane in the house when wife is home. When she is not, he should use walker. For outside walking he should use walker    Person(s) Educated Patient;Spouse    Methods Explanation    Comprehension Verbalized understanding            PT Short Term Goals - 02/08/20 1617      PT SHORT TERM GOAL #1   Title Patient will be able to ambulate 1000' without AD on level ground to improve walking endurance    Baseline ambulates with RW for 5'    Time 3    Period Weeks    Status New    Target Date 02/29/20      PT SHORT TERM GOAL #2   Title Patient will be able to perform 5 sit to stands under 20 seconds to improve functional strength    Baseline 28 sec (eval)    Time 3    Period Weeks    Status New    Target Date 02/29/20      PT SHORT TERM GOAL #3   Title Patient will be able to able to go up and down stairs with holding 5lb dumbell in one hand and rail on other hand with alternating gait.    Baseline able to go up/down with one rail and alternating steps    Time 3    Period Weeks    Status New    Target Date 02/29/20      PT SHORT TERM GOAL #4   Title Patient will be able to walk for 20 min with LRAD to improve walking endurance.    Baseline 5-10 min with 2 [employer]    Time 3    Period Weeks    Status New    Target Date 02/29/20      PT SHORT TERM GOAL #5   Title Pt will be able to perform floor transfers with SBA only    Baseline not attempted    Time 3    Period Weeks    Status New    Target Date 02/29/20             PT Long Term Goals - 02/08/20 1659      PT LONG TERM GOAL #1   Title Pt will be able to ambulate for 45 min without AD to improve walking endurance and return to PLOF    Baseline 10 min with RW (eval)    Time 6    Period Weeks    Status New    Target Date  03/21/20  PT LONG TERM GOAL #2   Title Pt will be able to go up and down stairs with carrying 5 lb box in both hands to improve stair negotiation    Baseline one rail, alterating steps, doesn't feel safe carrying things (eval)    Period Weeks    Status New    Target Date 03/21/20      PT LONG TERM GOAL #3   Title Pt will demo 22/24 or better on DGI to improve walking balance    Baseline Eval (17/24)    Time 6    Period Weeks    Status New    Target Date 03/21/20                 Plan - 02/25/20 1510    Clinical Impression Statement Today;s sessions, vitals were measured thorughout the sessoin, Patient demonstrated drop of 59RCBU systolic going from supin to sitting which is significant. No significant change in drop from sitting to standing (only 4 mmHg systolic). BP response during exercises was WNL.    Personal Factors and Comorbidities Comorbidity 3+    Comorbidities PMHx significant for thrombocytopenia, epistaxis, monocytosis, HLD, MI s/p DES, CMML2, myeloblastic syndrome, AML, subdural hematoma    Examination-Activity Limitations Carry;Lift;Stand;Stairs;Squat    Examination-Participation Restrictions Cleaning;Community Activity;Driving;Laundry;Meal Prep;Shop;Yard Work    Stability/Clinical Decision Making Evolving/Moderate complexity    Clinical Decision Making Moderate    Rehab Potential Good    PT Frequency 2x / week    PT Duration 6 weeks    PT Treatment/Interventions ADLs/Self Care Home Management;Gait training;Stair training;Functional mobility training;Therapeutic activities;Therapeutic exercise;Balance training;Neuromuscular re-education;Manual techniques;Patient/family education    PT Next Visit Plan work on floor transfers, SLS, Eyes closed balance activities on compliant surfaces, dynamic gait without AD    PT Home Exercise Plan stairs once a day, walking 10 min a day, sit to stand without HHA: 10x; Access Code: [email or web address]:  [email or web address]: 06/18/2021Prepared by: Gwenyth Bouillon PatelExercisesSit to Stand without Arm Support - 2 x daily - 7 x weekly - 2 sets - 10 repsBackwards Walking - 2 x daily - 7 x weekly - 3 sets - 10 repsSingle Leg Stance - 2 x daily - 7 x weekly - 1 sets - 5 reps - 10 holdStanding Gastroc Stretch on Foam 1/2 Roll - 1 x daily - 7 x weekly - 1 sets - 5 reps - 20 holdStanding Heel Raise with Support - 1 x daily - 7 x weekly - 2 sets - 10 repsStanding Toe Raises at Chair - 1 x daily - 7 x weekly - 2 sets - 10 reps    Consulted and Agree with Plan of Care Patient;Family member/caregiver    Family Member Consulted Wife           Patient will benefit from skilled therapeutic intervention in order to improve the following deficits and impairments:  Abnormal gait, Decreased balance, Decreased activity tolerance, Decreased mobility, Decreased endurance, Decreased safety awareness, Decreased strength, Difficulty walking  Visit Diagnosis: Muscle weakness (generalized)  Unsteadiness on feet  Subdural hematoma ([facility])  Balance disorder     Problem List Patient Active Problem List   Diagnosis Date Noted  . Elevated TSH 10/20/2017  . Monocytosis relative  10/20/2017  . Elevated LFTs 12/16/2015  . Testicular nodule vs cyst 12/10/2013  . Visit for preventive health examination 04/29/2012  . Leukopenia 05/14/2011  . Rosacea 05/14/2011  . Unspecified adverse effect of unspecified drug, medicinal and biological substance 10/19/2010  . Thrombocytopenia ([place]) 10/12/2010  . Hyperlipidemia   .  HYPOGONADISM 02/05/2010  . Coronary atherosclerosis 06/13/2009  . POSTSURG PERCUT TRANSLUMINAL COR ANGPLSTY STS 06/13/2009  . ERECTILE DYSFUNCTION, MILD 06/29/2008  . HYPERTRIGLYCERIDEMIA 06/23/2007  . HYPERLIPIDEMIA 06/23/2007  . LOW HDL 06/23/2007    Kerrie Pleasure 02/25/2020, 3:34 PM  [facility] [address] [place], [place], [address] Phone: [phone]   Fax:  [phone]  Name: Jon Carter MRN: [id number] Date of Birth: 1950/09/14

## 2020-02-28 ENCOUNTER — Ambulatory Visit: Payer: Medicare PPO

## 2020-02-28 DIAGNOSIS — C9200 Acute myeloblastic leukemia, not having achieved remission: Secondary | ICD-10-CM | POA: Diagnosis not present

## 2020-02-29 ENCOUNTER — Other Ambulatory Visit: Payer: Self-pay

## 2020-02-29 ENCOUNTER — Ambulatory Visit: Payer: Medicare PPO

## 2020-02-29 DIAGNOSIS — R498 Other voice and resonance disorders: Secondary | ICD-10-CM | POA: Diagnosis not present

## 2020-02-29 DIAGNOSIS — R471 Dysarthria and anarthria: Secondary | ICD-10-CM | POA: Diagnosis not present

## 2020-02-29 DIAGNOSIS — R1312 Dysphagia, oropharyngeal phase: Secondary | ICD-10-CM | POA: Diagnosis not present

## 2020-02-29 DIAGNOSIS — M6281 Muscle weakness (generalized): Secondary | ICD-10-CM | POA: Diagnosis not present

## 2020-02-29 DIAGNOSIS — S065X9A Traumatic subdural hemorrhage with loss of consciousness of unspecified duration, initial encounter: Secondary | ICD-10-CM | POA: Diagnosis not present

## 2020-02-29 DIAGNOSIS — R2689 Other abnormalities of gait and mobility: Secondary | ICD-10-CM

## 2020-02-29 DIAGNOSIS — R2681 Unsteadiness on feet: Secondary | ICD-10-CM | POA: Diagnosis not present

## 2020-02-29 DIAGNOSIS — H9193 Unspecified hearing loss, bilateral: Secondary | ICD-10-CM | POA: Diagnosis not present

## 2020-02-29 DIAGNOSIS — S065XAA Traumatic subdural hemorrhage with loss of consciousness status unknown, initial encounter: Secondary | ICD-10-CM

## 2020-02-29 DIAGNOSIS — R278 Other lack of coordination: Secondary | ICD-10-CM | POA: Diagnosis not present

## 2020-02-29 DIAGNOSIS — R209 Unspecified disturbances of skin sensation: Secondary | ICD-10-CM | POA: Diagnosis not present

## 2020-02-29 NOTE — Therapy (Addendum)
[facility] [address] [place], [place], [address] Phone: [phone]   Fax:  [phone]  Physical Therapy Treatment  Patient Details  Name: Jon Carter MRN: [id number] Date of Birth: 01/08/1951 Referring Provider (PT): Dr. Levada Schilling   Encounter Date: 02/29/2020   PT End of Session - 02/29/20 1201    Visit Number 7    Number of Visits 13    Date for PT Re-Evaluation 03/21/20    PT Start Time 1145    PT Stop Time 1230    PT Time Calculation (min) 45 min    Equipment Utilized During Treatment Gait belt    Activity Tolerance Treatment limited secondary to medical complications (Comment)    Behavior During Therapy [facility] for tasks assessed/performed           Past Medical History:  Diagnosis Date  . Adjustment disorder with anxiety 06/13/2009   Qualifier: Diagnosis of  By: Regis Bill MD, Standley Brooking  related to his sudden MI underwent counseling takes low-dose medication as needed had side effects from an SSRI   . Alcohol addiction ([place])   . Hyperlipidemia    Hx of low HDL zero CAC in 2000 at [place]  . Low testosterone   . STEMI (ST elevation myocardial infarction) ([place]) 04/26/2009   left circumflex DES     Past Surgical History:  Procedure Laterality Date  . CARDIAC CATHETERIZATION  05/02/2009   UA - patent Cfx stent, 50-60% LAD, 50-60% RCA with normal LV funtion (Dr. Adora Fridge)  . CATARACT EXTRACTION Left 2009   [place]  . CORONARY ANGIOPLASTY WITH STENT PLACEMENT  04/26/2009   ACS/STEMI - total occlusion of mid Cfx - Xience DES 2.75x12mm (Dr. Corky Downs)  . NM MYOCAR PERF WALL MOTION  04/2012   bruce myoview; mild perfusion defect in basal inferior region (infarct/scar or overlying attenuation); post-stress EF 53%, EKG negative for ischemia, hypertensive response to exercise; abnormal study but unchanged from previous study  . TRANSTHORACIC ECHOCARDIOGRAM  05/2009   EF=50-55%; trace MR; mild TR, normal RSVP; AV  mildly sclerotic    There were no vitals filed for this visit.   Subjective Assessment - 02/29/20 1154    Subjective Symptoms are stable    Patient is accompained by: Family member    Pertinent History PMHx significant for thrombocytopenia, epistaxis, monocytosis, HLD, MI s/p DES, CMML2, myeloblastic syndrome, AML, subdural hematoma    Limitations Standing;Walking;House hold activities    How long can you sit comfortably? no limits    How long can you stand comfortably? 10 min    How long can you walk comfortably? 5-10 min    Diagnostic tests 1. No evidence of acute infarct on this moderately motion degraded exam.2. Similar size of multiloculated subdural hematohygromas along the bilateral cerebral convexities with trace leftward midline shift and partial effacement of the ventricular system.3. Confluent T2 FLAIR hyperintensity in the dorsal pons is nonspecific but may reflect sequelae of subacute versus chronic insult.4. Multifocal linear susceptibility artifact within the dorsal midbrain and midline pons which may reflect underlying vascular lesion and/or petechial hemorrhage.    Patient Stated Goals walk by myself without AD for 45 min, be able to go up the stairs to second floor, get back to normal    Currently in Pain? No/denies                Treatment Resting Vitals: 112/78, 93bpm Nustep: level 5 for 10' legs only Vitals after Nustep: 115/81,  99bpm Ambulated 300' on level grass with 10 feet of gravel pathway with CGA and no AD, cues to look down in front of him Walked up a grassy hill: 100' up and down without AD and CGA Walked up and down grassy curb: 1x without AD  Vitals after walking: 113/80, 106bpm Fwd step up: 8" box 1 x 5 R and L, no HHA Sit to stand: 2 x 5, no HHA SLS: 5 x 10" R and L  [place] walking: 2 laps of 10 steps R and L Vitals at end of the session: 115/79, 114bpm                     PT Short Term Goals - 02/08/20 1617      PT  SHORT TERM GOAL #1   Title Patient will be able to ambulate 1000' without AD on level ground to improve walking endurance    Baseline ambulates with RW for 5'    Time 3    Period Weeks    Status New    Target Date 02/29/20      PT SHORT TERM GOAL #2   Title Patient will be able to perform 5 sit to stands under 20 seconds to improve functional strength    Baseline 28 sec (eval)    Time 3    Period Weeks    Status New    Target Date 02/29/20      PT SHORT TERM GOAL #3   Title Patient will be able to able to go up and down stairs with holding 5lb dumbell in one hand and rail on other hand with alternating gait.    Baseline able to go up/down with one rail and alternating steps    Time 3    Period Weeks    Status New    Target Date 02/29/20      PT SHORT TERM GOAL #4   Title Patient will be able to walk for 20 min with LRAD to improve walking endurance.    Baseline 5-10 min with 2 [employer]    Time 3    Period Weeks    Status New    Target Date 02/29/20      PT SHORT TERM GOAL #5   Title Pt will be able to perform floor transfers with SBA only    Baseline not attempted    Time 3    Period Weeks    Status New    Target Date 02/29/20             PT Long Term Goals - 02/08/20 1659      PT LONG TERM GOAL #1   Title Pt will be able to ambulate for 45 min without AD to improve walking endurance and return to PLOF    Baseline 10 min with RW (eval)    Time 6    Period Weeks    Status New    Target Date 03/21/20      PT LONG TERM GOAL #2   Title Pt will be able to go up and down stairs with carrying 5 lb box in both hands to improve stair negotiation    Baseline one rail, alterating steps, doesn't feel safe carrying things (eval)    Period Weeks    Status New    Target Date 03/21/20      PT LONG TERM GOAL #3   Title Pt will demo 22/24 or better on DGI to improve walking balance  Baseline Eval (17/24)    Time 6    Period Weeks    Status New    Target Date 03/21/20                  Plan - 02/29/20 1203    Clinical Impression Statement Today's session was focused on gradual progression of wlaking to more unstable surface. Pt's vitals were stable thorughout the session.    PT Next Visit Plan New chemo (new chemo meds) starts 03/13/20- watch vitals carefully           Patient will benefit from skilled therapeutic intervention in order to improve the following deficits and impairments:     Visit Diagnosis: Muscle weakness (generalized)  Unsteadiness on feet  Subdural hematoma ([facility])  Balance disorder     Problem List Patient Active Problem List   Diagnosis Date Noted  . Elevated TSH 10/20/2017  . Monocytosis relative  10/20/2017  . Elevated LFTs 12/16/2015  . Testicular nodule vs cyst 12/10/2013  . Visit for preventive health examination 04/29/2012  . Leukopenia 05/14/2011  . Rosacea 05/14/2011  . Unspecified adverse effect of unspecified drug, medicinal and biological substance 10/19/2010  . Thrombocytopenia ([place]) 10/12/2010  . Hyperlipidemia   . HYPOGONADISM 02/05/2010  . Coronary atherosclerosis 06/13/2009  . POSTSURG PERCUT TRANSLUMINAL COR ANGPLSTY STS 06/13/2009  . ERECTILE DYSFUNCTION, MILD 06/29/2008  . HYPERTRIGLYCERIDEMIA 06/23/2007  . HYPERLIPIDEMIA 06/23/2007  . LOW HDL 06/23/2007    Kerrie Pleasure 02/29/2020, 12:25 PM  [facility] [address] [place], [place], [address] Phone: [phone]   Fax:  [phone]  Name: Jon Carter MRN: [id number] Date of Birth: 1950-10-08

## 2020-03-02 ENCOUNTER — Other Ambulatory Visit: Payer: Self-pay

## 2020-03-02 ENCOUNTER — Ambulatory Visit: Payer: Medicare PPO | Attending: Physical Medicine & Rehabilitation

## 2020-03-02 DIAGNOSIS — C9200 Acute myeloblastic leukemia, not having achieved remission: Secondary | ICD-10-CM | POA: Diagnosis not present

## 2020-03-02 DIAGNOSIS — R278 Other lack of coordination: Secondary | ICD-10-CM | POA: Diagnosis not present

## 2020-03-02 DIAGNOSIS — R2689 Other abnormalities of gait and mobility: Secondary | ICD-10-CM | POA: Diagnosis not present

## 2020-03-02 DIAGNOSIS — S065X9A Traumatic subdural hemorrhage with loss of consciousness of unspecified duration, initial encounter: Secondary | ICD-10-CM | POA: Diagnosis not present

## 2020-03-02 DIAGNOSIS — M6281 Muscle weakness (generalized): Secondary | ICD-10-CM | POA: Diagnosis not present

## 2020-03-02 DIAGNOSIS — R471 Dysarthria and anarthria: Secondary | ICD-10-CM | POA: Insufficient documentation

## 2020-03-02 DIAGNOSIS — R498 Other voice and resonance disorders: Secondary | ICD-10-CM | POA: Insufficient documentation

## 2020-03-02 DIAGNOSIS — S065XAA Traumatic subdural hemorrhage with loss of consciousness status unknown, initial encounter: Secondary | ICD-10-CM

## 2020-03-02 DIAGNOSIS — R2681 Unsteadiness on feet: Secondary | ICD-10-CM | POA: Diagnosis not present

## 2020-03-02 DIAGNOSIS — R209 Unspecified disturbances of skin sensation: Secondary | ICD-10-CM | POA: Diagnosis not present

## 2020-03-02 NOTE — Therapy (Signed)
[facility] [address] [place], [place], [address] Phone: [phone]   Fax:  [phone]  Physical Therapy Treatment  Patient Details  Name: Jon Carter MRN: [id number] Date of Birth: Apr 09, 1951 Referring Provider (PT): Dr. Levada Schilling   Encounter Date: 03/02/2020   PT End of Session - 03/02/20 1618    Visit Number 8    Number of Visits 13    Date for PT Re-Evaluation 03/21/20    PT Start Time 5027    PT Stop Time 1615    PT Time Calculation (min) 45 min    Equipment Utilized During Treatment Gait belt    Activity Tolerance Treatment limited secondary to medical complications (Comment)    Behavior During Therapy [facility] for tasks assessed/performed           Past Medical History:  Diagnosis Date  . Adjustment disorder with anxiety 06/13/2009   Qualifier: Diagnosis of  By: Regis Bill MD, Standley Brooking  related to his sudden MI underwent counseling takes low-dose medication as needed had side effects from an SSRI   . Alcohol addiction ([place])   . Hyperlipidemia    Hx of low HDL zero CAC in 2000 at [place]  . Low testosterone   . STEMI (ST elevation myocardial infarction) ([place]) 04/26/2009   left circumflex DES     Past Surgical History:  Procedure Laterality Date  . CARDIAC CATHETERIZATION  05/02/2009   UA - patent Cfx stent, 50-60% LAD, 50-60% RCA with normal LV funtion (Dr. Adora Fridge)  . CATARACT EXTRACTION Left 2009   [place]  . CORONARY ANGIOPLASTY WITH STENT PLACEMENT  04/26/2009   ACS/STEMI - total occlusion of mid Cfx - Xience DES 2.75x28mm (Dr. Corky Downs)  . NM MYOCAR PERF WALL MOTION  04/2012   bruce myoview; mild perfusion defect in basal inferior region (infarct/scar or overlying attenuation); post-stress EF 53%, EKG negative for ischemia, hypertensive response to exercise; abnormal study but unchanged from previous study  . TRANSTHORACIC ECHOCARDIOGRAM  05/2009   EF=50-55%; trace MR; mild TR, normal RSVP; AV  mildly sclerotic    There were no vitals filed for this visit.       BP at rest 116/75 Nustep: level 2 UE and LE for 10' BP after Nustep: 122/75 Fwd step up: 8" curb: 15lb in his contralateral hand: 10x R and L Sit to stand: 10lb KB: 10x with min A required intermittently BP 140/88, 99bpm (immediately after above exercise) BP 115/78, 99bpm (after 5') Side steps: yellow band: 3 x 10 Monster walks: fwd and bwd: 3 x 10 yllow band BP 121/77, BP 106bpm (immediately afterwards)                          PT Short Term Goals - 02/08/20 1617      PT SHORT TERM GOAL #1   Title Patient will be able to ambulate 1000' without AD on level ground to improve walking endurance    Baseline ambulates with RW for 5'    Time 3    Period Weeks    Status New    Target Date 02/29/20      PT SHORT TERM GOAL #2   Title Patient will be able to perform 5 sit to stands under 20 seconds to improve functional strength    Baseline 28 sec (eval)    Time 3    Period Weeks    Status New    Target  Date 02/29/20      PT SHORT TERM GOAL #3   Title Patient will be able to able to go up and down stairs with holding 5lb dumbell in one hand and rail on other hand with alternating gait.    Baseline able to go up/down with one rail and alternating steps    Time 3    Period Weeks    Status New    Target Date 02/29/20      PT SHORT TERM GOAL #4   Title Patient will be able to walk for 20 min with LRAD to improve walking endurance.    Baseline 5-10 min with 2 [employer]    Time 3    Period Weeks    Status New    Target Date 02/29/20      PT SHORT TERM GOAL #5   Title Pt will be able to perform floor transfers with SBA only    Baseline not attempted    Time 3    Period Weeks    Status New    Target Date 02/29/20             PT Long Term Goals - 02/08/20 1659      PT LONG TERM GOAL #1   Title Pt will be able to ambulate for 45 min without AD to improve walking endurance and return  to PLOF    Baseline 10 min with RW (eval)    Time 6    Period Weeks    Status New    Target Date 03/21/20      PT LONG TERM GOAL #2   Title Pt will be able to go up and down stairs with carrying 5 lb box in both hands to improve stair negotiation    Baseline one rail, alterating steps, doesn't feel safe carrying things (eval)    Period Weeks    Status New    Target Date 03/21/20      PT LONG TERM GOAL #3   Title Pt will demo 22/24 or better on DGI to improve walking balance    Baseline Eval (17/24)    Time 6    Period Weeks    Status New    Target Date 03/21/20                 Plan - 03/02/20 1617    Clinical Impression Statement BP response was stable throughout the session. Patient is demonstrating improving endurance with exercises.    Personal Factors and Comorbidities Comorbidity 3+    Comorbidities PMHx significant for thrombocytopenia, epistaxis, monocytosis, HLD, MI s/p DES, CMML2, myeloblastic syndrome, AML, subdural hematoma    Examination-Activity Limitations Carry;Lift;Stand;Stairs;Squat    Examination-Participation Restrictions Cleaning;Community Activity;Driving;Laundry;Meal Prep;Shop;Yard Work    Stability/Clinical Decision Making Evolving/Moderate complexity    Clinical Decision Making Moderate    Rehab Potential Good    PT Frequency 2x / week    PT Duration 6 weeks    PT Treatment/Interventions ADLs/Self Care Home Management;Gait training;Stair training;Functional mobility training;Therapeutic activities;Therapeutic exercise;Balance training;Neuromuscular re-education;Manual techniques;Patient/family education    PT Next Visit Plan New chemo (new chemo meds) starts 03/13/20- watch vitals carefully           Patient will benefit from skilled therapeutic intervention in order to improve the following deficits and impairments:  Abnormal gait, Decreased balance, Decreased activity tolerance, Decreased mobility, Decreased endurance, Decreased safety  awareness, Decreased strength, Difficulty walking  Visit Diagnosis: Muscle weakness (generalized)  Unsteadiness on feet  Subdural hematoma (  [place])  Balance disorder     Problem List Patient Active Problem List   Diagnosis Date Noted  . Elevated TSH 10/20/2017  . Monocytosis relative  10/20/2017  . Elevated LFTs 12/16/2015  . Testicular nodule vs cyst 12/10/2013  . Visit for preventive health examination 04/29/2012  . Leukopenia 05/14/2011  . Rosacea 05/14/2011  . Unspecified adverse effect of unspecified drug, medicinal and biological substance 10/19/2010  . Thrombocytopenia ([place]) 10/12/2010  . Hyperlipidemia   . HYPOGONADISM 02/05/2010  . Coronary atherosclerosis 06/13/2009  . POSTSURG PERCUT TRANSLUMINAL COR ANGPLSTY STS 06/13/2009  . ERECTILE DYSFUNCTION, MILD 06/29/2008  . HYPERTRIGLYCERIDEMIA 06/23/2007  . HYPERLIPIDEMIA 06/23/2007  . LOW HDL 06/23/2007    Kerrie Pleasure, PT 03/02/2020, 4:19 PM  [facility] [address] [place], [place], [address] Phone: [phone]   Fax:  [phone]  Name: Jon Carter MRN: [id number] Date of Birth: 05-Jun-1951

## 2020-03-07 DIAGNOSIS — D61818 Other pancytopenia: Secondary | ICD-10-CM | POA: Diagnosis not present

## 2020-03-07 DIAGNOSIS — C9200 Acute myeloblastic leukemia, not having achieved remission: Secondary | ICD-10-CM | POA: Diagnosis not present

## 2020-03-08 ENCOUNTER — Other Ambulatory Visit: Payer: Self-pay

## 2020-03-08 ENCOUNTER — Ambulatory Visit: Payer: Medicare PPO | Admitting: Speech Pathology

## 2020-03-08 DIAGNOSIS — M6281 Muscle weakness (generalized): Secondary | ICD-10-CM | POA: Diagnosis not present

## 2020-03-08 DIAGNOSIS — R278 Other lack of coordination: Secondary | ICD-10-CM | POA: Diagnosis not present

## 2020-03-08 DIAGNOSIS — R498 Other voice and resonance disorders: Secondary | ICD-10-CM | POA: Diagnosis not present

## 2020-03-08 DIAGNOSIS — R471 Dysarthria and anarthria: Secondary | ICD-10-CM

## 2020-03-08 DIAGNOSIS — R2681 Unsteadiness on feet: Secondary | ICD-10-CM | POA: Diagnosis not present

## 2020-03-08 DIAGNOSIS — S065X9A Traumatic subdural hemorrhage with loss of consciousness of unspecified duration, initial encounter: Secondary | ICD-10-CM | POA: Diagnosis not present

## 2020-03-08 DIAGNOSIS — R209 Unspecified disturbances of skin sensation: Secondary | ICD-10-CM | POA: Diagnosis not present

## 2020-03-08 DIAGNOSIS — R2689 Other abnormalities of gait and mobility: Secondary | ICD-10-CM | POA: Diagnosis not present

## 2020-03-08 NOTE — Therapy (Signed)
[facility] [address] [place], [place], [address] Phone: [phone]   Fax:  [phone]  Speech Language Pathology Treatment  Patient Details  Name: Jon Carter MRN: [id number] Date of Birth: [birth year] Referring Provider (SLP): Jon Carter   Encounter Date: 03/08/2020   End of Session - 03/08/20 1213    Visit Number 2    Number of Visits 17    Date for SLP Re-Evaluation 04/18/20    SLP Start Time 52    SLP Stop Time  1135    SLP Time Calculation (min) 35 min    Activity Tolerance Treatment limited secondary to medical complications (Comment)           Past Medical History:  Diagnosis Date  . Adjustment disorder with anxiety 06/13/2009   Qualifier: Diagnosis of  By: Jon Bill MD, Jon Carter  related to his sudden MI underwent counseling takes low-dose medication as needed had side effects from an SSRI   . Alcohol addiction ([place])   . Hyperlipidemia    Hx of low HDL zero CAC in 2000 at [place]  . Low testosterone   . STEMI (ST elevation myocardial infarction) ([place]) 04/26/2009   left circumflex DES     Past Surgical History:  Procedure Laterality Date  . CARDIAC CATHETERIZATION  05/02/2009   UA - patent Cfx stent, 50-60% LAD, 50-60% RCA with normal LV funtion (Dr. Adora Carter)  . CATARACT EXTRACTION Left 2009   Jon Carter  . CORONARY ANGIOPLASTY WITH STENT PLACEMENT  04/26/2009   ACS/STEMI - total occlusion of mid Cfx - Xience DES 2.75x104mm (Dr. Corky Carter)  . NM MYOCAR PERF WALL MOTION  04/2012   bruce myoview; mild perfusion defect in basal inferior region (infarct/scar or overlying attenuation); post-stress EF 53%, EKG negative for ischemia, hypertensive response to exercise; abnormal study but unchanged from previous study  . TRANSTHORACIC ECHOCARDIOGRAM  05/2009   EF=50-55%; trace MR; mild TR, normal RSVP; AV mildly sclerotic    There were no vitals filed for this visit.          ADULT SLP TREATMENT  - 03/08/20 1157      General Information   Behavior/Cognition Alert;Cooperative;Pleasant mood      Treatment Provided   Treatment provided Cognitive-Linquistic      Cognitive-Linquistic Treatment   Treatment focused on Voice;Dysarthria;Patient/family/caregiver education    Skilled Treatment [name] brought in respiratory muscle trainers. He required increase in pressure. Pressure increase to effor level of 5/10. He has not been using them at home. Instructed him to use RMT's twice daily. Initiated training in abdominal breathing to project voice at sentence leve to improve volume and voice quality with usual min to mod A. Trained spouse in cueing pt for breath support and volume. In conversation, [name] required consistent cues for volume and breath support      Assessment / Recommendations / Plan   Plan Continue with current plan of care      Progression Toward Goals   Progression toward goals Progressing toward goals            SLP Education - 03/08/20 1203    Education Details HEP for voice            SLP Short Term Goals - 03/08/20 1211      SLP SHORT TERM GOAL #1   Title Pt will complete HEP for dysarthria and dysphonia with rare min A over 2 sessions    Time 8  Period Weeks    Status On-going      SLP SHORT TERM GOAL #2   Title Pt will follow 3 aspects of vocal hygiene program over 3 sesions with rare min A    Time 4    Period Weeks    Status On-going      SLP SHORT TERM GOAL #3   Title Pt will demonstrate adequate breath support and volume of 70dB on structured sentences 18/20 with mod I    Time 4    Period Weeks    Status On-going      SLP SHORT TERM GOAL #4   Title Pt will follow swallow precautions with rare min A over 2 sessions    Time 4    Period Weeks    Status On-going            SLP Long Term Goals - 03/08/20 1212      SLP LONG TERM GOAL #1   Title Pt will tolerate regluar diet and thin liquids with not over s/s of apsiration    Time 8     Period Weeks    Status On-going      SLP LONG TERM GOAL #2   Title Pt will utilize abdominal breathing and average 70dB in 15 minute conversation with rare min A    Time 8    Period Weeks    Status New      SLP LONG TERM GOAL #3   Title Pt will complete HEP for voice with mod I over 4 sessions    Baseline may be modified after ENT consult tomorrow    Time 8    Period Weeks    Status On-going      SLP LONG TERM GOAL #4   Title Pt will demonstrate clear phonation over 20 minute conversation with rare min A    Time 8    Period Weeks    Status On-going            Plan - 03/08/20 1203    Clinical Impression Statement Ongoing training in HEP and strategies for dysphonia and dysphagia. 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 Spouse and pt deny any cognitive changes or memory changes. Continue skilled ST to maximize intelligilbity, Goals may be modified as ENT consult completed.    Speech Therapy Frequency 2x / week    Duration --   8 weeks or 17 visits   Treatment/Interventions Aspiration precaution training;Environmental controls;Cueing hierarchy;Oral motor exercises;SLP instruction and feedback;Compensatory strategies;Functional tasks;Cognitive reorganization;Compensatory techniques;Pharyngeal strengthening exercises;Diet toleration management by SLP;Trials of upgraded texture/liquids;Internal/external aids;Patient/family education    Potential to Achieve Goals Good    Potential Considerations Co-morbidities           Patient will benefit from skilled therapeutic intervention in order to improve the following deficits and impairments:   Dysarthria and anarthria  Other voice and resonance disorders    Problem List Patient Active Problem List   Diagnosis Date Noted  . Elevated TSH 10/20/2017  . Monocytosis relative  10/20/2017  . Elevated LFTs 12/16/2015  . Testicular  nodule vs cyst 12/10/2013  . Visit for preventive health examination 04/29/2012  . Leukopenia 05/14/2011  . Rosacea 05/14/2011  . Unspecified adverse effect of unspecified drug, medicinal and biological substance 10/19/2010  . Thrombocytopenia ([place]) 10/12/2010  . Hyperlipidemia   . HYPOGONADISM 02/05/2010  . Coronary atherosclerosis 06/13/2009  . POSTSURG PERCUT TRANSLUMINAL COR ANGPLSTY STS 06/13/2009  .  ERECTILE DYSFUNCTION, MILD 06/29/2008  . HYPERTRIGLYCERIDEMIA 06/23/2007  . HYPERLIPIDEMIA 06/23/2007  . LOW HDL 06/23/2007    Ichelle Harral, Annye Rusk  MS, CCC-SLP 03/08/2020, 12:14 PM  [facility] [address] [place], [place], [address] Phone: [phone]   Fax:  [phone]   Name: Jon Carter MRN: [id number] Date of Birth: 17-Jun-1951

## 2020-03-08 NOTE — Patient Instructions (Signed)
   ABDOMINAL BREATHING   . Shoulders down - this is a cue to relax . Place your hand on your abdomen - this helps you focus on easy abdominal breath support - the best and most relaxed way to breathe . Breathe in  and fill your belly with air, watching your hand move outward . Breathe out  and watch your belly move in.   Think of your belly as a balloon, when you fill with air (inhale), the balloon gets bigger. As the air goes out (exhale), the balloon deflates.  If you are having difficulty coordinating this, lay on your back with a plastic cup on your belly and repeat the above steps, watching you belly move up with inhalation and down with exhalations  Practice breathing in and out in front of a mirror, watching your belly Breathe in for a count of 5 and breathe out for a count of 5   Practice this throughout the day. For example: in the car, when watching TV, before medications. Regular practice when you are feeling well is important.  Practice coordinating belly breath with speaking  Read each sentence with a belly breath and focus on volume 15 twice a day  When you are talking, be mindful of any strain in your throat to be heard - if you feel this, take a belly breath to generate volume  Keep up the sucky blowly 3x a day - just whenever, during commercials etc   Avoid taking liquids and solids together       Provided by: Georgiann Hahn. SLP [phone]

## 2020-03-09 DIAGNOSIS — Z9889 Other specified postprocedural states: Secondary | ICD-10-CM | POA: Diagnosis not present

## 2020-03-09 DIAGNOSIS — J90 Pleural effusion, not elsewhere classified: Secondary | ICD-10-CM | POA: Diagnosis not present

## 2020-03-09 DIAGNOSIS — Z79899 Other long term (current) drug therapy: Secondary | ICD-10-CM | POA: Diagnosis not present

## 2020-03-09 DIAGNOSIS — L988 Other specified disorders of the skin and subcutaneous tissue: Secondary | ICD-10-CM | POA: Diagnosis not present

## 2020-03-09 DIAGNOSIS — L89302 Pressure ulcer of unspecified buttock, stage 2: Secondary | ICD-10-CM | POA: Diagnosis not present

## 2020-03-09 DIAGNOSIS — D709 Neutropenia, unspecified: Secondary | ICD-10-CM | POA: Diagnosis not present

## 2020-03-09 DIAGNOSIS — C9200 Acute myeloblastic leukemia, not having achieved remission: Secondary | ICD-10-CM | POA: Diagnosis not present

## 2020-03-09 DIAGNOSIS — C9480 Other specified leukemias not having achieved remission: Secondary | ICD-10-CM | POA: Diagnosis not present

## 2020-03-09 DIAGNOSIS — E785 Hyperlipidemia, unspecified: Secondary | ICD-10-CM | POA: Diagnosis not present

## 2020-03-09 DIAGNOSIS — R1310 Dysphagia, unspecified: Secondary | ICD-10-CM | POA: Diagnosis not present

## 2020-03-13 DIAGNOSIS — Z5111 Encounter for antineoplastic chemotherapy: Secondary | ICD-10-CM | POA: Diagnosis not present

## 2020-03-13 DIAGNOSIS — C9200 Acute myeloblastic leukemia, not having achieved remission: Secondary | ICD-10-CM | POA: Diagnosis not present

## 2020-03-14 DIAGNOSIS — C9200 Acute myeloblastic leukemia, not having achieved remission: Secondary | ICD-10-CM | POA: Diagnosis not present

## 2020-03-14 DIAGNOSIS — Z5111 Encounter for antineoplastic chemotherapy: Secondary | ICD-10-CM | POA: Diagnosis not present

## 2020-03-15 ENCOUNTER — Encounter: Payer: Medicare PPO | Admitting: Occupational Therapy

## 2020-03-15 DIAGNOSIS — C9200 Acute myeloblastic leukemia, not having achieved remission: Secondary | ICD-10-CM | POA: Diagnosis not present

## 2020-03-15 DIAGNOSIS — Z5111 Encounter for antineoplastic chemotherapy: Secondary | ICD-10-CM | POA: Diagnosis not present

## 2020-03-16 DIAGNOSIS — C9200 Acute myeloblastic leukemia, not having achieved remission: Secondary | ICD-10-CM | POA: Diagnosis not present

## 2020-03-16 DIAGNOSIS — Z5111 Encounter for antineoplastic chemotherapy: Secondary | ICD-10-CM | POA: Diagnosis not present

## 2020-03-17 DIAGNOSIS — C9200 Acute myeloblastic leukemia, not having achieved remission: Secondary | ICD-10-CM | POA: Diagnosis not present

## 2020-03-17 DIAGNOSIS — Z5111 Encounter for antineoplastic chemotherapy: Secondary | ICD-10-CM | POA: Diagnosis not present

## 2020-03-18 DIAGNOSIS — Z5111 Encounter for antineoplastic chemotherapy: Secondary | ICD-10-CM | POA: Diagnosis not present

## 2020-03-18 DIAGNOSIS — C9200 Acute myeloblastic leukemia, not having achieved remission: Secondary | ICD-10-CM | POA: Diagnosis not present

## 2020-03-20 DIAGNOSIS — C9200 Acute myeloblastic leukemia, not having achieved remission: Secondary | ICD-10-CM | POA: Diagnosis not present

## 2020-03-20 DIAGNOSIS — Z5111 Encounter for antineoplastic chemotherapy: Secondary | ICD-10-CM | POA: Diagnosis not present

## 2020-03-21 ENCOUNTER — Ambulatory Visit: Payer: Medicare PPO | Admitting: Occupational Therapy

## 2020-03-21 DIAGNOSIS — C9200 Acute myeloblastic leukemia, not having achieved remission: Secondary | ICD-10-CM | POA: Diagnosis not present

## 2020-03-21 DIAGNOSIS — Z5111 Encounter for antineoplastic chemotherapy: Secondary | ICD-10-CM | POA: Diagnosis not present

## 2020-03-22 ENCOUNTER — Ambulatory Visit: Payer: Medicare PPO | Admitting: Speech Pathology

## 2020-03-22 DIAGNOSIS — C9200 Acute myeloblastic leukemia, not having achieved remission: Secondary | ICD-10-CM | POA: Diagnosis not present

## 2020-03-22 DIAGNOSIS — Z5111 Encounter for antineoplastic chemotherapy: Secondary | ICD-10-CM | POA: Diagnosis not present

## 2020-03-23 ENCOUNTER — Encounter: Payer: Medicare PPO | Admitting: Occupational Therapy

## 2020-03-23 DIAGNOSIS — Z5111 Encounter for antineoplastic chemotherapy: Secondary | ICD-10-CM | POA: Diagnosis not present

## 2020-03-23 DIAGNOSIS — C9200 Acute myeloblastic leukemia, not having achieved remission: Secondary | ICD-10-CM | POA: Diagnosis not present

## 2020-03-24 ENCOUNTER — Ambulatory Visit: Payer: Medicare PPO

## 2020-03-27 ENCOUNTER — Ambulatory Visit: Payer: Medicare PPO

## 2020-03-27 DIAGNOSIS — C9200 Acute myeloblastic leukemia, not having achieved remission: Secondary | ICD-10-CM | POA: Diagnosis not present

## 2020-03-28 ENCOUNTER — Ambulatory Visit: Payer: Medicare PPO

## 2020-03-28 ENCOUNTER — Ambulatory Visit: Payer: Medicare PPO | Admitting: Occupational Therapy

## 2020-03-28 ENCOUNTER — Other Ambulatory Visit: Payer: Self-pay

## 2020-03-28 DIAGNOSIS — S065X9A Traumatic subdural hemorrhage with loss of consciousness of unspecified duration, initial encounter: Secondary | ICD-10-CM | POA: Diagnosis not present

## 2020-03-28 DIAGNOSIS — R2689 Other abnormalities of gait and mobility: Secondary | ICD-10-CM

## 2020-03-28 DIAGNOSIS — M6281 Muscle weakness (generalized): Secondary | ICD-10-CM | POA: Diagnosis not present

## 2020-03-28 DIAGNOSIS — R2681 Unsteadiness on feet: Secondary | ICD-10-CM

## 2020-03-28 DIAGNOSIS — R208 Other disturbances of skin sensation: Secondary | ICD-10-CM

## 2020-03-28 DIAGNOSIS — R498 Other voice and resonance disorders: Secondary | ICD-10-CM | POA: Diagnosis not present

## 2020-03-28 DIAGNOSIS — S065XAA Traumatic subdural hemorrhage with loss of consciousness status unknown, initial encounter: Secondary | ICD-10-CM

## 2020-03-28 DIAGNOSIS — R278 Other lack of coordination: Secondary | ICD-10-CM | POA: Diagnosis not present

## 2020-03-28 DIAGNOSIS — R209 Unspecified disturbances of skin sensation: Secondary | ICD-10-CM | POA: Diagnosis not present

## 2020-03-28 DIAGNOSIS — R471 Dysarthria and anarthria: Secondary | ICD-10-CM | POA: Diagnosis not present

## 2020-03-28 NOTE — Therapy (Signed)
[facility] [address] [place], [place], [address] Phone: [phone]   Fax:  [phone]  Physical Therapy Recertification  Patient Details  Name: Jon Carter MRN: [id number] Date of Birth: [birth year] Referring Provider (PT): Dr. Levada Schilling   Encounter Date: 03/28/2020   PT End of Session - 03/28/20 1746    Visit Number 9    Number of Visits 13    Date for PT Re-Evaluation 03/21/20    Authorization Type Eval 02/08/20; Recertification 0/10/93    PT Start Time 1700    PT Stop Time 1745    PT Time Calculation (min) 45 min    Equipment Utilized During Treatment Gait belt    Activity Tolerance Treatment limited secondary to medical complications (Comment)    Behavior During Therapy [facility] for tasks assessed/performed           Past Medical History:  Diagnosis Date  . Adjustment disorder with anxiety 06/13/2009   Qualifier: Diagnosis of  By: Regis Bill MD, Standley Brooking  related to his sudden MI underwent counseling takes low-dose medication as needed had side effects from an SSRI   . Alcohol addiction ([place])   . Hyperlipidemia    Hx of low HDL zero CAC in 2000 at [facility]  . Low testosterone   . STEMI (ST elevation myocardial infarction) ([place]) 04/26/2009   left circumflex DES     Past Surgical History:  Procedure Laterality Date  . CARDIAC CATHETERIZATION  05/02/2009   UA - patent Cfx stent, 50-60% LAD, 50-60% RCA with normal LV funtion (Dr. Adora Fridge)  . CATARACT EXTRACTION Left 2009   [facility]  . CORONARY ANGIOPLASTY WITH STENT PLACEMENT  04/26/2009   ACS/STEMI - total occlusion of mid Cfx - Xience DES 2.75x58mm (Dr. Corky Downs)  . NM MYOCAR PERF WALL MOTION  04/2012   bruce myoview; mild perfusion defect in basal inferior region (infarct/scar or overlying attenuation); post-stress EF 53%, EKG negative for ischemia, hypertensive response to exercise; abnormal study but unchanged from previous study  . TRANSTHORACIC  ECHOCARDIOGRAM  05/2009   EF=50-55%; trace MR; mild TR, normal RSVP; AV mildly sclerotic    There were no vitals filed for this visit.   Subjective Assessment - 03/28/20 1705    Subjective Pt reports his chemo treatment ended last Thursday. Pt reports he is feeling little weak but chemo helped to clear up lot skin lesions.    Patient is accompained by: Family member    Pertinent History PMHx significant for thrombocytopenia, epistaxis, monocytosis, HLD, MI s/p DES, CMML2, myeloblastic syndrome, AML, subdural hematoma    Limitations Standing;Walking;House hold activities    How long can you sit comfortably? no limits    How long can you stand comfortably? 10 min    How long can you walk comfortably? 5-10 min    Diagnostic tests 1. No evidence of acute infarct on this moderately motion degraded exam.2. Similar size of multiloculated subdural hematohygromas along the bilateral cerebral convexities with trace leftward midline shift and partial effacement of the ventricular system.3. Confluent T2 FLAIR hyperintensity in the dorsal pons is nonspecific but may reflect sequelae of subacute versus chronic insult.4. Multifocal linear susceptibility artifact within the dorsal midbrain and midline pons which may reflect underlying vascular lesion and/or petechial hemorrhage.    Patient Stated Goals walk by myself without AD for 45 min, be able to go up the stairs to second floor, get back to normal  [facility] PT Assessment - 03/29/20 1254      Standardized Balance Assessment   Five times sit to stand comments  23      Berg Balance Test   Sit to Stand Able to stand without using hands and stabilize independently    Standing Unsupported Able to stand safely 2 minutes    Sitting with Back Unsupported but Feet Supported on Floor or Stool Able to sit safely and securely 2 minutes    Stand to Sit Sits safely with minimal use of hands    Transfers Able to transfer safely, minor use of hands     Standing Unsupported with Eyes Closed Able to stand 10 seconds safely    Standing Unsupported with Feet Together Able to place feet together independently and stand 1 minute safely    From Standing, Reach Forward with Outstretched Arm Can reach confidently >25 cm (10")    From Standing Position, Pick up Object from Floor Able to pick up shoe safely and easily    From Standing Position, Turn to Look Behind Over each Shoulder Looks behind from both sides and weight shifts well    Turn 360 Degrees Able to turn 360 degrees safely in 4 seconds or less    Standing Unsupported, Alternately Place Feet on Step/Stool Able to stand independently and safely and complete 8 steps in 20 seconds    Standing Unsupported, One Foot in Front Able to place foot tandem independently and hold 30 seconds    Standing on One Leg Able to lift leg independently and hold > 10 seconds    Total Score 56      Functional Gait  Assessment   Gait assessed  Yes    Gait Level Surface Walks 20 ft, slow speed, abnormal gait pattern, evidence for imbalance or deviates 10-15 in outside of the 12 in walkway width. Requires more than 7 sec to ambulate 20 ft.    Change in Gait Speed Able to change speed, demonstrates mild gait deviations, deviates 6-10 in outside of the 12 in walkway width, or no gait deviations, unable to achieve a major change in velocity, or uses a change in velocity, or uses an assistive device.    Gait with Horizontal Head Turns Performs head turns with moderate changes in gait velocity, slows down, deviates 10-15 in outside 12 in walkway width but recovers, can continue to walk.    Gait with Vertical Head Turns Performs task with slight change in gait velocity (eg, minor disruption to smooth gait path), deviates 6 - 10 in outside 12 in walkway width or uses assistive device    Gait and Pivot Turn Pivot turns safely within 3 sec and stops quickly with no loss of balance.    Step Over Obstacle Is able to step over 2  stacked shoe boxes taped together (9 in total height) without changing gait speed. No evidence of imbalance.    Gait with Narrow Base of Support Is able to ambulate for 10 steps heel to toe with no staggering.    Gait with Eyes Closed Walks 20 ft, uses assistive device, slower speed, mild gait deviations, deviates 6-10 in outside 12 in walkway width. Ambulates 20 ft in less than 9 sec but greater than 7 sec.    Ambulating Backwards Walks 20 ft, uses assistive device, slower speed, mild gait deviations, deviates 6-10 in outside 12 in walkway width.    Steps Alternating feet, must use rail.    Total Score 21  5x sit to stand: 23 seconds without HHA Stair training: 5lb in one hand and using rail on R going up FGA performed: 21/30 BBS performed 56/56 Turning 360 deg in 2 x 2 square: 4x with EO, 1x with EC                       PT Short Term Goals - 03/28/20 1709      PT SHORT TERM GOAL #1   Title Patient will be able to ambulate 1000' without AD on level ground to improve walking endurance    Baseline ambulates with RW for 5'    Time 3    Period Weeks    Status New    Target Date 02/29/20      PT SHORT TERM GOAL #2   Title Patient will be able to perform 5 sit to stands under 20 seconds to improve functional strength    Baseline 28 sec (eval); 23 sec (03/28/20)    Time 4    Period Weeks    Status Revised    Target Date 02/29/20      PT SHORT TERM GOAL #3   Title Patient will be able to able to go up and down stairs with holding 5lb dumbell in one hand and rail on other hand with alternating gait.    Baseline able to go up/down with one rail and alternating steps; able to perform 12 steps with carrying 5lb in hand and one rail to mimich stair/rail at home (03/28/20)    Time 3    Period Weeks    Status Achieved    Target Date 02/29/20      PT SHORT TERM GOAL #4   Title Patient will be able to walk for 20 min with LRAD to improve walking endurance.     Baseline 5-10 min with 2 [employer]    Time 3    Period Weeks    Status New    Target Date 02/29/20      PT SHORT TERM GOAL #5   Title Pt will be able to perform floor transfers with SBA only    Baseline not attempted    Time 3    Period Weeks    Status New    Target Date 02/29/20             PT Long Term Goals - 03/29/20 1256      PT LONG TERM GOAL #1   Title Pt will be able to ambulate for 45 min without AD to improve walking endurance and return to PLOF    Baseline 10 min with RW (eval); 20 min with RW (03/29/20)    Time 8    Period Weeks    Status Revised    Target Date 05/24/20      PT LONG TERM GOAL #2   Title Pt will be able to go up and down stairs with carrying 5 lb box in both hands to improve stair negotiation    Baseline one rail, alterating steps, doesn't feel safe carrying things (eval); 5lbs one rail (03/29/20)    Period Weeks    Status Achieved      PT LONG TERM GOAL #3   Title Patient will score 25/30 or better Functiona gait assessment to improve overall gait    Baseline 21/30 (03/28/20)    Time 6    Period Weeks    Status Revised  Plan - 03/29/20 1253    Clinical Impression Statement Patient has been seen for total of 9 sessions from 02/08/20 to 03/29/20 for generalized weakness and gait and mobility disorder. Patient has demonstrated improvement in Berg balance scale and 5x sit to stand score. Patient's 5x sit to stand score is still not within age rlated norms and patient has potential for further improvement. Patient's overall progress is slower due to compliancations of chemo treatments. patient will continue to benefit from skilled PT to address these impairments and improve overall function.    Personal Factors and Comorbidities Comorbidity 3+    Comorbidities PMHx significant for thrombocytopenia, epistaxis, monocytosis, HLD, MI s/p DES, CMML2, myeloblastic syndrome, AML, subdural hematoma    Examination-Activity Limitations  Carry;Lift;Stand;Stairs;Squat    Examination-Participation Restrictions Cleaning;Community Activity;Driving;Laundry;Meal Prep;Shop;Yard Work    Stability/Clinical Decision Making Evolving/Moderate complexity    Rehab Potential Good    PT Frequency 2x / week    PT Duration 8 weeks    PT Treatment/Interventions ADLs/Self Care Home Management;Gait training;Stair training;Functional mobility training;Therapeutic activities;Therapeutic exercise;Balance training;Neuromuscular re-education;Manual techniques;Patient/family education    PT Next Visit Plan New chemo (new chemo meds) starts 03/13/20- watch vitals carefully           Patient will benefit from skilled therapeutic intervention in order to improve the following deficits and impairments:  Abnormal gait, Decreased balance, Decreased activity tolerance, Decreased mobility, Decreased endurance, Decreased safety awareness, Decreased strength, Difficulty walking  Visit Diagnosis: Muscle weakness (generalized)  Unsteadiness on feet  Subdural hematoma ([facility])  Balance disorder     Problem List Patient Active Problem List   Diagnosis Date Noted  . Elevated TSH 10/20/2017  . Monocytosis relative  10/20/2017  . Elevated LFTs 12/16/2015  . Testicular nodule vs cyst 12/10/2013  . Visit for preventive health examination 04/29/2012  . Leukopenia 05/14/2011  . Rosacea 05/14/2011  . Unspecified adverse effect of unspecified drug, medicinal and biological substance 10/19/2010  . Thrombocytopenia ([place]) 10/12/2010  . Hyperlipidemia   . HYPOGONADISM 02/05/2010  . Coronary atherosclerosis 06/13/2009  . POSTSURG PERCUT TRANSLUMINAL COR ANGPLSTY STS 06/13/2009  . ERECTILE DYSFUNCTION, MILD 06/29/2008  . HYPERTRIGLYCERIDEMIA 06/23/2007  . HYPERLIPIDEMIA 06/23/2007  . LOW HDL 06/23/2007    Kerrie Pleasure 03/29/2020, 12:59 PM  [facility] [address] [place], [place],  [address] Phone: [phone]   Fax:  [phone]  Name: Jon Carter MRN: [id number] Date of Birth: 1951-07-16

## 2020-03-29 ENCOUNTER — Encounter: Payer: Self-pay | Admitting: Occupational Therapy

## 2020-03-29 NOTE — Therapy (Signed)
[facility] [address] [place], [place], [address] Phone: [phone]   Fax:  [phone]  Occupational Therapy Treatment  Patient Details  Name: Jon Carter MRN: [id number] Date of Birth: 1951-02-11 Referring Provider (OT): Levada Schilling   Encounter Date: 03/28/2020   OT End of Session - 03/29/20 0701    Visit Number 2    Number of Visits 9    Date for OT Re-Evaluation 04/02/20    Authorization Type [employer] and [employer]    Progress Note Due on Visit 10    OT Start Time 1752    OT Stop Time 1830    OT Time Calculation (min) 38 min    Activity Tolerance Patient tolerated treatment well    Behavior During Therapy [facility] for tasks assessed/performed           Past Medical History:  Diagnosis Date  . Adjustment disorder with anxiety 06/13/2009   Qualifier: Diagnosis of  By: Regis Bill MD, [place] Brooking  related to his sudden MI underwent counseling takes low-dose medication as needed had side effects from an SSRI   . Alcohol addiction ([place])   . Hyperlipidemia    Hx of low HDL zero CAC in 2000 at [place]  . Low testosterone   . STEMI (ST elevation myocardial infarction) ([place]) 04/26/2009   left circumflex DES     Past Surgical History:  Procedure Laterality Date  . CARDIAC CATHETERIZATION  05/02/2009   UA - patent Cfx stent, 50-60% LAD, 50-60% RCA with normal LV funtion (Dr. Adora Fridge)  . CATARACT EXTRACTION Left 2009   [place]  . CORONARY ANGIOPLASTY WITH STENT PLACEMENT  04/26/2009   ACS/STEMI - total occlusion of mid Cfx - Xience DES 2.75x57mm (Dr. Corky Downs)  . NM MYOCAR PERF WALL MOTION  04/2012   bruce myoview; mild perfusion defect in basal inferior region (infarct/scar or overlying attenuation); post-stress EF 53%, EKG negative for ischemia, hypertensive response to exercise; abnormal study but unchanged from previous study  . TRANSTHORACIC ECHOCARDIOGRAM  05/2009   EF=50-55%; trace MR; mild TR, normal RSVP;  AV mildly sclerotic    There were no vitals filed for this visit.   Subjective Assessment - 03/29/20 0652    Subjective  Patient indicates he stands 10-15 min at a time to complete ADL's as needed    Patient is accompanied by: Family member    Multiple Pain Sites No              [facility] OT Assessment - 03/29/20 0001      Hand Function   Right Hand Gross Grasp Impaired    Right Hand Grip (lbs) 20.7    Left Hand Gross Grasp Impaired    Left Hand Grip (lbs) 20.5                    OT Treatments/Exercises (OP) - 03/29/20 0001      ADLs   ADL Comments Patient and wife report, he just completed a round of chemo.  Patient walking in home without device - uses rollator more for community mobility.        Exercises   Exercises Theraputty;Shoulder      Shoulder Exercises: Supine   Other Supine Exercises Bilateral ball exercises to begin to address limitations in shoulder range of motion       Theraputty   Theraputty - Flatten Red putty issued, and exercises given in handout - internet connection disrupted this session.  Wife present. Patient able to return demonstrate exercises.      Theraputty - Roll red    Theraputty - Grip red    Theraputty - Pinch red    Theraputty Hand- Locate Pegs bilateral - pull      Manual Therapy   Manual therapy comments Gentle prolonged stretch to B Shoulders to increase shoulder flexion and abduction.  Patient with report of discomfort 4/10 with flexion beyound 110 degrees.  Joint stiffness present.                    OT Education - 03/29/20 0700    Education Details Initiated therapy putty, and supine shoulder exercises to increase range of motion    Person(s) Educated Patient;Spouse    Methods Explanation;Demonstration;Tactile cues;Verbal cues    Comprehension Verbalized understanding;Returned demonstration               OT Long Term Goals - 02/17/20 1559      OT LONG TERM GOAL #1   Title Patient will independently  complete a home exercise program designed to improve coordination in BUE    Time 4    Period Weeks    Status New    Target Date 04/02/20      OT LONG TERM GOAL #2   Title Patient will independently complete a home exercise program designed to improve grip and pinch strength in BUE    Time 4    Period Weeks    Status New      OT LONG TERM GOAL #3   Title Patient will demonstrate active range of motion to at least 120* shoulder flexion bilaterally to aide with reaching to obtain lightweight object from overhead shelf    Baseline 105 left, 110 right    Time 4    Period Weeks    Status New      OT LONG TERM GOAL #4   Title Patient will demonstrate an 8-10 lb increase in grip strength in BUE to assist with opening new food packages, or bottles    Time 4    Period Weeks    Status New      OT LONG TERM GOAL #5   Title Patient will complete bathing and dressing with modified independence    Time 4    Period Weeks    Status New      Long Term Additional Goals   Additional Long Term Goals Yes      OT LONG TERM GOAL #6   Title Patient will demonstrate understanding of driving recommendations    Time 4    Period Weeks    Status New      OT LONG TERM GOAL #7   Title Patient will return to community exercise program with close supervision    Time 4    Period Weeks    Status New                 Plan - 03/29/20 0701    Clinical Impression Statement Patient continues to undergo chemotherapy.  Patient had significant delay from evaluation to initiation of OT services, however, seems to be progressing with general functional mobility and independence with ADL.    OT Frequency 2x / week    OT Duration 4 weeks    OT Treatment/Interventions Self-care/ADL training;Therapeutic exercise;Patient/family education;Neuromuscular education;Therapist, nutritional;Therapeutic activities;Balance training;Manual Therapy;DME and/or AE instruction    Plan Continue hand strengthening -  gripper , continue work to increase shoulder range for  flexion and abd    OT Home Exercise Plan Initiated putty - red (grasp, roll, pinch, flatten, bilateral pull)  Initiated supine shoulder stretch for shoulders - bilateral with ball    Consulted and Agree with Plan of Care Patient;Family member/caregiver    Family Member Consulted wife           Patient will benefit from skilled therapeutic intervention in order to improve the following deficits and impairments:           Visit Diagnosis: Muscle weakness (generalized)  Unsteadiness on feet  Other disturbances of skin sensation  Other lack of coordination    Problem List Patient Active Problem List   Diagnosis Date Noted  . Elevated TSH 10/20/2017  . Monocytosis relative  10/20/2017  . Elevated LFTs 12/16/2015  . Testicular nodule vs cyst 12/10/2013  . Visit for preventive health examination 04/29/2012  . Leukopenia 05/14/2011  . Rosacea 05/14/2011  . Unspecified adverse effect of unspecified drug, medicinal and biological substance 10/19/2010  . Thrombocytopenia ([place]) 10/12/2010  . Hyperlipidemia   . HYPOGONADISM 02/05/2010  . Coronary atherosclerosis 06/13/2009  . POSTSURG PERCUT TRANSLUMINAL COR ANGPLSTY STS 06/13/2009  . ERECTILE DYSFUNCTION, MILD 06/29/2008  . HYPERTRIGLYCERIDEMIA 06/23/2007  . HYPERLIPIDEMIA 06/23/2007  . LOW HDL 06/23/2007    Mariah Milling, OTR/L 03/29/2020, 7:07 AM  [facility] [address] [place], [place], [address] Phone: [phone]   Fax:  [phone]  Name: Jon Carter MRN: [id number] Date of Birth: December 15, 1950

## 2020-03-30 ENCOUNTER — Encounter: Payer: Medicare PPO | Admitting: Occupational Therapy

## 2020-03-30 DIAGNOSIS — C9200 Acute myeloblastic leukemia, not having achieved remission: Secondary | ICD-10-CM | POA: Diagnosis not present

## 2020-03-31 ENCOUNTER — Ambulatory Visit: Payer: Medicare PPO

## 2020-03-31 ENCOUNTER — Other Ambulatory Visit: Payer: Self-pay

## 2020-03-31 DIAGNOSIS — M6281 Muscle weakness (generalized): Secondary | ICD-10-CM | POA: Diagnosis not present

## 2020-03-31 DIAGNOSIS — R471 Dysarthria and anarthria: Secondary | ICD-10-CM | POA: Diagnosis not present

## 2020-03-31 DIAGNOSIS — R2689 Other abnormalities of gait and mobility: Secondary | ICD-10-CM | POA: Diagnosis not present

## 2020-03-31 DIAGNOSIS — R2681 Unsteadiness on feet: Secondary | ICD-10-CM | POA: Diagnosis not present

## 2020-03-31 DIAGNOSIS — R498 Other voice and resonance disorders: Secondary | ICD-10-CM | POA: Diagnosis not present

## 2020-03-31 DIAGNOSIS — S065X9A Traumatic subdural hemorrhage with loss of consciousness of unspecified duration, initial encounter: Secondary | ICD-10-CM | POA: Diagnosis not present

## 2020-03-31 DIAGNOSIS — R209 Unspecified disturbances of skin sensation: Secondary | ICD-10-CM | POA: Diagnosis not present

## 2020-03-31 DIAGNOSIS — S065XAA Traumatic subdural hemorrhage with loss of consciousness status unknown, initial encounter: Secondary | ICD-10-CM

## 2020-03-31 DIAGNOSIS — R278 Other lack of coordination: Secondary | ICD-10-CM | POA: Diagnosis not present

## 2020-03-31 NOTE — Therapy (Signed)
[facility] [address] [place], [place], [address] Phone: [phone]   Fax:  [phone]  Physical Therapy Treatment  Patient Details  Name: Jon Carter MRN: [id number] Date of Birth: 1951-07-16 Referring Provider (PT): Dr. Levada Schilling   Encounter Date: 03/31/2020   PT End of Session - 03/31/20 1439    Visit Number 10    Number of Visits 13    Date for PT Re-Evaluation 03/21/20    Authorization Type Eval 02/08/20; Recertification 9/67/89    PT Start Time 1400    PT Stop Time 1440    PT Time Calculation (min) 40 min    Equipment Utilized During Treatment Gait belt    Activity Tolerance Treatment limited secondary to medical complications (Comment)    Behavior During Therapy [facility] for tasks assessed/performed           Past Medical History:  Diagnosis Date  . Adjustment disorder with anxiety 06/13/2009   Qualifier: Diagnosis of  By: Regis Bill MD, Standley Brooking  related to his sudden MI underwent counseling takes low-dose medication as needed had side effects from an SSRI   . Alcohol addiction ([place])   . Hyperlipidemia    Hx of low HDL zero CAC in 2000 at [facility]  . Low testosterone   . STEMI (ST elevation myocardial infarction) ([place]) 04/26/2009   left circumflex DES     Past Surgical History:  Procedure Laterality Date  . CARDIAC CATHETERIZATION  05/02/2009   UA - patent Cfx stent, 50-60% LAD, 50-60% RCA with normal LV funtion (Dr. Adora Fridge)  . CATARACT EXTRACTION Left 2009   [place]  . CORONARY ANGIOPLASTY WITH STENT PLACEMENT  04/26/2009   ACS/STEMI - total occlusion of mid Cfx - Xience DES 2.75x77mm (Dr. Corky Downs)  . NM MYOCAR PERF WALL MOTION  04/2012   bruce myoview; mild perfusion defect in basal inferior region (infarct/scar or overlying attenuation); post-stress EF 53%, EKG negative for ischemia, hypertensive response to exercise; abnormal study but unchanged from previous study  . TRANSTHORACIC  ECHOCARDIOGRAM  05/2009   EF=50-55%; trace MR; mild TR, normal RSVP; AV mildly sclerotic    There were no vitals filed for this visit.   Subjective Assessment - 03/31/20 1412    Subjective Wife reports his Hgb was 7.5 yesterday.    Patient is accompained by: Family member    Pertinent History PMHx significant for thrombocytopenia, epistaxis, monocytosis, HLD, MI s/p DES, CMML2, myeloblastic syndrome, AML, subdural hematoma    Limitations Standing;Walking;House hold activities    How long can you sit comfortably? no limits    How long can you stand comfortably? 10 min    How long can you walk comfortably? 5-10 min    Diagnostic tests 1. No evidence of acute infarct on this moderately motion degraded exam.2. Similar size of multiloculated subdural hematohygromas along the bilateral cerebral convexities with trace leftward midline shift and partial effacement of the ventricular system.3. Confluent T2 FLAIR hyperintensity in the dorsal pons is nonspecific but may reflect sequelae of subacute versus chronic insult.4. Multifocal linear susceptibility artifact within the dorsal midbrain and midline pons which may reflect underlying vascular lesion and/or petechial hemorrhage.    Patient Stated Goals walk by myself without AD for 45 min, be able to go up the stairs to second floor, get back to normal    Currently in Pain? No/denies           Hgb reviewed on care everywhere and it was  7.5 at [facility] hospital yesterday Resting: BP: 111/75, HR 105 Sit to stand: 2 x 10, BP after sit to stand: 111/69 Tandem walking fwd and bwd: 2 x 20 feet Karaoke walking: 2 laps 20x feet R and L Tandem stance with EC: 2 x 30" R and L, cues to improve stepping strategy and reach with his feet quicker. BP: 106/75, HR 113 after tandem stance with EC Vitals after 5': 98/74, HR 112 Pt was laid down in supine with legs elevated to improve BP to 107/67 in supine After 1 min of sitting: 91/59, HR 111bpm After 5 min of  sitting: 101/64, 110 bpm Standing after 30 sec 99/64, HR 123 Sitting after 2 min 97/63, HR 110  Pt's symptoms were stable and wife was educated to monitor his BP at home.                         PT Short Term Goals - 03/28/20 1709      PT SHORT TERM GOAL #1   Title Patient will be able to ambulate 1000' without AD on level ground to improve walking endurance    Baseline ambulates with RW for 5'    Time 3    Period Weeks    Status New    Target Date 02/29/20      PT SHORT TERM GOAL #2   Title Patient will be able to perform 5 sit to stands under 20 seconds to improve functional strength    Baseline 28 sec (eval); 23 sec (03/28/20)    Time 4    Period Weeks    Status Revised    Target Date 02/29/20      PT SHORT TERM GOAL #3   Title Patient will be able to able to go up and down stairs with holding 5lb dumbell in one hand and rail on other hand with alternating gait.    Baseline able to go up/down with one rail and alternating steps; able to perform 12 steps with carrying 5lb in hand and one rail to mimich stair/rail at home (03/28/20)    Time 3    Period Weeks    Status Achieved    Target Date 02/29/20      PT SHORT TERM GOAL #4   Title Patient will be able to walk for 20 min with LRAD to improve walking endurance.    Baseline 5-10 min with 2 [employer]    Time 3    Period Weeks    Status New    Target Date 02/29/20      PT SHORT TERM GOAL #5   Title Pt will be able to perform floor transfers with SBA only    Baseline not attempted    Time 3    Period Weeks    Status New    Target Date 02/29/20             PT Long Term Goals - 03/29/20 1256      PT LONG TERM GOAL #1   Title Pt will be able to ambulate for 45 min without AD to improve walking endurance and return to PLOF    Baseline 10 min with RW (eval); 20 min with RW (03/29/20)    Time 8    Period Weeks    Status Revised    Target Date 05/24/20      PT LONG TERM GOAL #2   Title Pt will be  able to go up  and down stairs with carrying 5 lb box in both hands to improve stair negotiation    Baseline one rail, alterating steps, doesn't feel safe carrying things (eval); 5lbs one rail (03/29/20)    Period Weeks    Status Achieved      PT LONG TERM GOAL #3   Title Patient will score 25/30 or better Functiona gait assessment to improve overall gait    Baseline 21/30 (03/28/20)    Time 6    Period Weeks    Status Revised                 Plan - 03/31/20 1419    Clinical Impression Statement Pt's BP was WNL. Pt had low HgB today so pt was given adequate rest in between.    Personal Factors and Comorbidities Comorbidity 3+    Comorbidities PMHx significant for thrombocytopenia, epistaxis, monocytosis, HLD, MI s/p DES, CMML2, myeloblastic syndrome, AML, subdural hematoma    Examination-Activity Limitations Carry;Lift;Stand;Stairs;Squat    Examination-Participation Restrictions Cleaning;Community Activity;Driving;Laundry;Meal Prep;Shop;Yard Work    Stability/Clinical Decision Making Evolving/Moderate complexity    Rehab Potential Good    PT Frequency 2x / week    PT Duration 8 weeks    PT Treatment/Interventions ADLs/Self Care Home Management;Gait training;Stair training;Functional mobility training;Therapeutic activities;Therapeutic exercise;Balance training;Neuromuscular re-education;Manual techniques;Patient/family education    PT Next Visit Plan New chemo (new chemo meds) starts 03/13/20- watch vitals carefully           Patient will benefit from skilled therapeutic intervention in order to improve the following deficits and impairments:  Abnormal gait, Decreased balance, Decreased activity tolerance, Decreased mobility, Decreased endurance, Decreased safety awareness, Decreased strength, Difficulty walking  Visit Diagnosis: Muscle weakness (generalized)  Unsteadiness on feet  Subdural hematoma ([facility])  Balance disorder     Problem List Patient Active Problem List    Diagnosis Date Noted  . Elevated TSH 10/20/2017  . Monocytosis relative  10/20/2017  . Elevated LFTs 12/16/2015  . Testicular nodule vs cyst 12/10/2013  . Visit for preventive health examination 04/29/2012  . Leukopenia 05/14/2011  . Rosacea 05/14/2011  . Unspecified adverse effect of unspecified drug, medicinal and biological substance 10/19/2010  . Thrombocytopenia ([place]) 10/12/2010  . Hyperlipidemia   . HYPOGONADISM 02/05/2010  . Coronary atherosclerosis 06/13/2009  . POSTSURG PERCUT TRANSLUMINAL COR ANGPLSTY STS 06/13/2009  . ERECTILE DYSFUNCTION, MILD 06/29/2008  . HYPERTRIGLYCERIDEMIA 06/23/2007  . HYPERLIPIDEMIA 06/23/2007  . LOW HDL 06/23/2007    Kerrie Pleasure, PT 03/31/2020, 3:12 PM  [facility] [address] [place], [place], [address] Phone: [phone]   Fax:  [phone]  Name: SARP VERNIER MRN: [id number] Date of Birth: 1950/09/05

## 2020-04-03 ENCOUNTER — Ambulatory Visit: Payer: Medicare PPO

## 2020-04-03 DIAGNOSIS — C92A0 Acute myeloid leukemia with multilineage dysplasia, not having achieved remission: Secondary | ICD-10-CM | POA: Diagnosis not present

## 2020-04-03 DIAGNOSIS — R509 Fever, unspecified: Secondary | ICD-10-CM | POA: Diagnosis not present

## 2020-04-03 DIAGNOSIS — L89302 Pressure ulcer of unspecified buttock, stage 2: Secondary | ICD-10-CM | POA: Diagnosis not present

## 2020-04-03 DIAGNOSIS — R5383 Other fatigue: Secondary | ICD-10-CM | POA: Diagnosis not present

## 2020-04-03 DIAGNOSIS — R000 Tachycardia, unspecified: Secondary | ICD-10-CM | POA: Diagnosis not present

## 2020-04-03 DIAGNOSIS — I491 Atrial premature depolarization: Secondary | ICD-10-CM | POA: Diagnosis not present

## 2020-04-03 DIAGNOSIS — E870 Hyperosmolality and hypernatremia: Secondary | ICD-10-CM | POA: Diagnosis not present

## 2020-04-03 DIAGNOSIS — E876 Hypokalemia: Secondary | ICD-10-CM | POA: Diagnosis not present

## 2020-04-03 DIAGNOSIS — D61810 Antineoplastic chemotherapy induced pancytopenia: Secondary | ICD-10-CM | POA: Diagnosis not present

## 2020-04-03 DIAGNOSIS — R5081 Fever presenting with conditions classified elsewhere: Secondary | ICD-10-CM | POA: Diagnosis not present

## 2020-04-03 DIAGNOSIS — E785 Hyperlipidemia, unspecified: Secondary | ICD-10-CM | POA: Diagnosis not present

## 2020-04-03 DIAGNOSIS — R1310 Dysphagia, unspecified: Secondary | ICD-10-CM | POA: Diagnosis not present

## 2020-04-03 DIAGNOSIS — Z87820 Personal history of traumatic brain injury: Secondary | ICD-10-CM | POA: Diagnosis not present

## 2020-04-03 DIAGNOSIS — D649 Anemia, unspecified: Secondary | ICD-10-CM | POA: Diagnosis not present

## 2020-04-03 DIAGNOSIS — R197 Diarrhea, unspecified: Secondary | ICD-10-CM | POA: Diagnosis not present

## 2020-04-03 DIAGNOSIS — Z20822 Contact with and (suspected) exposure to covid-19: Secondary | ICD-10-CM | POA: Diagnosis not present

## 2020-04-03 DIAGNOSIS — D709 Neutropenia, unspecified: Secondary | ICD-10-CM | POA: Diagnosis not present

## 2020-04-03 DIAGNOSIS — C9200 Acute myeloblastic leukemia, not having achieved remission: Secondary | ICD-10-CM | POA: Diagnosis not present

## 2020-04-03 DIAGNOSIS — Z856 Personal history of leukemia: Secondary | ICD-10-CM | POA: Diagnosis not present

## 2020-04-03 DIAGNOSIS — D696 Thrombocytopenia, unspecified: Secondary | ICD-10-CM | POA: Diagnosis not present

## 2020-04-03 DIAGNOSIS — J189 Pneumonia, unspecified organism: Secondary | ICD-10-CM | POA: Diagnosis not present

## 2020-04-04 ENCOUNTER — Ambulatory Visit: Payer: Medicare PPO | Admitting: Speech Pathology

## 2020-04-04 ENCOUNTER — Ambulatory Visit: Payer: Medicare PPO | Admitting: Occupational Therapy

## 2020-04-04 LAB — VITAMIN B12: Vitamin B-12: 140

## 2020-04-06 ENCOUNTER — Encounter: Payer: Medicare PPO | Admitting: Occupational Therapy

## 2020-04-07 ENCOUNTER — Ambulatory Visit: Payer: Medicare PPO

## 2020-04-10 ENCOUNTER — Ambulatory Visit: Payer: Medicare PPO

## 2020-04-10 DIAGNOSIS — C9200 Acute myeloblastic leukemia, not having achieved remission: Secondary | ICD-10-CM | POA: Diagnosis not present

## 2020-04-11 DIAGNOSIS — Z5111 Encounter for antineoplastic chemotherapy: Secondary | ICD-10-CM | POA: Diagnosis not present

## 2020-04-11 DIAGNOSIS — C9200 Acute myeloblastic leukemia, not having achieved remission: Secondary | ICD-10-CM | POA: Diagnosis not present

## 2020-04-12 ENCOUNTER — Ambulatory Visit: Payer: Medicare PPO

## 2020-04-12 ENCOUNTER — Ambulatory Visit: Payer: Medicare PPO | Admitting: Occupational Therapy

## 2020-04-12 DIAGNOSIS — C9200 Acute myeloblastic leukemia, not having achieved remission: Secondary | ICD-10-CM | POA: Diagnosis not present

## 2020-04-12 DIAGNOSIS — Z5111 Encounter for antineoplastic chemotherapy: Secondary | ICD-10-CM | POA: Diagnosis not present

## 2020-04-13 DIAGNOSIS — Z5111 Encounter for antineoplastic chemotherapy: Secondary | ICD-10-CM | POA: Diagnosis not present

## 2020-04-13 DIAGNOSIS — R1310 Dysphagia, unspecified: Secondary | ICD-10-CM | POA: Diagnosis not present

## 2020-04-13 DIAGNOSIS — T451X5A Adverse effect of antineoplastic and immunosuppressive drugs, initial encounter: Secondary | ICD-10-CM | POA: Diagnosis not present

## 2020-04-13 DIAGNOSIS — Z95828 Presence of other vascular implants and grafts: Secondary | ICD-10-CM | POA: Diagnosis not present

## 2020-04-13 DIAGNOSIS — D709 Neutropenia, unspecified: Secondary | ICD-10-CM | POA: Diagnosis not present

## 2020-04-13 DIAGNOSIS — D61810 Antineoplastic chemotherapy induced pancytopenia: Secondary | ICD-10-CM | POA: Diagnosis not present

## 2020-04-13 DIAGNOSIS — C9200 Acute myeloblastic leukemia, not having achieved remission: Secondary | ICD-10-CM | POA: Diagnosis not present

## 2020-04-14 ENCOUNTER — Ambulatory Visit: Payer: Medicare PPO | Admitting: Occupational Therapy

## 2020-04-14 ENCOUNTER — Ambulatory Visit: Payer: Medicare PPO

## 2020-04-14 DIAGNOSIS — Z5111 Encounter for antineoplastic chemotherapy: Secondary | ICD-10-CM | POA: Diagnosis not present

## 2020-04-14 DIAGNOSIS — C9200 Acute myeloblastic leukemia, not having achieved remission: Secondary | ICD-10-CM | POA: Diagnosis not present

## 2020-04-15 DIAGNOSIS — C9200 Acute myeloblastic leukemia, not having achieved remission: Secondary | ICD-10-CM | POA: Diagnosis not present

## 2020-04-15 DIAGNOSIS — Z5111 Encounter for antineoplastic chemotherapy: Secondary | ICD-10-CM | POA: Diagnosis not present

## 2020-04-17 DIAGNOSIS — Z5111 Encounter for antineoplastic chemotherapy: Secondary | ICD-10-CM | POA: Diagnosis not present

## 2020-04-17 DIAGNOSIS — C9200 Acute myeloblastic leukemia, not having achieved remission: Secondary | ICD-10-CM | POA: Diagnosis not present

## 2020-04-18 DIAGNOSIS — Z5111 Encounter for antineoplastic chemotherapy: Secondary | ICD-10-CM | POA: Diagnosis not present

## 2020-04-18 DIAGNOSIS — C9200 Acute myeloblastic leukemia, not having achieved remission: Secondary | ICD-10-CM | POA: Diagnosis not present

## 2020-04-19 DIAGNOSIS — Z5111 Encounter for antineoplastic chemotherapy: Secondary | ICD-10-CM | POA: Diagnosis not present

## 2020-04-19 DIAGNOSIS — C9200 Acute myeloblastic leukemia, not having achieved remission: Secondary | ICD-10-CM | POA: Diagnosis not present

## 2020-04-20 DIAGNOSIS — R0683 Snoring: Secondary | ICD-10-CM | POA: Diagnosis not present

## 2020-04-20 DIAGNOSIS — C9480 Other specified leukemias not having achieved remission: Secondary | ICD-10-CM | POA: Diagnosis not present

## 2020-04-20 DIAGNOSIS — E785 Hyperlipidemia, unspecified: Secondary | ICD-10-CM | POA: Diagnosis not present

## 2020-04-20 DIAGNOSIS — D709 Neutropenia, unspecified: Secondary | ICD-10-CM | POA: Diagnosis not present

## 2020-04-20 DIAGNOSIS — R05 Cough: Secondary | ICD-10-CM | POA: Diagnosis not present

## 2020-04-20 DIAGNOSIS — R0989 Other specified symptoms and signs involving the circulatory and respiratory systems: Secondary | ICD-10-CM | POA: Diagnosis not present

## 2020-04-20 DIAGNOSIS — R5081 Fever presenting with conditions classified elsewhere: Secondary | ICD-10-CM | POA: Diagnosis not present

## 2020-04-20 DIAGNOSIS — D704 Cyclic neutropenia: Secondary | ICD-10-CM | POA: Diagnosis not present

## 2020-04-20 DIAGNOSIS — L988 Other specified disorders of the skin and subcutaneous tissue: Secondary | ICD-10-CM | POA: Diagnosis not present

## 2020-04-20 DIAGNOSIS — C9200 Acute myeloblastic leukemia, not having achieved remission: Secondary | ICD-10-CM | POA: Diagnosis not present

## 2020-04-20 DIAGNOSIS — J188 Other pneumonia, unspecified organism: Secondary | ICD-10-CM | POA: Diagnosis not present

## 2020-04-20 DIAGNOSIS — Z95828 Presence of other vascular implants and grafts: Secondary | ICD-10-CM | POA: Diagnosis not present

## 2020-04-20 DIAGNOSIS — D61818 Other pancytopenia: Secondary | ICD-10-CM | POA: Diagnosis not present

## 2020-04-22 DIAGNOSIS — C9200 Acute myeloblastic leukemia, not having achieved remission: Secondary | ICD-10-CM | POA: Diagnosis not present

## 2020-04-24 DIAGNOSIS — C9200 Acute myeloblastic leukemia, not having achieved remission: Secondary | ICD-10-CM | POA: Diagnosis not present

## 2020-04-27 DIAGNOSIS — R5383 Other fatigue: Secondary | ICD-10-CM | POA: Diagnosis not present

## 2020-04-27 DIAGNOSIS — Z79899 Other long term (current) drug therapy: Secondary | ICD-10-CM | POA: Diagnosis not present

## 2020-04-27 DIAGNOSIS — C9200 Acute myeloblastic leukemia, not having achieved remission: Secondary | ICD-10-CM | POA: Diagnosis not present

## 2020-04-27 DIAGNOSIS — E785 Hyperlipidemia, unspecified: Secondary | ICD-10-CM | POA: Diagnosis not present

## 2020-04-27 DIAGNOSIS — D61818 Other pancytopenia: Secondary | ICD-10-CM | POA: Diagnosis not present

## 2020-04-27 DIAGNOSIS — Z9889 Other specified postprocedural states: Secondary | ICD-10-CM | POA: Diagnosis not present

## 2020-04-29 DIAGNOSIS — C9200 Acute myeloblastic leukemia, not having achieved remission: Secondary | ICD-10-CM | POA: Diagnosis not present

## 2020-05-01 DIAGNOSIS — C9200 Acute myeloblastic leukemia, not having achieved remission: Secondary | ICD-10-CM | POA: Diagnosis not present

## 2020-05-01 DIAGNOSIS — R05 Cough: Secondary | ICD-10-CM | POA: Diagnosis not present

## 2020-05-02 DIAGNOSIS — J385 Laryngeal spasm: Secondary | ICD-10-CM | POA: Diagnosis not present

## 2020-05-02 DIAGNOSIS — R490 Dysphonia: Secondary | ICD-10-CM | POA: Diagnosis not present

## 2020-05-02 DIAGNOSIS — J383 Other diseases of vocal cords: Secondary | ICD-10-CM | POA: Diagnosis not present

## 2020-05-04 DIAGNOSIS — R509 Fever, unspecified: Secondary | ICD-10-CM | POA: Diagnosis not present

## 2020-05-04 DIAGNOSIS — L7622 Postprocedural hemorrhage and hematoma of skin and subcutaneous tissue following other procedure: Secondary | ICD-10-CM | POA: Diagnosis not present

## 2020-05-04 DIAGNOSIS — D61810 Antineoplastic chemotherapy induced pancytopenia: Secondary | ICD-10-CM | POA: Diagnosis not present

## 2020-05-04 DIAGNOSIS — Z4659 Encounter for fitting and adjustment of other gastrointestinal appliance and device: Secondary | ICD-10-CM | POA: Diagnosis not present

## 2020-05-04 DIAGNOSIS — T451X5A Adverse effect of antineoplastic and immunosuppressive drugs, initial encounter: Secondary | ICD-10-CM | POA: Diagnosis not present

## 2020-05-04 DIAGNOSIS — C9310 Chronic myelomonocytic leukemia not having achieved remission: Secondary | ICD-10-CM | POA: Diagnosis not present

## 2020-05-04 DIAGNOSIS — R1084 Generalized abdominal pain: Secondary | ICD-10-CM | POA: Diagnosis not present

## 2020-05-04 DIAGNOSIS — R633 Feeding difficulties: Secondary | ICD-10-CM | POA: Diagnosis not present

## 2020-05-04 DIAGNOSIS — E785 Hyperlipidemia, unspecified: Secondary | ICD-10-CM | POA: Diagnosis not present

## 2020-05-04 DIAGNOSIS — E43 Unspecified severe protein-calorie malnutrition: Secondary | ICD-10-CM | POA: Diagnosis not present

## 2020-05-04 DIAGNOSIS — R1312 Dysphagia, oropharyngeal phase: Secondary | ICD-10-CM | POA: Diagnosis not present

## 2020-05-04 DIAGNOSIS — K660 Peritoneal adhesions (postprocedural) (postinfection): Secondary | ICD-10-CM | POA: Diagnosis not present

## 2020-05-04 DIAGNOSIS — D709 Neutropenia, unspecified: Secondary | ICD-10-CM | POA: Diagnosis not present

## 2020-05-04 DIAGNOSIS — E538 Deficiency of other specified B group vitamins: Secondary | ICD-10-CM | POA: Diagnosis not present

## 2020-05-04 DIAGNOSIS — R140 Abdominal distension (gaseous): Secondary | ICD-10-CM | POA: Diagnosis not present

## 2020-05-04 DIAGNOSIS — R05 Cough: Secondary | ICD-10-CM | POA: Diagnosis not present

## 2020-05-04 DIAGNOSIS — K56609 Unspecified intestinal obstruction, unspecified as to partial versus complete obstruction: Secondary | ICD-10-CM | POA: Diagnosis not present

## 2020-05-04 DIAGNOSIS — R110 Nausea: Secondary | ICD-10-CM | POA: Diagnosis not present

## 2020-05-04 DIAGNOSIS — C92A0 Acute myeloid leukemia with multilineage dysplasia, not having achieved remission: Secondary | ICD-10-CM | POA: Diagnosis not present

## 2020-05-04 DIAGNOSIS — B4489 Other forms of aspergillosis: Secondary | ICD-10-CM | POA: Diagnosis not present

## 2020-05-04 DIAGNOSIS — R000 Tachycardia, unspecified: Secondary | ICD-10-CM | POA: Diagnosis not present

## 2020-05-04 DIAGNOSIS — K219 Gastro-esophageal reflux disease without esophagitis: Secondary | ICD-10-CM | POA: Diagnosis not present

## 2020-05-04 DIAGNOSIS — R112 Nausea with vomiting, unspecified: Secondary | ICD-10-CM | POA: Diagnosis not present

## 2020-05-04 DIAGNOSIS — D696 Thrombocytopenia, unspecified: Secondary | ICD-10-CM | POA: Diagnosis not present

## 2020-05-04 DIAGNOSIS — Z932 Ileostomy status: Secondary | ICD-10-CM | POA: Diagnosis not present

## 2020-05-04 DIAGNOSIS — Z66 Do not resuscitate: Secondary | ICD-10-CM | POA: Diagnosis not present

## 2020-05-04 DIAGNOSIS — B479 Mycetoma, unspecified: Secondary | ICD-10-CM | POA: Diagnosis not present

## 2020-05-04 DIAGNOSIS — K5289 Other specified noninfective gastroenteritis and colitis: Secondary | ICD-10-CM | POA: Diagnosis not present

## 2020-05-04 DIAGNOSIS — K56699 Other intestinal obstruction unspecified as to partial versus complete obstruction: Secondary | ICD-10-CM | POA: Diagnosis not present

## 2020-05-04 DIAGNOSIS — R1111 Vomiting without nausea: Secondary | ICD-10-CM | POA: Diagnosis not present

## 2020-05-04 DIAGNOSIS — J9691 Respiratory failure, unspecified with hypoxia: Secondary | ICD-10-CM | POA: Diagnosis not present

## 2020-05-04 DIAGNOSIS — J690 Pneumonitis due to inhalation of food and vomit: Secondary | ICD-10-CM | POA: Diagnosis not present

## 2020-05-04 DIAGNOSIS — R0689 Other abnormalities of breathing: Secondary | ICD-10-CM | POA: Diagnosis not present

## 2020-05-04 DIAGNOSIS — K56601 Complete intestinal obstruction, unspecified as to cause: Secondary | ICD-10-CM | POA: Diagnosis not present

## 2020-05-04 DIAGNOSIS — R5081 Fever presenting with conditions classified elsewhere: Secondary | ICD-10-CM | POA: Diagnosis not present

## 2020-05-04 DIAGNOSIS — Z8679 Personal history of other diseases of the circulatory system: Secondary | ICD-10-CM | POA: Diagnosis not present

## 2020-05-04 DIAGNOSIS — Z856 Personal history of leukemia: Secondary | ICD-10-CM | POA: Diagnosis not present

## 2020-05-04 DIAGNOSIS — L89312 Pressure ulcer of right buttock, stage 2: Secondary | ICD-10-CM | POA: Diagnosis not present

## 2020-05-04 DIAGNOSIS — D61818 Other pancytopenia: Secondary | ICD-10-CM | POA: Diagnosis not present

## 2020-05-04 DIAGNOSIS — R0902 Hypoxemia: Secondary | ICD-10-CM | POA: Diagnosis not present

## 2020-05-04 DIAGNOSIS — I6200 Nontraumatic subdural hemorrhage, unspecified: Secondary | ICD-10-CM | POA: Diagnosis not present

## 2020-05-04 DIAGNOSIS — K5669 Other partial intestinal obstruction: Secondary | ICD-10-CM | POA: Diagnosis not present

## 2020-05-04 DIAGNOSIS — Z743 Need for continuous supervision: Secondary | ICD-10-CM | POA: Diagnosis not present

## 2020-05-04 DIAGNOSIS — D849 Immunodeficiency, unspecified: Secondary | ICD-10-CM | POA: Diagnosis not present

## 2020-05-04 DIAGNOSIS — K56600 Partial intestinal obstruction, unspecified as to cause: Secondary | ICD-10-CM | POA: Diagnosis not present

## 2020-05-04 DIAGNOSIS — K668 Other specified disorders of peritoneum: Secondary | ICD-10-CM | POA: Diagnosis not present

## 2020-05-04 DIAGNOSIS — R1110 Vomiting, unspecified: Secondary | ICD-10-CM | POA: Diagnosis not present

## 2020-05-04 DIAGNOSIS — C9590 Leukemia, unspecified not having achieved remission: Secondary | ICD-10-CM | POA: Diagnosis not present

## 2020-05-04 DIAGNOSIS — C9200 Acute myeloblastic leukemia, not having achieved remission: Secondary | ICD-10-CM | POA: Diagnosis not present

## 2020-05-04 DIAGNOSIS — R918 Other nonspecific abnormal finding of lung field: Secondary | ICD-10-CM | POA: Diagnosis not present

## 2020-05-04 DIAGNOSIS — Z515 Encounter for palliative care: Secondary | ICD-10-CM | POA: Diagnosis not present

## 2020-05-04 DIAGNOSIS — R1013 Epigastric pain: Secondary | ICD-10-CM | POA: Diagnosis not present

## 2020-05-04 DIAGNOSIS — R531 Weakness: Secondary | ICD-10-CM | POA: Diagnosis not present

## 2020-05-04 DIAGNOSIS — D703 Neutropenia due to infection: Secondary | ICD-10-CM | POA: Diagnosis not present

## 2020-05-04 DIAGNOSIS — E8809 Other disorders of plasma-protein metabolism, not elsewhere classified: Secondary | ICD-10-CM | POA: Diagnosis not present

## 2020-05-04 DIAGNOSIS — R5383 Other fatigue: Secondary | ICD-10-CM | POA: Diagnosis not present

## 2020-05-04 DIAGNOSIS — K669 Disorder of peritoneum, unspecified: Secondary | ICD-10-CM | POA: Diagnosis not present

## 2020-05-04 DIAGNOSIS — L988 Other specified disorders of the skin and subcutaneous tissue: Secondary | ICD-10-CM | POA: Diagnosis not present

## 2020-05-04 DIAGNOSIS — R630 Anorexia: Secondary | ICD-10-CM | POA: Diagnosis not present

## 2020-05-04 DIAGNOSIS — Z20822 Contact with and (suspected) exposure to covid-19: Secondary | ICD-10-CM | POA: Diagnosis not present

## 2020-05-04 DIAGNOSIS — C9591 Leukemia, unspecified, in remission: Secondary | ICD-10-CM | POA: Diagnosis not present

## 2020-05-04 DIAGNOSIS — K6389 Other specified diseases of intestine: Secondary | ICD-10-CM | POA: Diagnosis not present

## 2020-05-04 DIAGNOSIS — R279 Unspecified lack of coordination: Secondary | ICD-10-CM | POA: Diagnosis not present

## 2020-05-07 DIAGNOSIS — Z20822 Contact with and (suspected) exposure to covid-19: Secondary | ICD-10-CM | POA: Diagnosis not present

## 2020-05-07 DIAGNOSIS — R112 Nausea with vomiting, unspecified: Secondary | ICD-10-CM | POA: Diagnosis not present

## 2020-05-07 DIAGNOSIS — R531 Weakness: Secondary | ICD-10-CM | POA: Diagnosis not present

## 2020-05-07 DIAGNOSIS — R918 Other nonspecific abnormal finding of lung field: Secondary | ICD-10-CM | POA: Diagnosis not present

## 2020-05-07 DIAGNOSIS — R000 Tachycardia, unspecified: Secondary | ICD-10-CM | POA: Diagnosis not present

## 2020-05-07 DIAGNOSIS — K56600 Partial intestinal obstruction, unspecified as to cause: Secondary | ICD-10-CM | POA: Diagnosis not present

## 2020-05-07 DIAGNOSIS — K56699 Other intestinal obstruction unspecified as to partial versus complete obstruction: Secondary | ICD-10-CM | POA: Diagnosis not present

## 2020-05-07 DIAGNOSIS — R5383 Other fatigue: Secondary | ICD-10-CM | POA: Diagnosis not present

## 2020-05-07 DIAGNOSIS — R1013 Epigastric pain: Secondary | ICD-10-CM | POA: Diagnosis not present

## 2020-05-07 DIAGNOSIS — R1110 Vomiting, unspecified: Secondary | ICD-10-CM | POA: Diagnosis not present

## 2020-07-03 DEATH — deceased

## 2020-07-12 IMAGING — CT CT BIOPSY AND ASPIRATION BONE MARROW
1 of 4 series · 11 of 30 positions shown, 14 images · non-contrast
Comparison: none

INDICATION: Thrombocytopenia and leukocytosis. Please perform CT-guided bone
marrow biopsy to evaluate for myelodysplastic syndrome.

[Series 2: i-spiral 5.0 b40f · axial · 0.98mm/px · z∈[+1140,+1270]mm · 11 of 47 slices shown, 14 images]
[im 5/47  mediastinal]
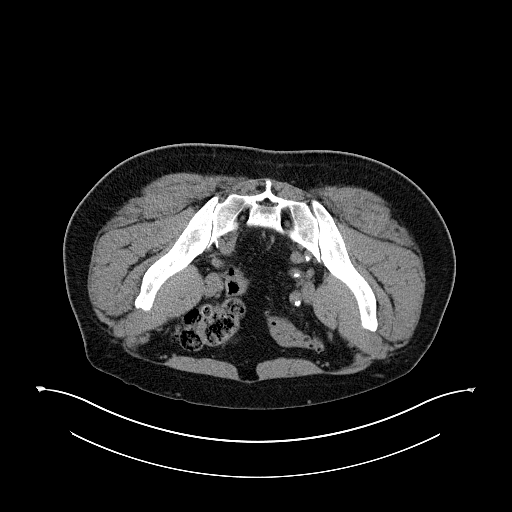
[im 5/47  lung]
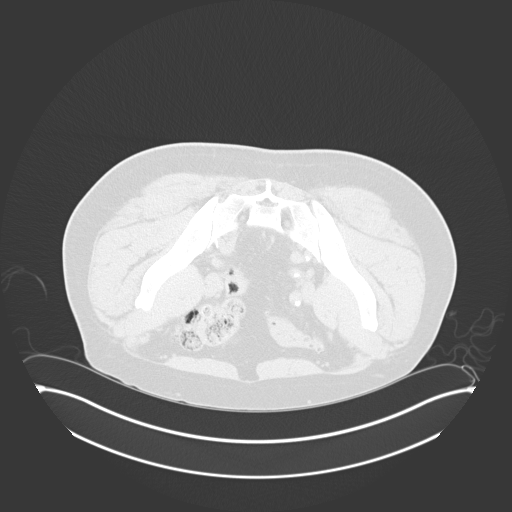
[im 9/47  lung]
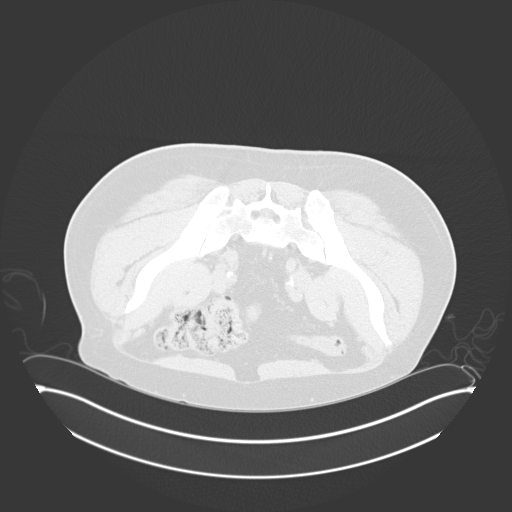
[im 13/47  lung]
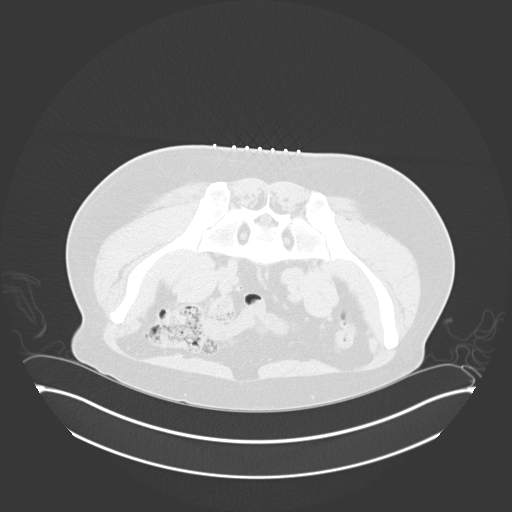
[im 17/47  lung]
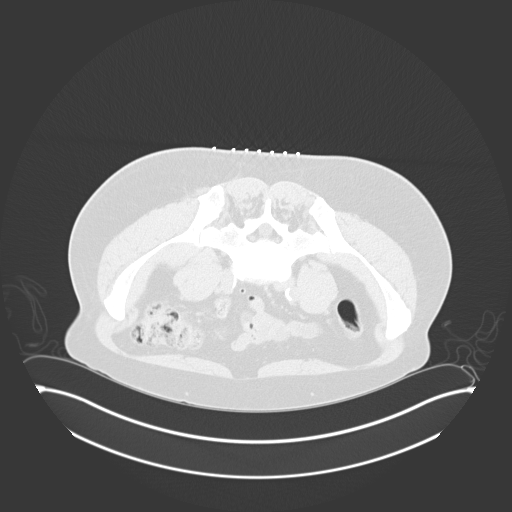
[im 21/47  mediastinal]
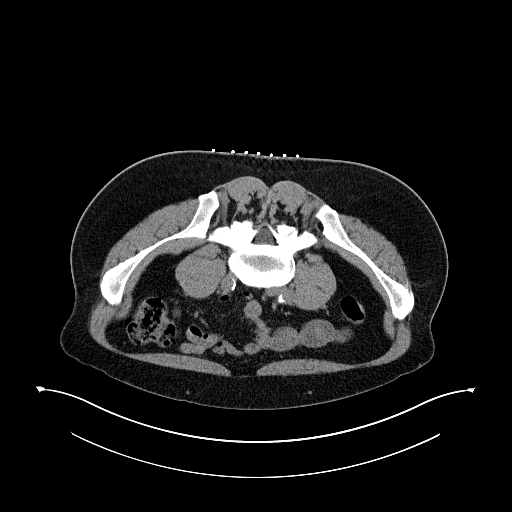
[im 21/47  lung]
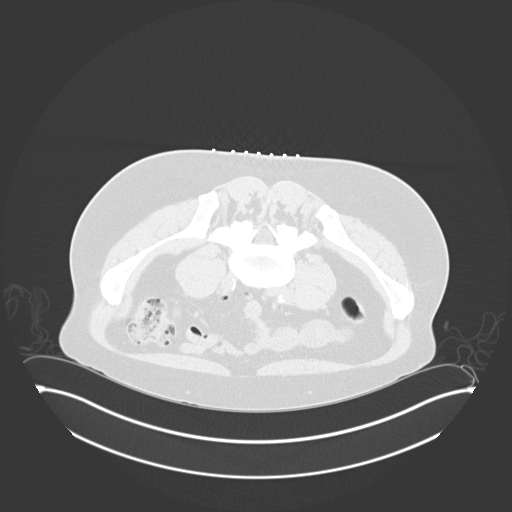
[im 22/47  lung]
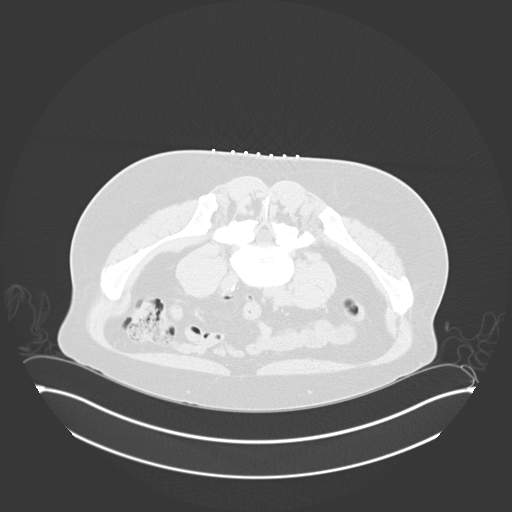
[im 26/47  lung]
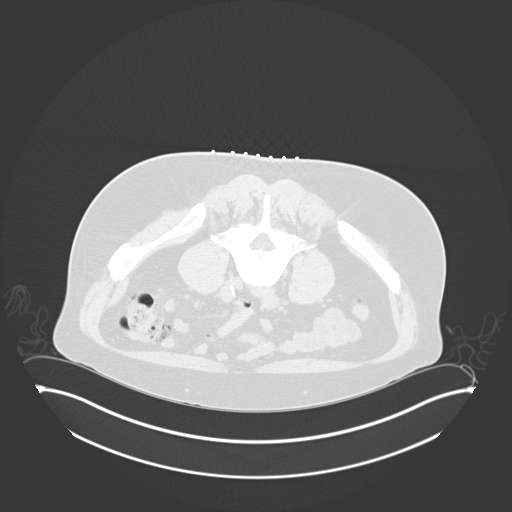
[im 30/47  lung]
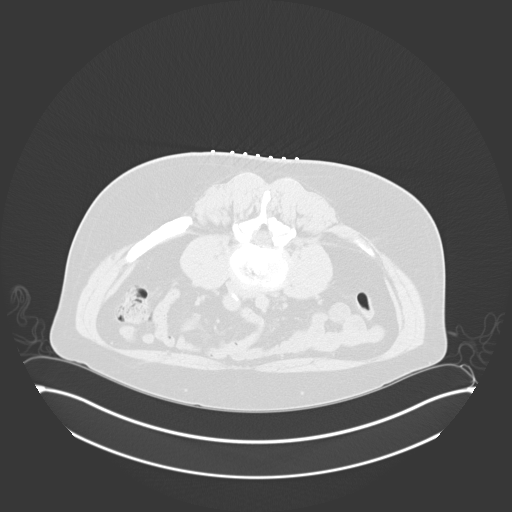
[im 34/47  mediastinal]
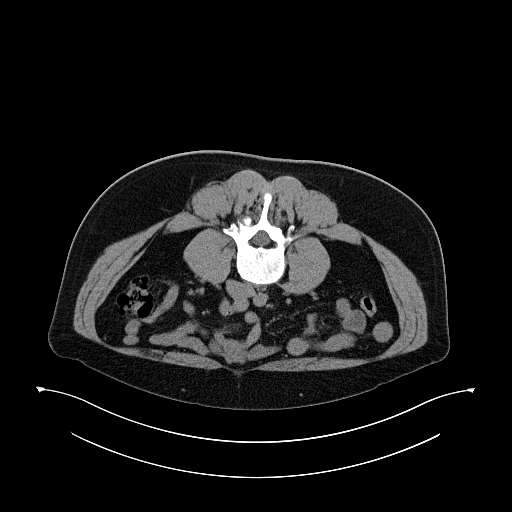
[im 34/47  lung]
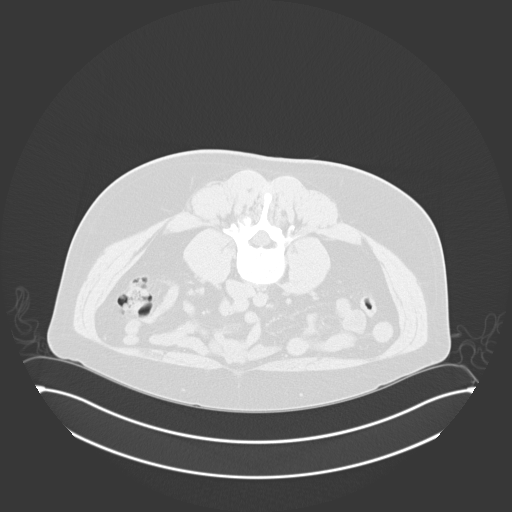
[im 38/47  lung]
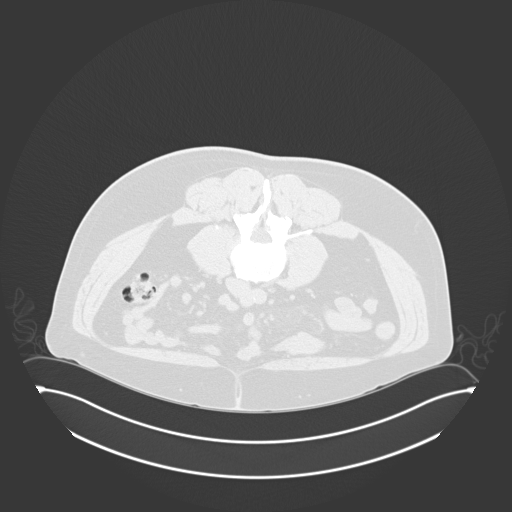
[im 42/47  lung]
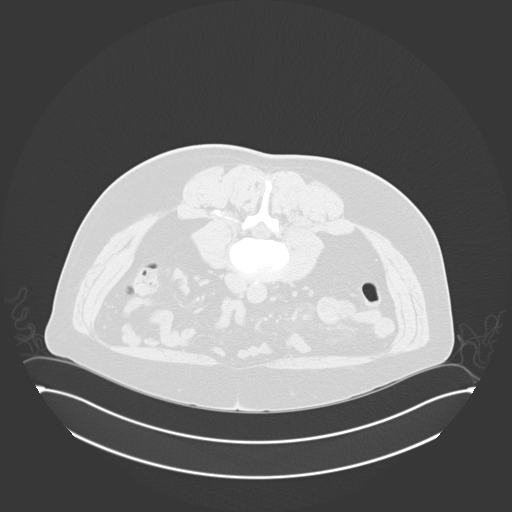

[11 of 30 positions shown; findings below may reference images not displayed]

EXAM:
CT-GUIDED BONE MARROW BIOPSY AND ASPIRATION

MEDICATIONS:
None

ANESTHESIA/SEDATION:
Fentanyl 50 mcg IV

Sedation Time: 10 Minutes; The patient was continuously monitored
during the procedure by the interventional radiology nurse under my
direct supervision.

COMPLICATIONS:
None immediate.

PROCEDURE:
Informed consent was obtained from the patient following an
explanation of the procedure, risks, benefits and alternatives. The
patient understands, agrees and consents for the procedure. All
questions were addressed. A time out was performed prior to the
initiation of the procedure. The patient was positioned prone and
non-contrast localization CT was performed of the pelvis to
demonstrate the iliac marrow spaces. The operative site was prepped
and draped in the usual sterile fashion.

Under sterile conditions and local anesthesia, a 22 gauge spinal
needle was utilized for procedural planning. Next, an 11 gauge
coaxial bone biopsy needle was advanced into the left iliac marrow
space. Needle position was confirmed with CT imaging. Initially,
bone marrow aspiration was performed. Next, a bone marrow biopsy was
obtained with the 11 gauge outer bone marrow device. Samples were
prepared with the cytotechnologist and deemed adequate. The needle
was removed intact. Hemostasis was obtained with compression and a
dressing was placed. The patient tolerated the procedure well
without immediate post procedural complication.
IMPRESSION: Successful CT guided left iliac bone marrow aspiration and core
biopsy.
